# Patient Record
Sex: Female | Born: 1960 | Race: White | Hispanic: No | Smoking: Current every day smoker
Health system: Southern US, Community
[De-identification: ages and names within clinical notes are randomized; demographics above are authoritative.]

## PROBLEM LIST (undated history)

## (undated) DIAGNOSIS — F419 Anxiety disorder, unspecified: Secondary | ICD-10-CM

## (undated) DIAGNOSIS — M5090 Cervical disc disorder, unspecified, unspecified cervical region: Secondary | ICD-10-CM

## (undated) DIAGNOSIS — T7840XA Allergy, unspecified, initial encounter: Secondary | ICD-10-CM

## (undated) DIAGNOSIS — G5600 Carpal tunnel syndrome, unspecified upper limb: Secondary | ICD-10-CM

## (undated) DIAGNOSIS — E785 Hyperlipidemia, unspecified: Secondary | ICD-10-CM

## (undated) DIAGNOSIS — F3281 Premenstrual dysphoric disorder: Secondary | ICD-10-CM

## (undated) DIAGNOSIS — K219 Gastro-esophageal reflux disease without esophagitis: Secondary | ICD-10-CM

## (undated) DIAGNOSIS — I10 Essential (primary) hypertension: Secondary | ICD-10-CM

## (undated) HISTORY — DX: Essential (primary) hypertension: I10

## (undated) HISTORY — DX: Hyperlipidemia, unspecified: E78.5

## (undated) HISTORY — DX: Cervical disc disorder, unspecified, unspecified cervical region: M50.90

## (undated) HISTORY — PX: COLONOSCOPY: SHX174

## (undated) HISTORY — PX: PILONIDAL CYST EXCISION: SHX744

## (undated) HISTORY — DX: Allergy, unspecified, initial encounter: T78.40XA

## (undated) HISTORY — DX: Gastro-esophageal reflux disease without esophagitis: K21.9

## (undated) HISTORY — DX: Premenstrual dysphoric disorder: F32.81

## (undated) HISTORY — DX: Anxiety disorder, unspecified: F41.9

## (undated) HISTORY — PX: SPINE SURGERY: SHX786

## (undated) HISTORY — DX: Carpal tunnel syndrome, unspecified upper limb: G56.00

## (undated) HISTORY — PX: BREAST BIOPSY: SHX20

## (undated) HISTORY — PX: POLYPECTOMY: SHX149

---

## 1989-09-17 HISTORY — PX: CRYOABLATION: SHX1415

## 1998-07-27 ENCOUNTER — Inpatient Hospital Stay (HOSPITAL_COMMUNITY): Admission: AD | Admit: 1998-07-27 | Discharge: 1998-07-29 | Payer: Self-pay | Admitting: *Deleted

## 1998-08-23 ENCOUNTER — Other Ambulatory Visit: Admission: RE | Admit: 1998-08-23 | Discharge: 1998-08-23 | Payer: Self-pay | Admitting: *Deleted

## 1999-12-13 ENCOUNTER — Other Ambulatory Visit: Admission: RE | Admit: 1999-12-13 | Discharge: 1999-12-13 | Payer: Self-pay | Admitting: *Deleted

## 2000-12-18 ENCOUNTER — Other Ambulatory Visit: Admission: RE | Admit: 2000-12-18 | Discharge: 2000-12-18 | Payer: Self-pay | Admitting: *Deleted

## 2002-02-07 ENCOUNTER — Other Ambulatory Visit: Admission: RE | Admit: 2002-02-07 | Discharge: 2002-02-07 | Payer: Self-pay | Admitting: *Deleted

## 2003-03-15 ENCOUNTER — Other Ambulatory Visit: Admission: RE | Admit: 2003-03-15 | Discharge: 2003-03-15 | Payer: Self-pay | Admitting: Internal Medicine

## 2004-11-17 HISTORY — PX: OTHER SURGICAL HISTORY: SHX169

## 2005-01-06 ENCOUNTER — Ambulatory Visit: Payer: Self-pay | Admitting: Family Medicine

## 2005-04-03 ENCOUNTER — Ambulatory Visit: Payer: Self-pay | Admitting: Family Medicine

## 2005-05-07 ENCOUNTER — Ambulatory Visit: Payer: Self-pay | Admitting: Family Medicine

## 2005-05-28 ENCOUNTER — Ambulatory Visit: Payer: Self-pay | Admitting: Internal Medicine

## 2005-06-09 ENCOUNTER — Encounter (INDEPENDENT_AMBULATORY_CARE_PROVIDER_SITE_OTHER): Payer: Self-pay | Admitting: *Deleted

## 2005-06-09 ENCOUNTER — Ambulatory Visit: Payer: Self-pay | Admitting: Internal Medicine

## 2005-06-23 ENCOUNTER — Ambulatory Visit: Payer: Self-pay | Admitting: Family Medicine

## 2005-09-11 ENCOUNTER — Ambulatory Visit: Payer: Self-pay | Admitting: Family Medicine

## 2006-01-27 ENCOUNTER — Ambulatory Visit: Payer: Self-pay | Admitting: Family Medicine

## 2006-02-10 ENCOUNTER — Ambulatory Visit: Payer: Self-pay | Admitting: Family Medicine

## 2006-03-13 ENCOUNTER — Ambulatory Visit: Payer: Self-pay | Admitting: Family Medicine

## 2006-03-27 ENCOUNTER — Ambulatory Visit: Payer: Self-pay | Admitting: Family Medicine

## 2006-06-29 ENCOUNTER — Other Ambulatory Visit: Admission: RE | Admit: 2006-06-29 | Discharge: 2006-06-29 | Payer: Self-pay | Admitting: Family Medicine

## 2006-06-29 ENCOUNTER — Ambulatory Visit: Payer: Self-pay | Admitting: Family Medicine

## 2006-06-29 LAB — CONVERTED CEMR LAB: Pap Smear: NORMAL

## 2006-07-17 ENCOUNTER — Ambulatory Visit: Payer: Self-pay | Admitting: Internal Medicine

## 2006-07-17 ENCOUNTER — Encounter: Payer: Self-pay | Admitting: Internal Medicine

## 2006-07-17 ENCOUNTER — Inpatient Hospital Stay (HOSPITAL_COMMUNITY): Admission: EM | Admit: 2006-07-17 | Discharge: 2006-07-19 | Payer: Self-pay | Admitting: Emergency Medicine

## 2006-07-24 ENCOUNTER — Ambulatory Visit: Payer: Self-pay | Admitting: Family Medicine

## 2006-07-24 ENCOUNTER — Ambulatory Visit: Payer: Self-pay | Admitting: Oncology

## 2006-07-30 LAB — CBC WITH DIFFERENTIAL (CANCER CENTER ONLY)
BASO#: 0.1 10*3/uL (ref 0.0–0.2)
HCT: 39.4 % (ref 34.8–46.6)
HGB: 13.2 g/dL (ref 11.6–15.9)
LYMPH#: 2.3 10*3/uL (ref 0.9–3.3)
LYMPH%: 31.2 % (ref 14.0–48.0)
MCHC: 33.4 g/dL (ref 32.0–36.0)
MCV: 83 fL (ref 81–101)
MONO#: 0.4 10*3/uL (ref 0.1–0.9)
NEUT%: 59.8 % (ref 39.6–80.0)
WBC: 7.4 10*3/uL (ref 3.9–10.0)

## 2006-08-04 LAB — HYPERCOAGULABLE PANEL, COMPREHENSIVE
AntiThromb III Func: 90 % (ref 75–120)
Anticardiolipin IgA: <7 [APL'U] (ref <13)
Anticardiolipin IgG: <7 [GPL'U] (ref <11)
Anticardiolipin IgM: <7 [MPL'U] (ref <10)
Beta-2 Glyco I IgG: <4 U/mL (ref <20)
Beta-2-Glycoprotein I IgA: <4 U/mL (ref <10)
Beta-2-Glycoprotein I IgM: <4 U/mL (ref <10)
PTT Lupus Anticoagulant: 39.2 secs (ref 36.3–48.8)
Protein C, Total: 87 % (ref 63–153)

## 2006-08-04 LAB — COMPREHENSIVE METABOLIC PANEL
ALT: 13 U/L (ref 0–40)
BUN: 8 mg/dL (ref 6–23)
CO2: 24 mEq/L (ref 19–32)
Calcium: 9.2 mg/dL (ref 8.4–10.5)
Chloride: 106 mEq/L (ref 96–112)
Creatinine, Ser: 0.7 mg/dL (ref 0.40–1.20)
Glucose, Bld: 93 mg/dL (ref 70–99)
Total Bilirubin: 0.4 mg/dL (ref 0.3–1.2)

## 2006-08-04 LAB — LACTATE DEHYDROGENASE: LDH: 160 U/L (ref 94–250)

## 2006-08-31 ENCOUNTER — Ambulatory Visit: Payer: Self-pay | Admitting: Family Medicine

## 2006-09-07 ENCOUNTER — Encounter: Admission: RE | Admit: 2006-09-07 | Discharge: 2006-09-07 | Payer: Self-pay | Admitting: Oncology

## 2006-09-11 ENCOUNTER — Ambulatory Visit: Payer: Self-pay | Admitting: Oncology

## 2006-09-22 ENCOUNTER — Ambulatory Visit: Payer: Self-pay | Admitting: Family Medicine

## 2006-09-30 ENCOUNTER — Ambulatory Visit: Payer: Self-pay | Admitting: Family Medicine

## 2006-10-28 ENCOUNTER — Ambulatory Visit: Payer: Self-pay | Admitting: Oncology

## 2006-10-30 ENCOUNTER — Ambulatory Visit: Payer: Self-pay | Admitting: Family Medicine

## 2006-11-09 ENCOUNTER — Ambulatory Visit: Payer: Self-pay | Admitting: Family Medicine

## 2006-12-26 ENCOUNTER — Encounter: Admission: RE | Admit: 2006-12-26 | Discharge: 2006-12-26 | Payer: Self-pay | Admitting: Family Medicine

## 2006-12-29 ENCOUNTER — Ambulatory Visit: Payer: Self-pay | Admitting: Family Medicine

## 2007-01-27 ENCOUNTER — Ambulatory Visit: Payer: Self-pay | Admitting: Oncology

## 2007-03-15 ENCOUNTER — Telehealth (INDEPENDENT_AMBULATORY_CARE_PROVIDER_SITE_OTHER): Payer: Self-pay | Admitting: *Deleted

## 2007-03-22 ENCOUNTER — Ambulatory Visit: Payer: Self-pay | Admitting: Oncology

## 2007-03-23 LAB — BASIC METABOLIC PANEL
CO2: 21 mEq/L (ref 19–32)
Calcium: 8.7 mg/dL (ref 8.4–10.5)
Glucose, Bld: 121 mg/dL — ABNORMAL HIGH (ref 70–99)
Potassium: 4.2 mEq/L (ref 3.5–5.3)
Sodium: 139 mEq/L (ref 135–145)

## 2007-03-25 ENCOUNTER — Encounter: Admission: RE | Admit: 2007-03-25 | Discharge: 2007-03-25 | Payer: Self-pay | Admitting: Oncology

## 2007-04-08 LAB — CBC WITH DIFFERENTIAL (CANCER CENTER ONLY)
BASO#: 0.1 10*3/uL (ref 0.0–0.2)
EOS%: 3 % (ref 0.0–7.0)
HCT: 41.6 % (ref 34.8–46.6)
HGB: 14.1 g/dL (ref 11.6–15.9)
LYMPH%: 37.3 % (ref 14.0–48.0)
MCH: 28.7 pg (ref 26.0–34.0)
MCHC: 34 g/dL (ref 32.0–36.0)
MONO%: 5.7 % (ref 0.0–13.0)
NEUT%: 53.2 % (ref 39.6–80.0)
RDW: 12.5 % (ref 10.5–14.6)

## 2007-08-19 ENCOUNTER — Ambulatory Visit: Payer: Self-pay | Admitting: Family Medicine

## 2007-08-19 DIAGNOSIS — G5600 Carpal tunnel syndrome, unspecified upper limb: Secondary | ICD-10-CM | POA: Insufficient documentation

## 2007-08-19 DIAGNOSIS — K649 Unspecified hemorrhoids: Secondary | ICD-10-CM | POA: Insufficient documentation

## 2007-08-19 DIAGNOSIS — I10 Essential (primary) hypertension: Secondary | ICD-10-CM | POA: Insufficient documentation

## 2007-08-19 DIAGNOSIS — Z87410 Personal history of cervical dysplasia: Secondary | ICD-10-CM | POA: Insufficient documentation

## 2007-08-19 DIAGNOSIS — Z87898 Personal history of other specified conditions: Secondary | ICD-10-CM | POA: Insufficient documentation

## 2007-08-19 DIAGNOSIS — F17200 Nicotine dependence, unspecified, uncomplicated: Secondary | ICD-10-CM | POA: Insufficient documentation

## 2007-08-20 ENCOUNTER — Encounter: Admission: RE | Admit: 2007-08-20 | Discharge: 2007-08-20 | Payer: Self-pay | Admitting: Family Medicine

## 2007-08-24 ENCOUNTER — Encounter: Admission: RE | Admit: 2007-08-24 | Discharge: 2007-08-24 | Payer: Self-pay | Admitting: Family Medicine

## 2007-08-26 ENCOUNTER — Telehealth: Payer: Self-pay | Admitting: Family Medicine

## 2007-09-06 ENCOUNTER — Encounter: Payer: Self-pay | Admitting: Family Medicine

## 2007-10-06 ENCOUNTER — Encounter: Payer: Self-pay | Admitting: Family Medicine

## 2008-01-07 ENCOUNTER — Ambulatory Visit: Payer: Self-pay | Admitting: Family Medicine

## 2008-01-07 ENCOUNTER — Telehealth: Payer: Self-pay | Admitting: Family Medicine

## 2008-01-14 ENCOUNTER — Telehealth: Payer: Self-pay | Admitting: Family Medicine

## 2008-08-15 ENCOUNTER — Ambulatory Visit: Payer: Self-pay | Admitting: Family Medicine

## 2008-10-03 IMAGING — CT CT ABDOMEN W/ CM
3 of 5 series · 17 of 46 positions shown, 19 images · IV contrast (READICAT/WATER & [ID] OMNI 300)
Comparison: 09/07/06.

CLINICAL DATA: History of splenic infarcts upper abdominal pain.  
ABDOMEN CT WITH CONTRAST:
TECHNIQUE: Multidetector CT imaging of the abdomen was performed following the standard protocol during bolus administration of intravenous contrast.
Contrast:  cc Omnipaque 300

[Series 2: abdomen w/ · axial · 0.77mm/px · z∈[-279,-24]mm · 12 of 61 slices shown, 14 images]
[im 5/61  soft-tissue]
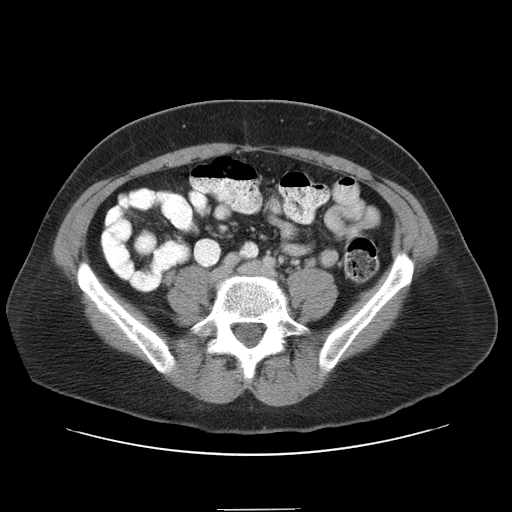
[im 5/61  bone]
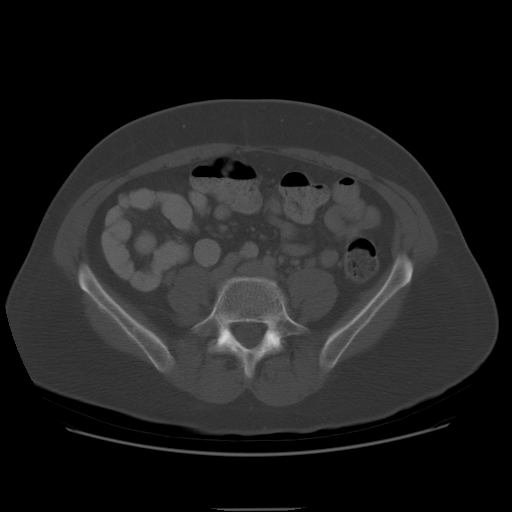
[im 10/61  soft-tissue]
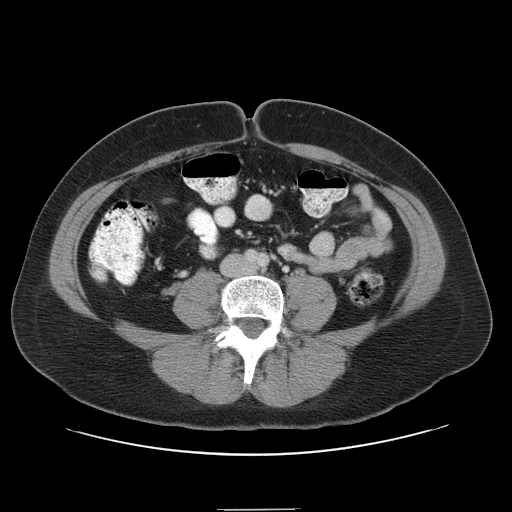
[im 14/61  soft-tissue]
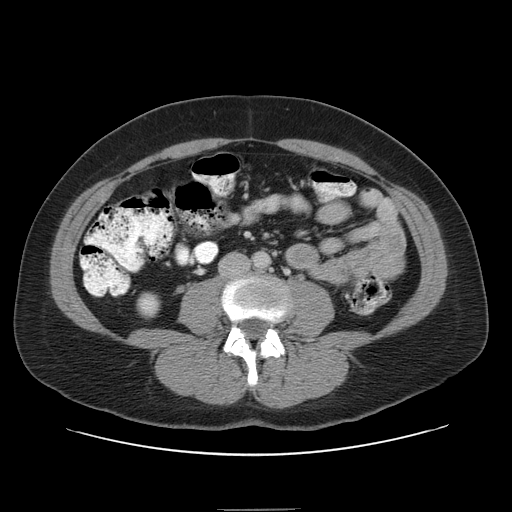
[im 19/61  soft-tissue]
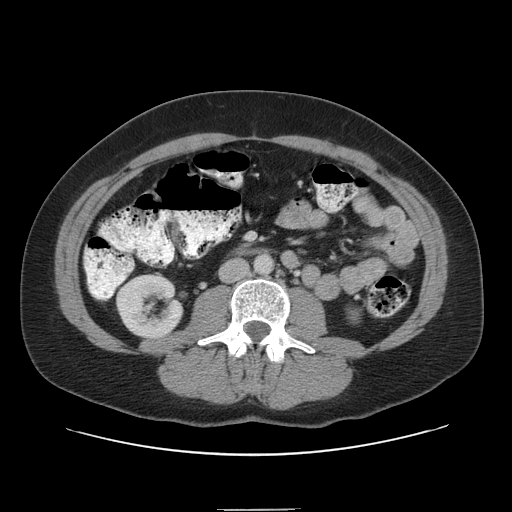
[im 24/61  soft-tissue]
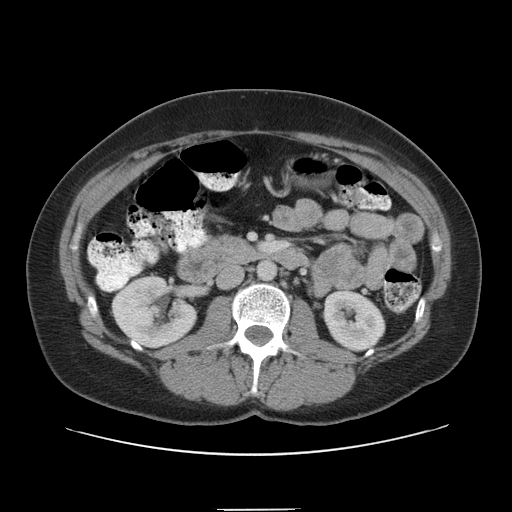
[im 28/61  soft-tissue]
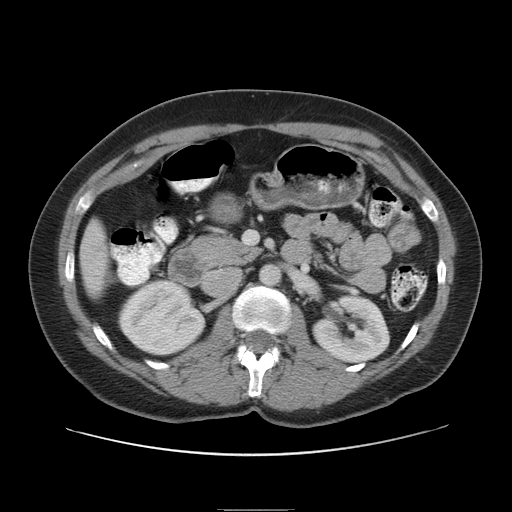
[im 33/61  soft-tissue]
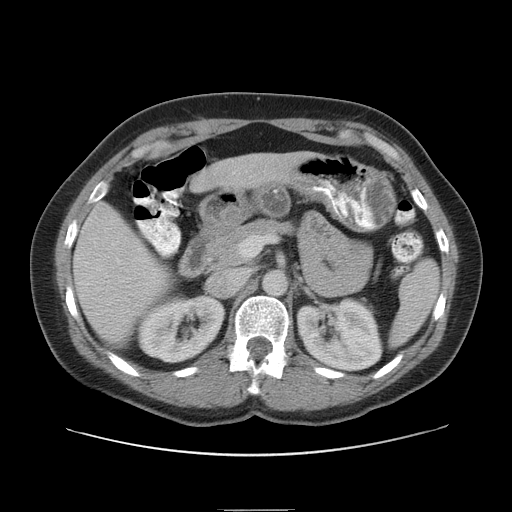
[im 37/61  soft-tissue]
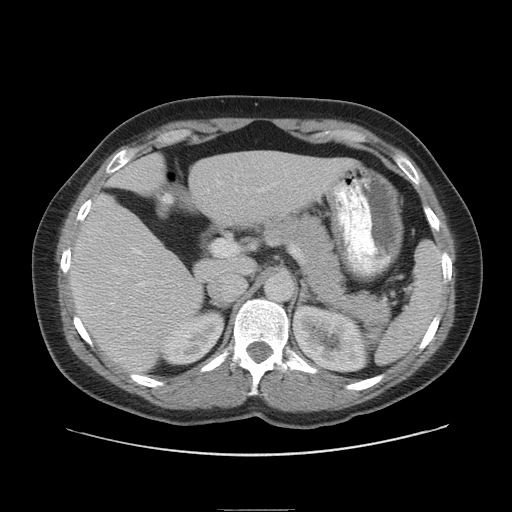
[im 42/61  soft-tissue]
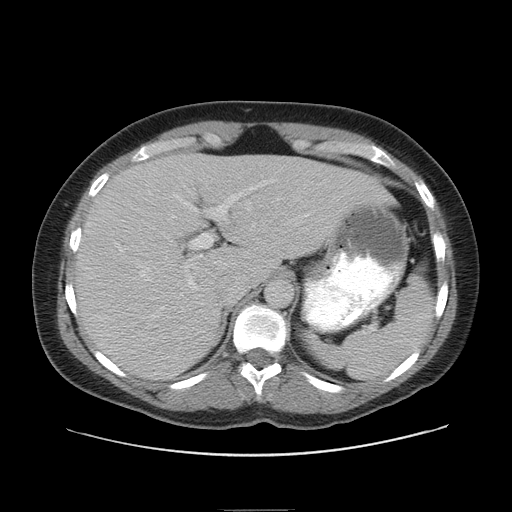
[im 42/61  bone]
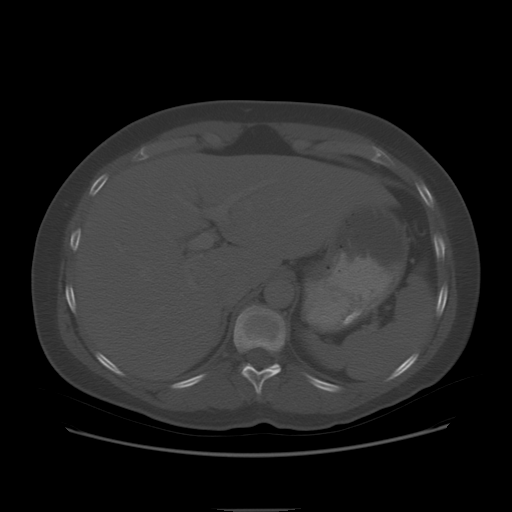
[im 47/61  soft-tissue]
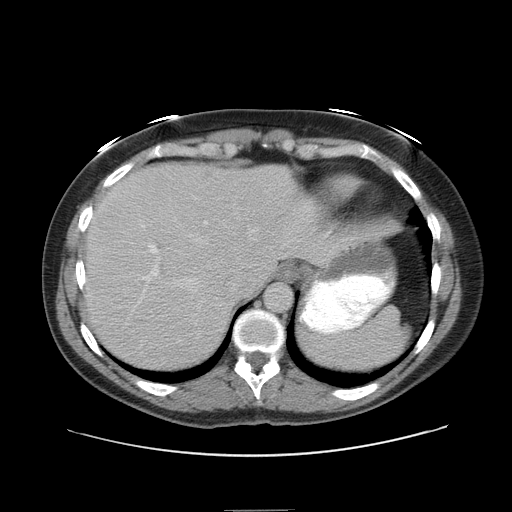
[im 51/61  soft-tissue]
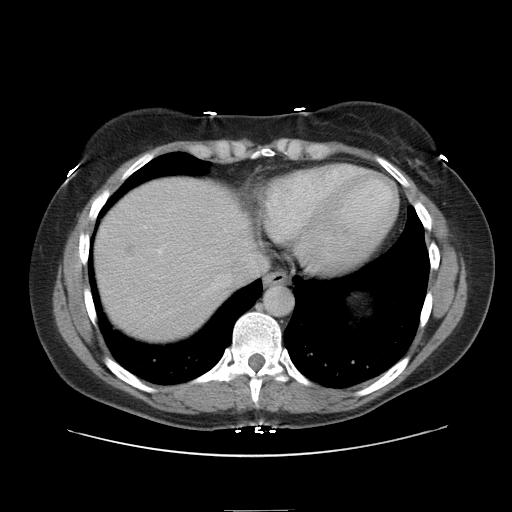
[im 56/61  soft-tissue]
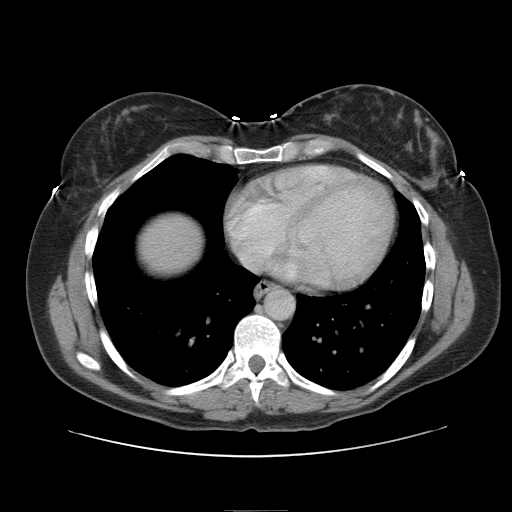

[Series 3: lung windows · axial · 0.77mm/px · z∈[-89,-59]mm · 2 of 24 slices shown]
[im 6/24  bone]
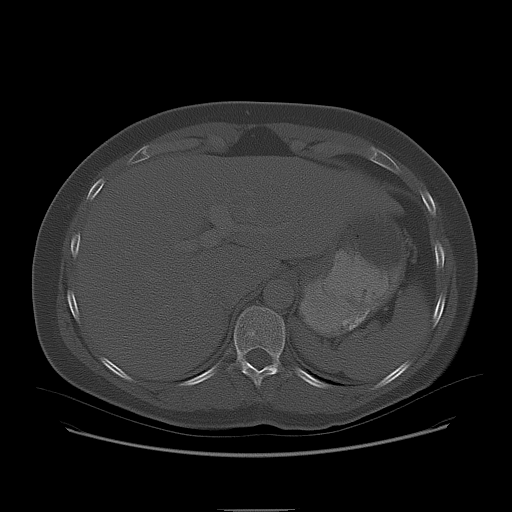
[im 12/24  bone]
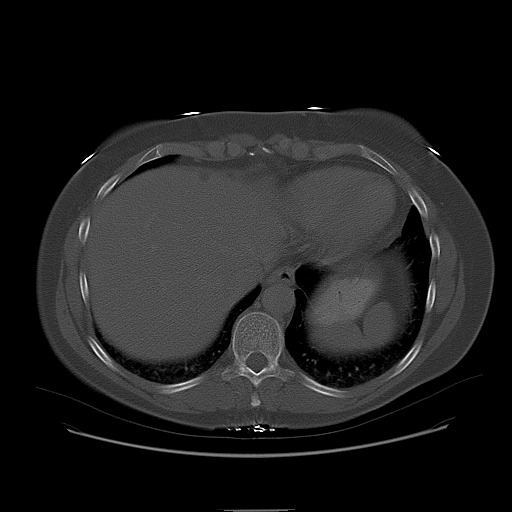

[Series 401: coronals · coronal · 0.77mm/px · 3 of 106 slices shown]
[im 36/106  soft-tissue]
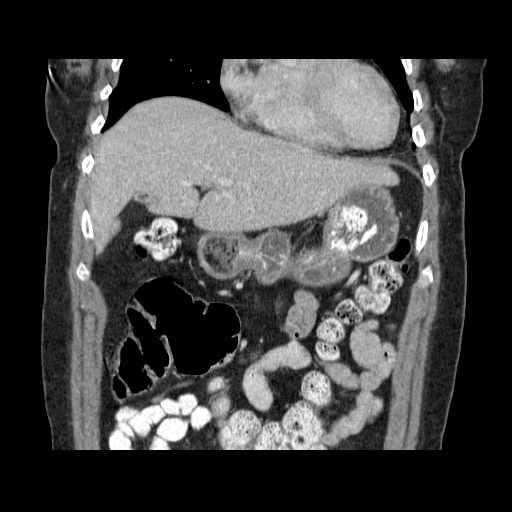
[im 47/106  soft-tissue]
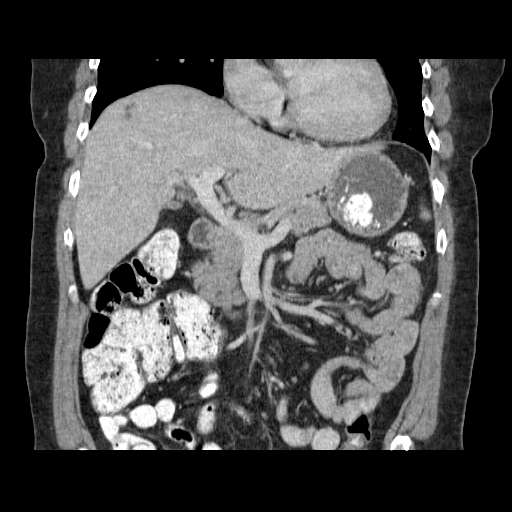
[im 59/106  soft-tissue]
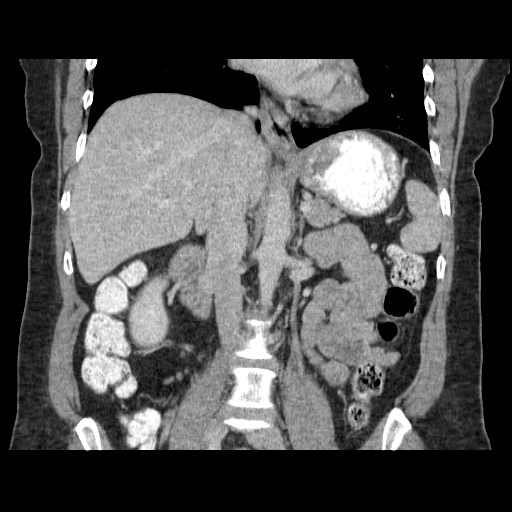

[17 of 46 positions shown; findings below may reference images not displayed]

FINDINGS: Again appreciated are findings consistent with infarction involving the posterior aspect of the spleen and also anterior tip of spleen.  There are findings compatible with subtle scarring of the spleen at that site.  No evidence of an acute infarction.  There are a few small liver cysts otherwise the liver appears unremarkable.  Pancreas, kidneys, and adrenal glands are unremarkable.  No mesenteric or retroperitoneal abnormality.  No free fluid.
IMPRESSION: Findings consistent with remote splenic infarction.  No acute abdominal abnormality.

## 2008-11-07 ENCOUNTER — Ambulatory Visit: Payer: Self-pay | Admitting: Family Medicine

## 2008-11-07 ENCOUNTER — Encounter: Payer: Self-pay | Admitting: Family Medicine

## 2008-11-13 ENCOUNTER — Telehealth: Payer: Self-pay | Admitting: Family Medicine

## 2008-11-16 ENCOUNTER — Encounter (INDEPENDENT_AMBULATORY_CARE_PROVIDER_SITE_OTHER): Payer: Self-pay | Admitting: *Deleted

## 2008-11-22 ENCOUNTER — Telehealth (INDEPENDENT_AMBULATORY_CARE_PROVIDER_SITE_OTHER): Payer: Self-pay | Admitting: Internal Medicine

## 2008-11-23 ENCOUNTER — Ambulatory Visit: Payer: Self-pay | Admitting: Family Medicine

## 2008-11-24 LAB — CONVERTED CEMR LAB
Basophils Absolute: 0.1 10*3/uL (ref 0.0–0.1)
Basophils Relative: 0.5 % (ref 0.0–3.0)
Hemoglobin: 14 g/dL (ref 12.0–15.0)
Lymphocytes Relative: 16.5 % (ref 12.0–46.0)
Monocytes Relative: 4.6 % (ref 3.0–12.0)
Neutro Abs: 7.7 10*3/uL (ref 1.4–7.7)
Neutrophils Relative %: 76 % (ref 43.0–77.0)
RBC: 4.53 M/uL (ref 3.87–5.11)

## 2008-11-29 ENCOUNTER — Ambulatory Visit: Payer: Self-pay | Admitting: Family Medicine

## 2008-11-29 ENCOUNTER — Encounter: Payer: Self-pay | Admitting: Family Medicine

## 2008-11-29 ENCOUNTER — Other Ambulatory Visit: Admission: RE | Admit: 2008-11-29 | Discharge: 2008-11-29 | Payer: Self-pay | Admitting: Family Medicine

## 2008-12-01 ENCOUNTER — Encounter (INDEPENDENT_AMBULATORY_CARE_PROVIDER_SITE_OTHER): Payer: Self-pay | Admitting: *Deleted

## 2008-12-04 ENCOUNTER — Encounter: Payer: Self-pay | Admitting: Family Medicine

## 2008-12-28 ENCOUNTER — Encounter: Payer: Self-pay | Admitting: Family Medicine

## 2009-03-05 ENCOUNTER — Ambulatory Visit: Payer: Self-pay | Admitting: Family Medicine

## 2009-03-06 LAB — CONVERTED CEMR LAB
ALT: 15 units/L (ref 0–35)
AST: 14 units/L (ref 0–37)
Alkaline Phosphatase: 79 units/L (ref 39–117)
BUN: 12 mg/dL (ref 6–23)
Basophils Relative: 0.1 % (ref 0.0–3.0)
Bilirubin, Direct: 0 mg/dL (ref 0.0–0.3)
Chloride: 108 meq/L (ref 96–112)
Cholesterol: 256 mg/dL — ABNORMAL HIGH (ref 0–200)
Creatinine, Ser: 0.6 mg/dL (ref 0.4–1.2)
Eosinophils Relative: 4.1 % (ref 0.0–5.0)
GFR calc non Af Amer: 113.26 mL/min (ref >60)
HDL: 33.4 mg/dL — ABNORMAL LOW (ref >39.00)
MCV: 88.9 fL (ref 78.0–100.0)
Monocytes Relative: 7.9 % (ref 3.0–12.0)
Neutrophils Relative %: 48.3 % (ref 43.0–77.0)
Platelets: 231 10*3/uL (ref 150.0–400.0)
Potassium: 4.5 meq/L (ref 3.5–5.1)
RBC: 4.89 M/uL (ref 3.87–5.11)
Total Bilirubin: 0.7 mg/dL (ref 0.3–1.2)
Total CHOL/HDL Ratio: 8
Total Protein: 6.8 g/dL (ref 6.0–8.3)
VLDL: 20.6 mg/dL (ref 0.0–40.0)
WBC: 5.7 10*3/uL (ref 4.5–10.5)

## 2009-03-19 ENCOUNTER — Telehealth (INDEPENDENT_AMBULATORY_CARE_PROVIDER_SITE_OTHER): Payer: Self-pay | Admitting: *Deleted

## 2009-05-10 ENCOUNTER — Ambulatory Visit: Payer: Self-pay | Admitting: Family Medicine

## 2009-05-10 DIAGNOSIS — E7800 Pure hypercholesterolemia, unspecified: Secondary | ICD-10-CM | POA: Insufficient documentation

## 2009-05-13 LAB — CONVERTED CEMR LAB
ALT: 13 units/L (ref 0–35)
AST: 14 units/L (ref 0–37)
Direct LDL: 185 mg/dL
HDL: 32.1 mg/dL — ABNORMAL LOW (ref >39.00)
Total CHOL/HDL Ratio: 7
VLDL: 25 mg/dL (ref 0.0–40.0)

## 2009-05-16 ENCOUNTER — Ambulatory Visit: Payer: Self-pay | Admitting: Family Medicine

## 2009-06-28 ENCOUNTER — Ambulatory Visit: Payer: Self-pay | Admitting: Family Medicine

## 2009-06-28 ENCOUNTER — Telehealth: Payer: Self-pay | Admitting: Family Medicine

## 2009-06-28 LAB — CONVERTED CEMR LAB
ALT: 15 units/L (ref 0–35)
AST: 13 units/L (ref 0–37)
HDL: 29.2 mg/dL — ABNORMAL LOW (ref >39.00)
Total CHOL/HDL Ratio: 6

## 2009-07-13 ENCOUNTER — Telehealth: Payer: Self-pay | Admitting: Family Medicine

## 2009-08-06 ENCOUNTER — Ambulatory Visit: Payer: Self-pay | Admitting: Family Medicine

## 2009-08-07 ENCOUNTER — Encounter: Payer: Self-pay | Admitting: Family Medicine

## 2009-08-08 ENCOUNTER — Telehealth: Payer: Self-pay | Admitting: Family Medicine

## 2009-08-10 ENCOUNTER — Telehealth: Payer: Self-pay | Admitting: Family Medicine

## 2009-09-14 ENCOUNTER — Ambulatory Visit: Payer: Self-pay | Admitting: Family Medicine

## 2009-09-14 DIAGNOSIS — M5020 Other cervical disc displacement, unspecified cervical region: Secondary | ICD-10-CM | POA: Insufficient documentation

## 2009-11-15 ENCOUNTER — Telehealth: Payer: Self-pay | Admitting: Family Medicine

## 2009-12-12 ENCOUNTER — Encounter: Admission: RE | Admit: 2009-12-12 | Discharge: 2009-12-12 | Payer: Self-pay | Admitting: Chiropractic Medicine

## 2009-12-21 ENCOUNTER — Encounter: Payer: Self-pay | Admitting: Family Medicine

## 2010-01-01 ENCOUNTER — Ambulatory Visit (HOSPITAL_COMMUNITY): Admission: RE | Admit: 2010-01-01 | Discharge: 2010-01-01 | Payer: Self-pay | Admitting: Neurosurgery

## 2010-01-15 HISTORY — PX: SPINE SURGERY: SHX786

## 2010-01-23 ENCOUNTER — Encounter: Payer: Self-pay | Admitting: Family Medicine

## 2010-02-15 ENCOUNTER — Encounter: Payer: Self-pay | Admitting: Family Medicine

## 2010-03-15 ENCOUNTER — Encounter: Payer: Self-pay | Admitting: Family Medicine

## 2010-03-25 ENCOUNTER — Encounter: Payer: Self-pay | Admitting: Family Medicine

## 2010-07-12 ENCOUNTER — Encounter: Payer: Self-pay | Admitting: Family Medicine

## 2010-10-31 ENCOUNTER — Ambulatory Visit: Payer: Self-pay | Admitting: Family Medicine

## 2010-12-10 ENCOUNTER — Telehealth (INDEPENDENT_AMBULATORY_CARE_PROVIDER_SITE_OTHER): Payer: Self-pay | Admitting: *Deleted

## 2010-12-11 ENCOUNTER — Other Ambulatory Visit: Payer: Self-pay | Admitting: Family Medicine

## 2010-12-11 ENCOUNTER — Ambulatory Visit
Admission: RE | Admit: 2010-12-11 | Discharge: 2010-12-11 | Payer: Self-pay | Source: Home / Self Care | Attending: Family Medicine | Admitting: Family Medicine

## 2010-12-11 LAB — TSH: TSH: 0.91 u[IU]/mL (ref 0.35–5.50)

## 2010-12-11 LAB — CBC WITH DIFFERENTIAL/PLATELET
Basophils Absolute: 0.1 10*3/uL (ref 0.0–0.1)
Eosinophils Absolute: 0.3 10*3/uL (ref 0.0–0.7)
Eosinophils Relative: 4.8 % (ref 0.0–5.0)
Lymphs Abs: 2.1 10*3/uL (ref 0.7–4.0)
MCHC: 35.1 g/dL (ref 30.0–36.0)
MCV: 92.4 fl (ref 78.0–100.0)
Monocytes Absolute: 0.4 10*3/uL (ref 0.1–1.0)
Neutrophils Relative %: 51.4 % (ref 43.0–77.0)
Platelets: 263 10*3/uL (ref 150.0–400.0)
RDW: 13.5 % (ref 11.5–14.6)
WBC: 5.9 10*3/uL (ref 4.5–10.5)

## 2010-12-11 LAB — LIPID PANEL
Cholesterol: 270 mg/dL — ABNORMAL HIGH (ref 0–200)
VLDL: 30.4 mg/dL (ref 0.0–40.0)

## 2010-12-11 LAB — BASIC METABOLIC PANEL
BUN: 14 mg/dL (ref 6–23)
Chloride: 106 mEq/L (ref 96–112)
Creatinine, Ser: 0.6 mg/dL (ref 0.4–1.2)
Glucose, Bld: 82 mg/dL (ref 70–99)

## 2010-12-11 LAB — HEPATIC FUNCTION PANEL
Bilirubin, Direct: 0.1 mg/dL (ref 0.0–0.3)
Total Bilirubin: 0.6 mg/dL (ref 0.3–1.2)

## 2010-12-11 LAB — LDL CHOLESTEROL, DIRECT: Direct LDL: 217.5 mg/dL

## 2010-12-16 ENCOUNTER — Other Ambulatory Visit (HOSPITAL_COMMUNITY)
Admission: RE | Admit: 2010-12-16 | Discharge: 2010-12-16 | Disposition: A | Discharge status: Home / Self Care | Payer: BC Managed Care – PPO | Source: Ambulatory Visit | Attending: Family Medicine | Admitting: Family Medicine

## 2010-12-16 ENCOUNTER — Other Ambulatory Visit: Payer: Self-pay | Admitting: Family Medicine

## 2010-12-16 ENCOUNTER — Ambulatory Visit
Admission: RE | Admit: 2010-12-16 | Discharge: 2010-12-16 | Payer: Self-pay | Source: Home / Self Care | Attending: Family Medicine | Admitting: Family Medicine

## 2010-12-16 DIAGNOSIS — Z124 Encounter for screening for malignant neoplasm of cervix: Secondary | ICD-10-CM | POA: Insufficient documentation

## 2010-12-16 DIAGNOSIS — Z1159 Encounter for screening for other viral diseases: Secondary | ICD-10-CM | POA: Insufficient documentation

## 2010-12-16 LAB — CYTOLOGY - PAP: Pap Smear: NORMAL

## 2010-12-16 LAB — HM PAP SMEAR

## 2010-12-17 ENCOUNTER — Telehealth: Payer: Self-pay | Admitting: Family Medicine

## 2010-12-17 NOTE — Letter (Signed)
Summary: Dr.James Hirsch,[facility]  Dr.James Hirsch,[facility]   Imported By: Beau Fanny 02/05/2010 16:10:49  _____________________________________________________________________  External Attachment:    Type:   Image     Comment:   External Document  Appended Document: Dr.James Hirsch,[facility]     Clinical Lists Changes  Observations: Added new observation of PAST MED HX: Hypertension smoker PMDD migraine  carpal tunnel strong family hx of breast cancer cervical disc dz (02/07/2010 17:42) Added new observation of PAST SURG HX: Pilonidal cysts x 2 (1980's) 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 etiology (06/2006)    Neg nerve work up for above (09/2006)    3/11 cervical spine surgery

## 2010-12-17 NOTE — Letter (Signed)
Summary: Results Follow up Letter  [place] at [facility]  [address] [place], [place] [address]   Phone: [phone]  Fax: [phone]    03/25/2010 MRN: [id number]      Carolyn Cordova [address] [place], [place]  [address]    Dear Ms. Borrero,  The following are the results of your recent test(s):  Test         Result    Pap Smear:        Normal _____  Not Normal _____ Comments: ______________________________________________________ Cholesterol: LDL(Bad cholesterol):         Your goal is less than:         HDL (Good cholesterol):       Your goal is more than: Comments:  ______________________________________________________ Mammogram:        Normal __x___  Not Normal _____ Comments:Repeat in 1 year  ___________________________________________________________________ Hemoccult:        Normal _____  Not normal _______ Comments:    _____________________________________________________________________ Other Tests:    We routinely do not discuss normal results over the telephone.  If you desire a copy of the results, or you have any questions about this information we can discuss them at your next office visit.   Sincerely,  Roxy Manns MD

## 2010-12-17 NOTE — Letter (Signed)
Summary: [facility]  [facility]   Imported By: Lanelle Bal 07/30/2010 13:08:56  _____________________________________________________________________  External Attachment:    Type:   Image     Comment:   External Document

## 2010-12-17 NOTE — Consult Note (Signed)
Summary: [facility]  [facility]   Imported By: Lanelle Bal 12/31/2009 11:05:46  _____________________________________________________________________  External Attachment:    Type:   Image     Comment:   External Document

## 2010-12-17 NOTE — Letter (Signed)
Summary: [facility]  [facility]   Imported By: Lanelle Bal 02/27/2010 12:51:09  _____________________________________________________________________  External Attachment:    Type:   Image     Comment:   External Document
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
## 2010-12-19 NOTE — Progress Notes (Signed)
----   Converted from flag ---- ---- 12/08/2010 10:03 AM, Colon Flattery Tower MD wrote: please check wellness/ lipid vor v70.0 and 272 and 401.1 thanks   ---- 12/06/2010 11:41 AM, Liane Comber CMA (AAMA) wrote: Lab orders please! Good Morning! This pt is scheduled for cpx labs Wed, which labs to draw and dx codes to use? Thanks Tasha ------------------------------

## 2010-12-24 ENCOUNTER — Encounter: Payer: Self-pay | Admitting: Family Medicine

## 2010-12-25 NOTE — Assessment & Plan Note (Signed)
Summary: CPX/CLE   Vital Signs:  Patient profile:   50 year old female Height:      68.5 inches Weight:      184.50 pounds BMI:     27.75 Temp:     98.6 degrees F oral Pulse rate:   80 / minute Pulse rhythm:   regular BP sitting:   130 / 84  (left arm) Cuff size:   large  Vitals Entered By: Lewanda Rife LPN (December 16, 2010 9:44 AM) CC: CPX wants pap and breast exam LMP 06/2010   History of Present Illness: here for health mt exam and to disc chronic health problems  has been doing fine overall  living day to day  kids are doing good and well in school   nothing new medically  does suffer from constipation -- is on diclofenac  has some orthotics - will be able to stop the nsaid son    wt  is up 2 lb  HTN in fair control 130/84 today   pap nl 1/10 hx of dysplasia in past  2 peroids in 2011 -- pretty much stopping  stays quite warm - tolerating it ok    mam 4/11 - nl  had a mammogram last year  self exam -- no lumps   colonosc 06- hyperpl polyp- rec f/u 2012  Td 04 did not get flu shot this year --will get one  has had pneumovax  chol way up with trig 152 and HDL 37 and LDL 217 went on zocor and had leg and arm pain  diet is really good - lots of salad and fruit and no fried items - is good    smoking status -- is not ready to quit yet  it has to become a priority for her - is not yet   stress with chronically ill husband     Allergies: 1)  ! * Chantix 2)  ! 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 Eyes:  Denies blurring and eye irritation. CV:  Denies chest pain or discomfort, palpitations, and shortness of breath with exertion. Resp:  Denies cough, shortness of breath, and wheezing. GI:  Denies abdominal pain, change in bowel habits, indigestion, and nausea. MS:  Complains of joint pain; denies muscle aches, cramps, and muscle weakness. Derm:  Denies itching, lesion(s), poor wound healing, and rash. Neuro:  Denies numbness and tingling. Psych:  Denies sense of great danger and suicidal thoughts/plans; very stressed in general but fairly used to it  this stress keeps her from taking good care of  herself. Endo:  Denies cold intolerance, excessive thirst, excessive urination, and heat intolerance. Heme:  Denies abnormal bruising and bleeding.  Physical Exam  General:  mildly overwt and well appearing  Head:  normocephalic, atraumatic, and no abnormalities observed.   Eyes:  vision grossly intact, pupils equal, pupils round, and pupils reactive to light.  no conjunctival pallor, injection or icterus  Ears:  R ear normal and L ear normal.   Nose:  no nasal discharge.   Mouth:  pharynx pink and moist.   Neck:  supple with full rom and no masses or thyromegally, no JVD or carotid bruit  Chest Wall:  No deformities, masses, or tenderness noted. Breasts:  No mass, nodules, thickening, tenderness, bulging, retraction, inflamation,  nipple discharge or skin changes noted.   Lungs:  diffusely distant bs with good air exch and no wheeze Heart:  Normal rate and regular rhythm. S1 and S2 normal without gallop, murmur, click, rub or other extra sounds. Abdomen:  Bowel sounds positive,abdomen soft and non-tender without masses, organomegaly or hernias noted. no renal bruits  Genitalia:  Normal introitus for age, no external lesions, no vaginal discharge, mucosa pink and moist, no vaginal or cervical lesions, no vaginal atrophy, no friaility or hemorrhage, normal uterus size and position, no adnexal masses or tenderness Msk:  No deformity or scoliosis noted of thoracic or lumbar spine.  no acute joint changes  Pulses:  R and L carotid,radial,femoral,dorsalis pedis and posterior tibial pulses are full and equal bilaterally Extremities:  No clubbing, cyanosis, edema, or deformity noted with normal full range of motion of all joints.   Neurologic:  sensation intact to light touch, gait normal, and DTRs symmetrical and normal.   Skin:  Intact without suspicious lesions or rashes Cervical Nodes:  No lymphadenopathy noted Axillary Nodes:  No palpable lymphadenopathy Inguinal Nodes:  No significant adenopathy Psych:  normal affect, talkative and pleasant    Impression & Recommendations:  Problem # 1:  HEALTH MAINTENANCE EXAM (ICD-V70.0) Assessment Comment Only reviewed health habits including diet, exercise and skin cancer prevention reviewed health maintenance list and family history  rev wellness lab in detail enc to continue exercise and quit smoking   Problem # 2:  ROUTINE GYNECOLOGICAL EXAMINATION (ICD-V72.31) Assessment: Comment Only annual exam remote hx of dysplasia  is menopausal  Problem # 3:  PURE HYPERCHOLESTEROLEMIA (ICD-272.0) Assessment: Deteriorated  needs to try another statin  will call ins about coverage and let me know  rev labs with pt  rev low sat fat diet with pt   Labs Reviewed: SGOT: 15  (12/11/2010)   SGPT: 20 (12/11/2010)   HDL:37.30 (12/11/2010), 29.20 (06/28/2009)  LDL:108 (06/28/2009)  Chol:270 (12/11/2010), 166 (06/28/2009)  Trig:152.0 (12/11/2010), 146.0 (06/28/2009)  Problem # 4:  Hx of TOBACCO ABUSE (ICD-305.1) Assessment: Unchanged discussed in detail risks of smoking, and possible outcomes including COPD, vascular dz, cancer and also respiratory infections/sinus problems   pt not ready to quit but voiced understanding  Problem # 5:  HYPERTENSION (ICD-401.9) Assessment: Unchanged  controlled with norvasc and exercise med refilled disc healthy diet (low simple sugar/ choose complex carbs/ low sat fat) diet and exercise in detail  Her updated medication list for this problem includes:    Norvasc 5 Mg Tabs (Amlodipine besylate) .Marland Kitchen... Take 1 tablet by mouth once a day  BP today: 130/84 Prior BP: 160/100 (10/31/2010)  Labs Reviewed: K+: 4.2 (12/11/2010) Creat: : 0.6 (12/11/2010)   Chol: 270 (12/11/2010)   HDL: 37.30 (12/11/2010)   LDL:  108 (06/28/2009)   TG: 152.0 (12/11/2010)  Complete Medication List: 1)  Norvasc 5 Mg Tabs (Amlodipine besylate) .... Take 1 tablet by mouth once a day 2)  Adult Aspirin Low Strength 81 Mg Tbdp (Aspirin) .... One by mouth once daily 3)  Flonase 50 Mcg/act Susp (Fluticasone propionate) .... Use as directed as needed 4)  Zyrtec Allergy 10 Mg Caps (Cetirizine hcl) .... Take 1 tablet by mouth once a day 5)  Diclofenac Sodium 75 Mg Tbec (Diclofenac sodium) .... Take 1 tablet by mouth once a day  Patient Instructions: 1)  your mammogram will be due in may  2)  you can call us to refer you  3)  you will be due colonoscopy in 8/12 -- you can also call us to schedule  4)  it is not too late to get a flu shot at a pharmacy -- you need one  5)  please call your insurance to see what statins are affordible for you -- my first choice is crestor -- then lipitor is my second choice  6)  please call me back and leave message  7)  you can  raise your HDL (good cholesterol) by increasing exercise and eating omega 3 fatty acid supplement like fish oil or flax seed oil over the counter 8)  you can lower LDL (bad cholesterol) by limiting saturated fats in diet like red meat, fried foods, egg yolks, fatty breakfast meats, high fat dairy products and shellfish  Prescriptions: NORVASC 5 MG TABS (AMLODIPINE BESYLATE) Take 1 tablet by mouth once a day  #90 x 3   Entered and Authorized by:   Judith Part MD   Signed by:   Judith Part MD on 12/16/2010   Method used:   Print then Give to Patient   RxID:   [id number]    Orders Added: 1)  Est. Patient 40-64 years [99396]    Current Allergies (reviewed today): ! * CHANTIX ! AUGMENTIN CODEINE Joyce Copa ZOLOFT  Preventive Care Screening  Mammogram:    Date:  03/15/2010    Results:  normal

## 2010-12-25 NOTE — Progress Notes (Signed)
Summary: lipitor / crestor  Phone Note Call from Patient Call back at Home Phone [phone]   Caller: Patient Call For: Carolyn Part MD Summary of Call: Patient says that she can get either crestor or lipitor with her insurance. She says that she can get a 90 day supply of lipitor for $10 which is cheaper than the crestor, but she says that she is willing to take which ever one you prefer. Uses [facility].  Initial call taken by: Melody Comas,  December 17, 2010 9:17 AM  Follow-up for Phone Call        I would prefer crestor if she can afford it  px written on EMR for call in - crestor 10  if any side eff please stop med and call  schedule fasting lab in 6 wk lipid/ast/alt 272 try to eat low sat fat diet  Follow-up by: Carolyn Part MD,  December 17, 2010 10:38 AM  Additional Follow-up for Phone Call Additional follow up Details #1::        Left message for patient to call back. Med sent electronically to [facility] pharmacy as instructed.Lewanda Rife LPN  December 18, 2010 9:25 AM   Advised pt, she will call back to schedule lab appt when she has her schedule. Additional Follow-up by: Lowella Petties CMA, AAMA,  December 18, 2010 9:34 AM    New/Updated Medications: CRESTOR 10 MG TABS (ROSUVASTATIN CALCIUM) 1 by mouth once daily Prescriptions: CRESTOR 10 MG TABS (ROSUVASTATIN CALCIUM) 1 by mouth once daily  #30 x 1   Entered by:   Lewanda Rife LPN   Authorized by:   Carolyn Part MD   Signed by:   Lewanda Rife LPN on 09/81/1914   Method used:   Electronically to        [facility]* (retail)       [address]       [place], [place]  [address]       Ph: [phone]       Fax: [phone]   RxID:   [id number]

## 2011-01-02 NOTE — Letter (Signed)
Summary: Results Follow up Letter  [place] [facility]  [address] [place], [place] [address]   Phone: [phone]  Fax: [phone]    12/24/2010 MRN: [id number]    Carolyn Cordova [address] [place], [place]  [address]    Dear Ms. Sadlon,  The following are the results of your recent test(s):  Test         Result    Pap Smear:        Normal _X____  Not Normal _____ Comments: ______________________________________________________ Cholesterol: LDL(Bad cholesterol):         Your goal is less than:         HDL (Good cholesterol):       Your goal is more than: Comments:  ______________________________________________________ Mammogram:        Normal _____  Not Normal _____ Comments:  ___________________________________________________________________ Hemoccult:        Normal _____  Not normal _______ Comments:    _____________________________________________________________________ Other Tests:    We routinely do not discuss normal results over the telephone.  If you desire a copy of the results, or you have any questions about this information we can discuss them at your next office visit.   Sincerely,    Idamae Schuller Meosha Castanon,MD  MT/ri

## 2011-01-23 ENCOUNTER — Telehealth (INDEPENDENT_AMBULATORY_CARE_PROVIDER_SITE_OTHER): Payer: Self-pay | Admitting: *Deleted

## 2011-01-28 ENCOUNTER — Encounter (INDEPENDENT_AMBULATORY_CARE_PROVIDER_SITE_OTHER): Payer: Self-pay | Admitting: *Deleted

## 2011-01-28 ENCOUNTER — Other Ambulatory Visit: Payer: Self-pay | Admitting: Family Medicine

## 2011-01-28 ENCOUNTER — Other Ambulatory Visit (INDEPENDENT_AMBULATORY_CARE_PROVIDER_SITE_OTHER): Payer: BC Managed Care – PPO

## 2011-01-28 DIAGNOSIS — Z0000 Encounter for general adult medical examination without abnormal findings: Secondary | ICD-10-CM

## 2011-01-28 DIAGNOSIS — E7800 Pure hypercholesterolemia, unspecified: Secondary | ICD-10-CM

## 2011-01-28 LAB — CBC WITH DIFFERENTIAL/PLATELET
Basophils Absolute: 0 10*3/uL (ref 0.0–0.1)
HCT: 44.9 % (ref 36.0–46.0)
Hemoglobin: 15.5 g/dL — ABNORMAL HIGH (ref 12.0–15.0)
Lymphs Abs: 1.6 10*3/uL (ref 0.7–4.0)
MCHC: 34.5 g/dL (ref 30.0–36.0)
MCV: 92.4 fl (ref 78.0–100.0)
Monocytes Absolute: 0.7 10*3/uL (ref 0.1–1.0)
Neutro Abs: 4.4 10*3/uL (ref 1.4–7.7)
Platelets: 201 10*3/uL (ref 150.0–400.0)
RDW: 13 % (ref 11.5–14.6)

## 2011-01-28 LAB — BASIC METABOLIC PANEL
Chloride: 103 mEq/L (ref 96–112)
GFR: 83.02 mL/min (ref >60.00)
Glucose, Bld: 90 mg/dL (ref 70–99)
Potassium: 3.6 mEq/L (ref 3.5–5.1)
Sodium: 141 mEq/L (ref 135–145)

## 2011-01-28 LAB — HEPATIC FUNCTION PANEL
ALT: 21 U/L (ref 0–35)
Albumin: 4.3 g/dL (ref 3.5–5.2)
Total Bilirubin: 0.6 mg/dL (ref 0.3–1.2)

## 2011-01-28 LAB — TSH: TSH: 0.78 u[IU]/mL (ref 0.35–5.50)

## 2011-01-28 LAB — LIPID PANEL: VLDL: 19.6 mg/dL (ref 0.0–40.0)

## 2011-01-28 NOTE — Progress Notes (Signed)
----   Converted from flag ---- ---- 01/23/2011 9:56 AM, Judith Part MD wrote: please check wellness and lipid v70.0 and 272   ---- 01/23/2011 8:13 AM, Liane Comber CMA (AAMA) wrote: Lab orders please! Good Morning! This pt is scheduled for cpx labs Tuesday, which labs to draw and dx codes to use? Thanks Tasha ------------------------------

## 2011-02-05 LAB — URINALYSIS, ROUTINE W REFLEX MICROSCOPIC
Bilirubin Urine: NEGATIVE
Glucose, UA: NEGATIVE mg/dL
Ketones, ur: NEGATIVE mg/dL
Protein, ur: NEGATIVE mg/dL
pH: 6.5 (ref 5.0–8.0)

## 2011-02-05 LAB — BASIC METABOLIC PANEL
BUN: 14 mg/dL (ref 6–23)
Chloride: 102 mEq/L (ref 96–112)
Creatinine, Ser: 0.66 mg/dL (ref 0.4–1.2)
GFR calc Af Amer: >60 mL/min (ref >60)
GFR calc non Af Amer: >60 mL/min (ref >60)
Potassium: 3.8 mEq/L (ref 3.5–5.1)

## 2011-02-05 LAB — CBC
MCV: 91.5 fL (ref 78.0–100.0)
Platelets: ADEQUATE 10*3/uL (ref 150–400)
RBC: 5.25 MIL/uL — ABNORMAL HIGH (ref 3.87–5.11)
WBC: 12.1 10*3/uL — ABNORMAL HIGH (ref 4.0–10.5)

## 2011-02-05 LAB — PROTIME-INR: Prothrombin Time: 12.6 seconds (ref 11.6–15.2)

## 2011-02-05 LAB — HCG, SERUM, QUALITATIVE: Preg, Serum: NEGATIVE

## 2011-02-24 ENCOUNTER — Other Ambulatory Visit: Payer: Self-pay | Admitting: *Deleted

## 2011-02-24 MED ORDER — ROSUVASTATIN CALCIUM 10 MG PO TABS: 10.0000 mg | ORAL_TABLET | Freq: Every day | ORAL | Status: DC

## 2011-04-03 ENCOUNTER — Other Ambulatory Visit: Payer: Self-pay | Admitting: *Deleted

## 2011-04-03 NOTE — Telephone Encounter (Signed)
Form from [facility] is on your shelf.  They are asking for a new script for crestor.

## 2011-04-04 MED ORDER — ROSUVASTATIN CALCIUM 10 MG PO TABS: 10.0000 mg | ORAL_TABLET | Freq: Every day | ORAL | Status: DC

## 2011-04-04 NOTE — Assessment & Plan Note (Signed)
[name]                                   ON-CALL NOTE   SUNDI, Carolyn Cordova                        MRN:          [id number]  DATE:07/17/2006                            DOB:          [birth year]    TIME OF CALL:  6:00 a.m.   Phone number is [phone].   PRIMARY CARE DOCTOR:  Dr. Milinda Antis.   SUBJECTIVE:  Carolyn Cordova is a 50 year old female who awoke at around 1:00  a.m. this morning with moderate to severe pain in her upper left side of her  abdomen.  She states that she is having some dry heaves but has not vomited.  She denies diarrhea, constipation, fever, chills, chest pain, shortness of  breath.  She did have a sick contact earlier in the week with her child who  had a stomach virus.  She has no past medical history for abdominal  pain.   ASSESSMENT:  Left upper quadrant abdominal  pain.   PLAN:  Given the fact that Ms. Callow's pain is fairly severe, she does not  think that she can tolerate it to see a physician in the next few hours.  Her symptoms may be secondary to abdominal cramping from viral  gastroenteritis versus gas and less likely obstruction.  She does state that  she is able to ambulate and move without an increase in the pain, suggesting  there is no rebound tenderness and most likely does not have an acute  abdomen.  I recommended for the patient to go to the emergency room, and if  she could not tolerate the pain, but if it does not continue to worsen, she  may call for an appointment within the next 2 hours at [facility] when it opens.                                   Kerby Nora, MD   AB/MedQ  DD:  07/17/2006  DT:  07/17/2006  Job #:  [id number]

## 2011-04-04 NOTE — Telephone Encounter (Signed)
Done, in IN box 

## 2011-04-04 NOTE — Discharge Summary (Signed)
Carolyn Cordova, RENSCH NO.:  [id number]   MEDICAL RECORD NO.:  [id number]          PATIENT TYPE:  INP   LOCATION:  4708                         FACILITY:  [facility]   PHYSICIAN:  Barbette Hair. Artist Pais, DO      DATE OF BIRTH:  1961-03-21   DATE OF ADMISSION:  07/17/2006  DATE OF DISCHARGE:  07/19/2006                                 DISCHARGE SUMMARY   DISCHARGE DIAGNOSES:  1. Left upper quadrant pain presumed secondary to splenic infarcts.  2. Splenic infarcts, unclear etiology.  3. History of anxiety.  4. Negative 2D echo for sign of subacute bacterial endocarditis or patent      foramen ovale.   DISCHARGE MEDICATIONS:  1. Zoloft 25 mg once daily.  2. Zyrtec 10 mg once daily.  3. Vicodin 5/500 1 q 4-6 hours as needed for pain.   HOSPITAL COURSE:  The patient is a 50 year old white female who is basically  healthy who presented with left upper quadrant pain, severe, followed by  nausea with vomiting.  The patient is unable to find a comfortable position  nd came to the ER for evaluation.  The patient has no history of a DVT or  blood clots.  No history of abdominal trauma.  The patient did note some  issues with fatigue and viral syndrome approximately one week ago.  The  patient has two daughters that have upper respiratory illnesses.   Patient underwent CAT scan of her abdomen and pelvis with contrast which  revealed splenic infarcts and also nonspecific benign appearing hepatic  cysts.  Due to the possibility of septic emboli, blood cultures were  obtained which were negative.  In addition, a 2D echocardiogram with  agitated saline was performed.  Although a tiny PFO could not be excluded,  there was no evidence of agitated saline in the left ventricle.  A hypercoag  workup was sent on admission and was pending at the time of this dictation.   Left upper quadrant pain:  The patient's symptoms improved during her  hospitalization.  The patient was told that she will  need a followup  regarding results of hypercoag workup.  In addition, a CAT scan can be  repeated in 3-6 months to follow progression of splenic infarcts.  If  patient has evidence that the entire spleen has auto infarcted, the patient  will likely need vaccines for capsulated organisms.   LABORATORY DATA:  CBC on September 1st showed WBC 9.4, H&H of 12.8 and 37.7,  platelet count of 281.  No abnormal cells noted on differential on August  31st.  Morphology of smear was unremarkable.  Patient had comprehensive  metabolic profile notable for mildly elevated glucose on August 31st at 7:26  a.m.  This may need to be repeated as an outpatient and hemoglobin A1c can  be considered.   She is to follow up with Dr. Milinda Antis, her primary care physician within one  week's time and return to the ER if left upper quadrant pain returns.      Barbette Hair. Artist Pais, DO  Electronically Signed  RDY/MEDQ  D:  07/19/2006  T:  07/19/2006  Job:  [id number]   cc:   Marne A. Milinda Antis, MD

## 2011-04-04 NOTE — Telephone Encounter (Signed)
Completed form faxed to [phone] as instructed.

## 2011-06-18 HISTORY — PX: OTHER SURGICAL HISTORY: SHX169

## 2011-08-04 ENCOUNTER — Encounter: Payer: Self-pay | Admitting: Family Medicine

## 2011-08-05 ENCOUNTER — Ambulatory Visit: Payer: BC Managed Care – PPO | Admitting: Family Medicine

## 2011-08-11 ENCOUNTER — Encounter: Payer: Self-pay | Admitting: Family Medicine

## 2011-08-12 ENCOUNTER — Encounter: Payer: Self-pay | Admitting: *Deleted

## 2011-08-26 ENCOUNTER — Encounter: Payer: Self-pay | Admitting: Family Medicine

## 2011-08-26 ENCOUNTER — Ambulatory Visit (INDEPENDENT_AMBULATORY_CARE_PROVIDER_SITE_OTHER): Payer: BC Managed Care – PPO | Admitting: Family Medicine

## 2011-08-26 DIAGNOSIS — Z23 Encounter for immunization: Secondary | ICD-10-CM

## 2011-08-26 DIAGNOSIS — I10 Essential (primary) hypertension: Secondary | ICD-10-CM

## 2011-08-26 DIAGNOSIS — F17200 Nicotine dependence, unspecified, uncomplicated: Secondary | ICD-10-CM

## 2011-08-26 DIAGNOSIS — B370 Candidal stomatitis: Secondary | ICD-10-CM | POA: Insufficient documentation

## 2011-08-26 DIAGNOSIS — B373 Candidiasis of vulva and vagina: Secondary | ICD-10-CM

## 2011-08-26 LAB — POCT WET PREP (WET MOUNT): KOH Wet Prep POC: POSITIVE

## 2011-08-26 MED ORDER — FLUCONAZOLE 150 MG PO TABS: ORAL_TABLET | ORAL | Status: DC

## 2011-08-26 MED ORDER — NYSTATIN 100000 UNIT/ML MT SUSP: 500000.0000 [IU] | Freq: Four times a day (QID) | OROMUCOSAL | Status: AC

## 2011-08-26 NOTE — Assessment & Plan Note (Signed)
Mild after steroids/abx and foot surgery Not resp entirely to magic mouthwash Will try nystatin swish and swallow for 10 d and update if not resolved

## 2011-08-26 NOTE — Assessment & Plan Note (Signed)
After course of steroids/ abx and foot surgery  Will try more aggressive approach of diflucan 150 mg - times 3 doses separated each by 3 days Update if not improved

## 2011-08-26 NOTE — Patient Instructions (Signed)
Take the nystatin mouthwash 1 teaspoon swish and swallow four times daily for 10 days  Take diflucan 1 pill today , and again in 3 days and then once more 3 days after that If symptoms do not resolve - please let me know  Flu shot today

## 2011-08-26 NOTE — Assessment & Plan Note (Signed)
Good bp control No change in med  Adv to quit smoking  F/u in winter to early spring

## 2011-08-26 NOTE — Assessment & Plan Note (Signed)
Disc in detail risks of smoking and possible outcomes including copd, vascular/ heart disease, cancer , respiratory and sinus infections  Pt voices understanding Pt not ready to quit yet 

## 2011-08-26 NOTE — Progress Notes (Signed)
Subjective:    Patient ID: Carolyn Cordova, female    DOB: 1960-12-31, 50 y.o.   MRN: [id number]  HPI Here for f/u of chronic health problems HTN and lipids and a yeast infection  Also to disc problems/ new meds Is doing well overall    Lipids on crestor - good in march   HTN in good control 122/80 No cp or palp or ha   Surgery on foot aug 10th - Dr Al Corpus  For plantar fasciitis- it really helped  Was on antibiotics for a while , and last steroid was before surgery - was like prednisone  Then got thrush and suspects vaginal yeast infection  Also still a smoker - wants to quit but does not think she can, no concrete plan  Given magic mouthwash for 20 days (tid)  (did not knock it out) Did swish and swallow  Then diflucan - has had 4 since august (spread out )-keeps coming and going  Some vaginal itching and some discharge   Patient Active Problem List  Diagnoses  . PURE HYPERCHOLESTEROLEMIA  . TOBACCO ABUSE  . CARPAL TUNNEL SYNDROME, BILATERAL  . HYPERTENSION  . HEMORRHOIDS  . DYSPLASIA, CERVIX NOS  . HERNIATED CERVICAL DISC  . MIGRAINES, HX OF  . Thrush  . Yeast vaginitis   Past Medical History  Diagnosis Date  . Hypertension   . PMDD (premenstrual dysphoric disorder)   . Migraine   . Carpal tunnel syndrome   . Cervical disc disease    Past Surgical History  Procedure Date  . Pilonidal cyst excision 1980s    x2  . Cryoablation 11/90    secondary to dysplasia  . Lipoma removal 2006  . Spine surgery 03/11    cervical spine surgery  . Plantar fasciitis 8/12    surgery with Dr Al Corpus   History  Substance Use Topics  . Smoking status: Current Everyday Smoker -- 1.0 packs/day    Types: Cigarettes  . Smokeless tobacco: Not on file  . Alcohol Use: No   Family History  Problem Relation Age of Onset  . Hyperlipidemia Mother   . Cancer Mother     breast CA  . Cancer Father     adrenal cancer  . Colon polyps Father   . Hyperlipidemia Father   .  Hyperlipidemia Brother   . Cancer Cousin     breast CA   Allergies  Allergen Reactions  . Codeine     REACTION: ? reaction  . Fexofenadine     REACTION: nausea  . WUJ:WJXBJYNWGNF+AOZHYQMVH+QIONGEXBMW Acid+Aspartame     REACTION: rash  . Sertraline Hcl     REACTION: sedation  . Varenicline Tartrate     REACTION: made her feel sick and did not work   Current Outpatient Prescriptions on File Prior to Visit  Medication Sig Dispense Refill  . amLODipine (NORVASC) 5 MG tablet Take 5 mg by mouth daily.        Marland Kitchen aspirin 81 MG tablet Take 81 mg by mouth daily.        . Cetirizine HCl (ZYRTEC ALLERGY) 10 MG CAPS Take 1 capsule by mouth daily.        . diclofenac (VOLTAREN) 75 MG EC tablet Take 75 mg by mouth daily.        . rosuvastatin (CRESTOR) 10 MG tablet Take 1 tablet (10 mg total) by mouth at bedtime.  90 tablet  3  . fluticasone (FLONASE) 50 MCG/ACT nasal spray Place into the nose as  directed.            Review of Systems Review of Systems  Constitutional: Negative for fever, appetite change, fatigue and unexpected weight change.  Eyes: Negative for pain and visual disturbance.  ENT pos for bad taste in mouth/ white coating and gravely throat feeling  Respiratory: Negative for cough and shortness of breath.   Cardiovascular: Negative for cp or palpitations    Gastrointestinal: Negative for nausea, diarrhea and constipation.  Genitourinary: Negative for urgency and frequency. pos for vaginal itch and white d/c Skin: Negative for pallor or rash   Neurological: Negative for weakness, light-headedness, numbness and headaches.  Hematological: Negative for adenopathy. Does not bruise/bleed easily.  Psychiatric/Behavioral: Negative for dysphoric mood. The patient is not nervous/anxious.          Objective:   Physical Exam  Constitutional: She appears well-developed and well-nourished. No distress.       overwt and well appearing   HENT:  Head: Normocephalic and atraumatic.    Right Ear: External ear normal.  Left Ear: External ear normal.  Nose: Nose normal.       Scant non scrapable white coating on tongue and a little on post palate No throat redness or swelling   Eyes: Conjunctivae and EOM are normal. Pupils are equal, round, and reactive to light. Right eye exhibits no discharge. Left eye exhibits no discharge. No scleral icterus.  Neck: Normal range of motion. Neck supple. No JVD present. Carotid bruit is not present. No thyromegaly present.  Cardiovascular: Normal rate, regular rhythm and normal heart sounds.   Pulmonary/Chest: Effort normal and breath sounds normal. No respiratory distress. She has no wheezes.       Diffusely distant bs   Abdominal: Soft. Bowel sounds are normal. There is no tenderness.       No suprapubic tenderness  Genitourinary: Vaginal discharge found.       Mild labial hyperemia and scant white d/c noted  Lymphadenopathy:    She has no cervical adenopathy.  Neurological: She is alert. She has normal reflexes.  Skin: Skin is warm and dry. No rash noted. No erythema. No pallor.  Psychiatric: She has a normal mood and affect.          Assessment & Plan:

## 2011-09-19 ENCOUNTER — Telehealth: Payer: Self-pay | Admitting: *Deleted

## 2011-09-19 DIAGNOSIS — B373 Candidiasis of vulva and vagina: Secondary | ICD-10-CM

## 2011-09-19 MED ORDER — NYSTATIN 100000 UNIT/ML MT SUSP: 500000.0000 [IU] | Freq: Three times a day (TID) | OROMUCOSAL | Status: AC

## 2011-09-19 NOTE — Telephone Encounter (Signed)
Message left notifying patient that you have suggested GYN referral. Advised to call back if agreeable. Also advised that Rx had been sent in for the thrush. Instructed to call back in 2 weeks with an update on that. Will await return call about referral.

## 2011-09-19 NOTE — Telephone Encounter (Signed)
Last time we treated her fairly aggressively for her yeast infection - and that sounds like a recurrent problem-- I would advise a gyn visit to see why this keeps coming back and make sure it can be eradicated -- let me know if agreeable For the thrush - let me know if it did get better totally and when it came back  Will do the nystatin mouthwash- this time for 2 weeks and update me -- Px written for call in

## 2011-09-19 NOTE — Telephone Encounter (Signed)
Pt says you recently treated her for thrush, and yeast infection, she says you told her to call if no imp and she states they have returned. Please advise

## 2011-09-22 NOTE — Telephone Encounter (Signed)
Pt called back, she is agreeable to gyn referral, prefers to go to [place].  She is asking that she be called back on her cell phone first- [phone].

## 2011-10-01 ENCOUNTER — Ambulatory Visit (INDEPENDENT_AMBULATORY_CARE_PROVIDER_SITE_OTHER): Payer: BC Managed Care – PPO | Admitting: Obstetrics & Gynecology

## 2011-10-01 ENCOUNTER — Encounter: Payer: Self-pay | Admitting: Obstetrics & Gynecology

## 2011-10-01 VITALS — BP 126/84 | HR 88 | Ht 69.0 in | Wt 186.0 lb

## 2011-10-01 DIAGNOSIS — N952 Postmenopausal atrophic vaginitis: Secondary | ICD-10-CM | POA: Insufficient documentation

## 2011-10-01 MED ORDER — ESTRADIOL 10 MCG VA TABS: ORAL_TABLET | VAGINAL | Status: DC

## 2011-10-01 NOTE — Progress Notes (Signed)
Addended by: Vinnie Langton C on: 10/01/2011 02:46 PM   Modules accepted: Orders

## 2011-10-01 NOTE — Progress Notes (Signed)
  Subjective:    Patient ID: Carolyn Cordova, female    DOB: September 19, 1961, 50 y.o.   MRN: [id number]  HPI  [name] has been referred here by Dr. Milinda Cordova for evalualtion of "itching and burning" of her vagina for several months.  [name] had foot surgery several months ago and had antibiotics then.  She attributes her vaginal symptoms to "yeast". She has been menopausal for about a year and says that her hot flashes are present but she is not interested in treatment now.  She and her husband (350 pounds) have not been sexually active for more than a year.  Review of Systems Pap/ mammogram normal in 2/12 She is interested in BRCA testing due to her Family history.    Objective:   Physical Exam  Moderate vaginal atrophy Normal discharge noted Normal bimanual exam- NSSA, NT, no adnexal masses      Assessment & Plan:  1. Atrophic vaginitis- samples of Vagifem and prescription sent. I did send a wet prep 2. Strong FH of CA- BRCA testing.

## 2011-10-02 LAB — WET PREP, GENITAL: Clue Cells Wet Prep HPF POC: NONE SEEN

## 2011-10-04 ENCOUNTER — Encounter: Payer: Self-pay | Admitting: Family Medicine

## 2011-10-04 ENCOUNTER — Ambulatory Visit (INDEPENDENT_AMBULATORY_CARE_PROVIDER_SITE_OTHER): Payer: BC Managed Care – PPO | Admitting: Family Medicine

## 2011-10-04 VITALS — BP 130/80 | HR 84 | Temp 98.7°F | Wt 188.0 lb

## 2011-10-04 DIAGNOSIS — S39012A Strain of muscle, fascia and tendon of lower back, initial encounter: Secondary | ICD-10-CM | POA: Insufficient documentation

## 2011-10-04 DIAGNOSIS — S335XXA Sprain of ligaments of lumbar spine, initial encounter: Secondary | ICD-10-CM

## 2011-10-04 MED ORDER — NAPROXEN 500 MG PO TABS: ORAL_TABLET | ORAL | Status: DC

## 2011-10-04 MED ORDER — CYCLOBENZAPRINE HCL 5 MG PO TABS: 5.0000 mg | ORAL_TABLET | Freq: Two times a day (BID) | ORAL | Status: DC | PRN

## 2011-10-04 NOTE — Patient Instructions (Addendum)
You have lumbar strain. Treat with anti inflammatories and flexeril for muscle spasm. Update [place] if not improving as expected or any worsening.

## 2011-10-04 NOTE — Assessment & Plan Note (Signed)
With lumbar spasm L>R. Treat conservatively with NSAIDs, ice/heat, muscle relaxant. Update [place] if not improving as expected.

## 2011-10-04 NOTE — Progress Notes (Signed)
  Subjective:    Patient ID: Carolyn Cordova, female    DOB: January 01, 1961, 50 y.o.   MRN: [id number]  HPI CC: lower back spasms  1d h/o lower back spasms described as sharp pains that start midline and go along band lower back, started after lifting heavy box, initially didn't bother but progressively worsening.    So far has tried ibuprofen, which didn't help.  No fevers/chills, numbness/weakness in legs, radiculopathy down legs, bowel/bladder accidents.  H/o cervical disc disease.  Also with recent left foot surgery for plantar fasciitis, so walks favoring that side.  Review of Systems Per HPI    Objective:   Physical Exam  Nursing note and vitals reviewed. Constitutional: She is oriented to person, place, and time. She appears well-developed and well-nourished. No distress.  HENT:  Head: Normocephalic and atraumatic.  Musculoskeletal:       Mild midline spine tenderness. + paraspinous tenderness and tightness L>R Neg SLR bilaterally. No pain with int/ext rotation at hips Some tender bilateral SIJ. No GTB pain or pain at sciatic notch.  Neurological: She is alert and oriented to person, place, and time. She has normal strength. No cranial nerve deficit or sensory deficit.  Reflex Scores:      Patellar reflexes are 2+ on the right side and 2+ on the left side.      Achilles reflexes are 1+ on the right side and 1+ on the left side.      Gait intact.  Skin: Skin is warm and dry. No rash noted.  Psychiatric: She has a normal mood and affect.      Assessment & Plan:

## 2011-10-29 ENCOUNTER — Telehealth: Payer: Self-pay | Admitting: Internal Medicine

## 2011-10-29 NOTE — Telephone Encounter (Signed)
Did the nystatin mouthwash work before - if so , can call that in- let me know

## 2011-10-29 NOTE — Telephone Encounter (Signed)
Patient notified as instructed by telephone. Pt said the Nystatin mouthwash did not clear up the thrush . Pt said it has never completely gone away. Pt understands it may be tomorrow when receive call back due to time of day and she said that was OK.

## 2011-10-29 NOTE — Telephone Encounter (Signed)
Patient was seen a month ago here for thrush and you referred her to [facility] for recurring yeast infection.  Now she is having thrush in her mouth and would like to know if she needs to come back and see you or refer her to someone else.  Please advise.

## 2011-10-30 MED ORDER — FLUCONAZOLE 100 MG PO TABS: ORAL_TABLET | ORAL | Status: DC

## 2011-10-30 NOTE — Telephone Encounter (Signed)
Lets try a course of oral fluconazole - and let me know if she is already on it  Call if this does not wipe it out

## 2011-10-30 NOTE — Telephone Encounter (Signed)
Lets try oral fluconazole  Px written for call in   Update if not improved

## 2011-10-30 NOTE — Telephone Encounter (Signed)
Addended by: Roxy Manns A on: 10/30/2011 08:14 AM   Modules accepted: Orders

## 2011-10-30 NOTE — Telephone Encounter (Signed)
Patient notified as instructed by telephone.Medication phoned to [facility] as instructed.  

## 2011-11-07 ENCOUNTER — Telehealth: Payer: Self-pay | Admitting: Family Medicine

## 2011-11-07 DIAGNOSIS — B370 Candidal stomatitis: Secondary | ICD-10-CM

## 2011-11-07 NOTE — Telephone Encounter (Signed)
I am not sure why this keeps coming back after treatment -- rev last labs and even sugar was good I would like to set her up with an ENT to take a look- let me know if agreeable

## 2011-11-07 NOTE — Telephone Encounter (Signed)
Pt states thrush is better but back of tongue is still white and "chunky stiff" is still coming off tongue. Pt finished Diflucan Wed.11/05/11. Pt can be reached at [phone] and uses [facility] if pharmacy needed.

## 2011-11-07 NOTE — Telephone Encounter (Signed)
Dr. Milinda Antis had started her on a medicine for thrush and said if not better to call back.  She said symptoms are not better.  Please call patient at [phone].

## 2011-11-07 NOTE — Telephone Encounter (Signed)
Ref done  

## 2011-11-07 NOTE — Telephone Encounter (Signed)
Patient notified as instructed by telephone. Pt is agreeable for ENT referral. Pt has seen Dr Jenne Pane at [facility] before and would like appt before end of year due to insurance deductible. Pt will wait to hear from pt care coordiniator.

## 2011-11-28 ENCOUNTER — Telehealth: Payer: Self-pay | Admitting: Family Medicine

## 2011-11-28 NOTE — Telephone Encounter (Signed)
Spoke to Dr Lazarus Salines today about pt  She is being treated for a stubborn fungal sinusitis Still bothered by dry mouth (and dry eyes)  sjorgen ab and sed rate nl but RF is slt elevated at 27 Doubt significant but wanted to let me know  Trying evozac for dryness and it helps  He will inst her to f/u with me for further eval and discussion I thanked him for her care

## 2012-02-11 ENCOUNTER — Other Ambulatory Visit: Payer: Self-pay

## 2012-02-11 MED ORDER — AMLODIPINE BESYLATE 5 MG PO TABS: 5.0000 mg | ORAL_TABLET | Freq: Every day | ORAL | Status: DC

## 2012-02-11 NOTE — Telephone Encounter (Signed)
[facility] faxed request amlodipine 5 mg #90 x 3. Pt last seen 10/04/11.

## 2012-02-26 ENCOUNTER — Encounter: Payer: Self-pay | Admitting: Family Medicine

## 2012-02-26 ENCOUNTER — Ambulatory Visit (INDEPENDENT_AMBULATORY_CARE_PROVIDER_SITE_OTHER): Payer: BC Managed Care – PPO | Admitting: Family Medicine

## 2012-02-26 VITALS — BP 120/80 | HR 94 | Temp 98.4°F | Ht 69.0 in | Wt 186.0 lb

## 2012-02-26 DIAGNOSIS — H6690 Otitis media, unspecified, unspecified ear: Secondary | ICD-10-CM

## 2012-02-26 DIAGNOSIS — H6692 Otitis media, unspecified, left ear: Secondary | ICD-10-CM

## 2012-02-26 MED ORDER — FLUCONAZOLE 150 MG PO TABS: ORAL_TABLET | ORAL | Status: DC

## 2012-02-26 MED ORDER — AZITHROMYCIN 250 MG PO TABS: ORAL_TABLET | ORAL | Status: AC

## 2012-02-26 NOTE — Progress Notes (Signed)
SUBJECTIVE: Carolyn Cordova is a 51 y.o. female brought by self with 7 day(s) history of pain and pulling at left ear, and congestion, sneezing and nasal blockage. Temperature not elevated at home.   OBJECTIVE: BP 120/80  Pulse 94  Temp(Src) 98.4 F (36.9 C) (Oral)  Ht 5\' 9"  (1.753 m)  Wt 186 lb (84.369 kg)  BMI 27.47 kg/m2  SpO2 98% General appearance: alert, well appearing, and in no distress.   Ears: right ear normal, left TM red, dull, bulging Nose: normal and patent, no erythema, discharge or polyps Oropharynx: mucous membranes moist, pharynx normal without lesions Neck: supple, no significant adenopathy Lungs: clear to auscultation, no wheezes, rales or rhonchi, symmetric air entry  ASSESSMENT: Otitis Media  PLAN: 1) See orders for this visit as documented in the electronic medical record. 2) Symptomatic therapy suggested: use acetaminophen prn.  3) Call or return to clinic prn if these symptoms worsen or fail to improve as anticipated. Zpak - if not bette, change abx next week

## 2012-03-02 ENCOUNTER — Encounter: Payer: Self-pay | Admitting: Internal Medicine

## 2012-03-03 ENCOUNTER — Telehealth: Payer: Self-pay | Admitting: Family Medicine

## 2012-03-03 NOTE — Telephone Encounter (Signed)
Caller: Carolyn Cordova/Patient; PCP: Roxy Manns A.; CB#: [phone]; Call regarding Ear Drng; [place] ear drng R ear this AM. Occasional sharp ear pain, decreased hearing. Appt sched for 03/04/12 @ 1000 with Dr. Patsy Lager. Ear: Sx Protocol.

## 2012-03-04 ENCOUNTER — Other Ambulatory Visit: Payer: Self-pay | Admitting: *Deleted

## 2012-03-04 ENCOUNTER — Ambulatory Visit (INDEPENDENT_AMBULATORY_CARE_PROVIDER_SITE_OTHER): Payer: BC Managed Care – PPO | Admitting: Family Medicine

## 2012-03-04 ENCOUNTER — Encounter: Payer: Self-pay | Admitting: Family Medicine

## 2012-03-04 VITALS — BP 130/80 | HR 98 | Temp 98.4°F | Ht 69.0 in | Wt 183.0 lb

## 2012-03-04 DIAGNOSIS — H6690 Otitis media, unspecified, unspecified ear: Secondary | ICD-10-CM

## 2012-03-04 DIAGNOSIS — J301 Allergic rhinitis due to pollen: Secondary | ICD-10-CM

## 2012-03-04 MED ORDER — FLUCONAZOLE 150 MG PO TABS: ORAL_TABLET | ORAL | Status: DC

## 2012-03-04 MED ORDER — CEFDINIR 300 MG PO CAPS: 600.0000 mg | ORAL_CAPSULE | Freq: Every day | ORAL | Status: AC

## 2012-03-04 MED ORDER — FLUTICASONE PROPIONATE 50 MCG/ACT NA SUSP: 2.0000 | Freq: Every day | NASAL | Status: AC

## 2012-03-04 MED ORDER — OFLOXACIN 0.3 % OT SOLN: OTIC | Status: AC

## 2012-03-04 NOTE — Progress Notes (Signed)
  Patient Name: Carolyn Cordova Date of Birth: 20-May-1961 Age: 51 y.o. Medical Record Number: [id number] Gender: female Date of Encounter: 03/04/2012  History of Present Illness:  Carolyn Cordova is a 51 y.o. very pleasant female patient who presents with the following:  Sharp pain, more on the R compared to the left. Feels -- drainage on R ear.  Decreased hearing Some pain, more on the R ear ? Possible fever over the weekend  Not better - prob worse, completed Zpak, PCN allergic   Past Medical History, Surgical History, Social History, Family History, Problem List, Medications, and Allergies have been reviewed and updated if relevant.  Review of Systems: ROS: GEN: Acute illness details above GI: Tolerating PO intake GU: maintaining adequate hydration and urination Pulm: No SOB Interactive and getting along well at home.  Otherwise, ROS is as per the HPI.   Physical Examination: Filed Vitals:   03/04/12 1000  BP: 130/80  Pulse: 98  Temp: 98.4 F (36.9 C)  TempSrc: Oral  Height: 5\' 9"  (1.753 m)  Weight: 183 lb (83.008 kg)  SpO2: 98%    Body mass index is 27.02 kg/(m^2).   GEN: WDWN, NAD, Non-toxic, Alert & Oriented x 3 HEENT: Atraumatic, Normocephalic. R and L TM bulging, red, landmarks poorly seen. R ear canal with wetness, no perforation seen Ears and Nose: No external deformity. EXTR: No clubbing/cyanosis/edema NEURO: Normal gait.  PSYCH: Normally interactive. Conversant. Not depressed or anxious appearing.  Calm demeanor.    Assessment and Plan:  1. Otitis media treated with antibiotics in the past 60 days   2. Allergic rhinitis due to pollen    OM, failure zpak, PCN allergy. ? Probable small R TM rupture  Place on floxin otic Omnicef - discussed potential allergy with crossover, but in this case, seems most reasonable to challenge and treat with cephalosporin for coverage given treatment failure and worsening. If rash, will have to change.  F/u ENT  if not improved Monday  Orders Today: No orders of the defined types were placed in this encounter.    Medications Today: Meds ordered this encounter  Medications  . cefdinir (OMNICEF) 300 MG capsule    Sig: Take 2 capsules (600 mg total) by mouth daily.    Dispense:  20 capsule    Refill:  0  . fluticasone (FLONASE) 50 MCG/ACT nasal spray    Sig: Place 2 sprays into the nose daily.    Dispense:  16 g    Refill:  12  . ofloxacin (FLOXIN) 0.3 % otic solution    Sig: 10 drops in each ear x 7 days    Dispense:  10 mL    Refill:  0

## 2012-03-08 ENCOUNTER — Telehealth: Payer: Self-pay

## 2012-03-08 DIAGNOSIS — H6690 Otitis media, unspecified, unspecified ear: Secondary | ICD-10-CM

## 2012-03-08 NOTE — Telephone Encounter (Signed)
Pt saw Dr Patsy Lager 03/04/12 with ear infection. Pt states no better today and request ENT referral Dr Patsy Lager discussed with pt on 03/04/12. Pt can be reached [phone].

## 2012-03-08 NOTE — Telephone Encounter (Signed)
done

## 2012-04-07 ENCOUNTER — Other Ambulatory Visit (HOSPITAL_COMMUNITY): Payer: Self-pay | Admitting: Rheumatology

## 2012-04-07 DIAGNOSIS — R609 Edema, unspecified: Secondary | ICD-10-CM

## 2012-04-07 DIAGNOSIS — R52 Pain, unspecified: Secondary | ICD-10-CM

## 2012-04-09 ENCOUNTER — Other Ambulatory Visit (HOSPITAL_COMMUNITY): Payer: BC Managed Care – PPO

## 2012-04-20 ENCOUNTER — Encounter: Payer: Self-pay | Admitting: Family Medicine

## 2012-04-20 ENCOUNTER — Ambulatory Visit (INDEPENDENT_AMBULATORY_CARE_PROVIDER_SITE_OTHER): Payer: BC Managed Care – PPO | Admitting: Family Medicine

## 2012-04-20 VITALS — BP 118/78 | HR 87 | Temp 98.1°F | Ht 69.0 in | Wt 185.0 lb

## 2012-04-20 DIAGNOSIS — R894 Abnormal immunological findings in specimens from other organs, systems and tissues: Secondary | ICD-10-CM

## 2012-04-20 DIAGNOSIS — R682 Dry mouth, unspecified: Secondary | ICD-10-CM | POA: Insufficient documentation

## 2012-04-20 DIAGNOSIS — E7800 Pure hypercholesterolemia, unspecified: Secondary | ICD-10-CM

## 2012-04-20 DIAGNOSIS — F17200 Nicotine dependence, unspecified, uncomplicated: Secondary | ICD-10-CM

## 2012-04-20 DIAGNOSIS — K117 Disturbances of salivary secretion: Secondary | ICD-10-CM

## 2012-04-20 DIAGNOSIS — I10 Essential (primary) hypertension: Secondary | ICD-10-CM

## 2012-04-20 DIAGNOSIS — R768 Other specified abnormal immunological findings in serum: Secondary | ICD-10-CM

## 2012-04-20 NOTE — Assessment & Plan Note (Signed)
Disc in detail risks of smoking and possible outcomes including copd, vascular/ heart disease, cancer , respiratory and sinus infections  Pt voices understanding  She is trying to cut back on her own but not ready to quit

## 2012-04-20 NOTE — Progress Notes (Signed)
Subjective:    Patient ID: Carolyn Cordova, female    DOB: 1961-05-27, 51 y.o.   MRN: [id number]  HPI Here for f/u of chronic conditions  Sees Dr Lazarus Salines- recent ear problems and infection  Still having sinus congestion  Finally got control with omnicef  Is trying to clear her own eustacian tubes   bp is  118/78   Today BP Readings from Last 3 Encounters:  04/20/12 118/78  03/04/12 130/80  02/26/12 120/80    No cp or palpitations or headaches or edema  No side effects to medicines  -on norvasc     Hyperlipidemia - crestor and diet  Is eating better - eating a lot of salads / better choices with fast food  Loves red meat but tries to limit it   ENT doctor did autoimmune w/u for dry mouth and eyes On evoxac    No fam hx of rheum dz Her RF slt elevated at 27 ESR and ANA and other tests neg incl sjorgens antibody  Fingers feel stiff No deformity  No swollen joints  No rash   Still smokes but is gradually cutting back and not ready to quit yet No cough or sob  Aware of the risks  Does want to quit eventually Smokes for stress  Patient Active Problem List  Diagnoses  . PURE HYPERCHOLESTEROLEMIA  . TOBACCO ABUSE  . CARPAL TUNNEL SYNDROME, BILATERAL  . HYPERTENSION  . HEMORRHOIDS  . DYSPLASIA, CERVIX NOS  . HERNIATED CERVICAL DISC  . MIGRAINES, HX OF  . Thrush  . Yeast vaginitis  . Atrophic vaginitis  . Lumbar strain  . Dry mouth  . Elevated rheumatoid factor   Past Medical History  Diagnosis Date  . Hypertension   . PMDD (premenstrual dysphoric disorder)   . Migraine   . Carpal tunnel syndrome   . Cervical disc disease    Past Surgical History  Procedure Date  . Pilonidal cyst excision 1980s    x2  . Cryoablation 11/90    secondary to dysplasia  . Lipoma removal 2006  . Spine surgery 03/11    cervical spine surgery  . Plantar fasciitis 8/12    surgery with Dr Al Corpus   History  Substance Use Topics  . Smoking status: Current Everyday Smoker  -- 1.0 packs/day    Types: Cigarettes  . Smokeless tobacco: Never Used  . Alcohol Use: No   Family History  Problem Relation Age of Onset  . Hyperlipidemia Mother   . Cancer Mother     breast CA  . Cancer Father     adrenal cancer  . Colon polyps Father   . Hyperlipidemia Father   . Hyperlipidemia Brother   . Cancer Cousin     breast CA   Allergies  Allergen Reactions  . Amoxicillin-Pot Clavulanate     REACTION: rash  . Codeine     REACTION: ? reaction  . Fexofenadine     REACTION: nausea  . Sertraline Hcl     REACTION: sedation  . Varenicline Tartrate     REACTION: made her feel sick and did not work   Current Outpatient Prescriptions on File Prior to Visit  Medication Sig Dispense Refill  . amLODipine (NORVASC) 5 MG tablet Take 1 tablet (5 mg total) by mouth daily.  90 tablet  3  . aspirin 81 MG tablet Take 81 mg by mouth daily.        . Estradiol (VAGIFEM) 10 MCG TABS Place 1 tablet per  vagina 2 times per week  30 tablet  12  . fluticasone (FLONASE) 50 MCG/ACT nasal spray Place 2 sprays into the nose daily.  16 g  12  . rosuvastatin (CRESTOR) 10 MG tablet Take 1 tablet (10 mg total) by mouth at bedtime.  90 tablet  3         Review of Systems Review of Systems  Constitutional: Negative for fever, appetite change, fatigue and unexpected weight change.  Eyes: Negative for pain and visual disturbance. ENt pos for chronic nasal congestion without purulent drainage or sinus pain/ or ear pain   Respiratory: Negative for cough and shortness of breath.   Cardiovascular: Negative for cp or palpitations    Gastrointestinal: Negative for nausea, diarrhea and constipation.  Genitourinary: Negative for urgency and frequency.  Skin: Negative for pallor or rash   MSK pos for finger stiffness without joint swelling or deformity Neurological: Negative for weakness, light-headedness, numbness and headaches.  Hematological: Negative for adenopathy. Does not bruise/bleed  easily.  Psychiatric/Behavioral: Negative for dysphoric mood. The patient is not nervous/anxious.          Objective:   Physical Exam  Constitutional: She appears well-developed and well-nourished. No distress.       overwt and well appearing   HENT:  Head: Normocephalic and atraumatic.  Right Ear: External ear normal.  Left Ear: External ear normal.  Mouth/Throat: Oropharynx is clear and moist. No oropharyngeal exudate.       Nares are boggy   Eyes: Conjunctivae and EOM are normal. Pupils are equal, round, and reactive to light. Right eye exhibits no discharge. Left eye exhibits no discharge.  Neck: Normal range of motion. Neck supple. No JVD present. Carotid bruit is not present. Erythema present. No thyromegaly present.  Cardiovascular: Normal rate, regular rhythm, normal heart sounds and intact distal pulses.  Exam reveals no gallop.   Pulmonary/Chest: Effort normal and breath sounds normal. No respiratory distress. She has no wheezes. She has no rales.       Diffusely distant bs   Abdominal: Soft. Bowel sounds are normal. She exhibits no distension, no abdominal bruit and no mass. There is no tenderness.  Musculoskeletal: She exhibits no edema and no tenderness.       No joint deformity or swelling or tenderness   Lymphadenopathy:    She has no cervical adenopathy.  Neurological: She is alert. She has normal reflexes. No cranial nerve deficit. She exhibits normal muscle tone. Coordination normal.  Skin: Skin is warm and dry. No rash noted. No erythema. No pallor.       telangiectasias on chest diffusely  Psychiatric: She has a normal mood and affect.          Assessment & Plan:

## 2012-04-20 NOTE — Assessment & Plan Note (Signed)
Had w/u for Sjogren syndrome at ENT Mild elevated RF No other symptoms besides dry mouth and eyes  Will re check RF in July (6 mo f/u) and continue to watch

## 2012-04-20 NOTE — Patient Instructions (Signed)
Schedule fasting lab in July  Avoid red meat/ fried foods/ egg yolks/ fatty breakfast meats/ butter, cheese and high fat dairy/ and shellfish   Work on quitting smoking  bp is ok  We will repeat rheumatoid factor If any joint pain or other new symptoms let me know

## 2012-04-20 NOTE — Assessment & Plan Note (Signed)
crestor and diet  Rev low sat fat diet  Lab planned for july

## 2012-04-20 NOTE — Assessment & Plan Note (Signed)
bp in fair control at this time  No changes needed  Disc lifstyle change with low sodium diet and exercise   Lab planned for july

## 2012-04-20 NOTE — Assessment & Plan Note (Addendum)
Mild at 27 - with dry eyes/ mouth tx by ENT and otherwise neg rheum w/u incl ANA/ ESR and Sjogren ab  Stiff fingers - no other symptoms  Will re check this in July (6 mo f/u) and continue to follow

## 2012-05-21 ENCOUNTER — Other Ambulatory Visit (INDEPENDENT_AMBULATORY_CARE_PROVIDER_SITE_OTHER): Payer: BC Managed Care – PPO

## 2012-05-21 DIAGNOSIS — E7800 Pure hypercholesterolemia, unspecified: Secondary | ICD-10-CM

## 2012-05-21 DIAGNOSIS — I10 Essential (primary) hypertension: Secondary | ICD-10-CM

## 2012-05-21 DIAGNOSIS — R768 Other specified abnormal immunological findings in serum: Secondary | ICD-10-CM

## 2012-05-21 LAB — CBC WITH DIFFERENTIAL/PLATELET
Basophils Absolute: 0.1 10*3/uL (ref 0.0–0.1)
Basophils Relative: 1 % (ref 0.0–3.0)
Hemoglobin: 15.1 g/dL — ABNORMAL HIGH (ref 12.0–15.0)
Lymphocytes Relative: 36.7 % (ref 12.0–46.0)
Monocytes Relative: 7.1 % (ref 3.0–12.0)
Neutro Abs: 3.2 10*3/uL (ref 1.4–7.7)
RBC: 4.99 Mil/uL (ref 3.87–5.11)

## 2012-05-21 LAB — COMPREHENSIVE METABOLIC PANEL
ALT: 22 U/L (ref 0–35)
Albumin: 4.2 g/dL (ref 3.5–5.2)
BUN: 13 mg/dL (ref 6–23)
CO2: 24 mEq/L (ref 19–32)
Calcium: 9.1 mg/dL (ref 8.4–10.5)
Chloride: 107 mEq/L (ref 96–112)
Creatinine, Ser: 0.6 mg/dL (ref 0.4–1.2)
GFR: 116.25 mL/min (ref >60.00)
Potassium: 3.9 mEq/L (ref 3.5–5.1)

## 2012-05-21 LAB — LIPID PANEL: HDL: 41.7 mg/dL (ref >39.00)

## 2012-05-21 LAB — TSH: TSH: 1.09 u[IU]/mL (ref 0.35–5.50)

## 2012-05-22 LAB — RHEUMATOID FACTOR: Rhuematoid fact SerPl-aCnc: <10 IU/mL (ref <=14)

## 2012-08-09 ENCOUNTER — Ambulatory Visit (INDEPENDENT_AMBULATORY_CARE_PROVIDER_SITE_OTHER): Payer: BC Managed Care – PPO | Admitting: Family Medicine

## 2012-08-09 ENCOUNTER — Encounter: Payer: Self-pay | Admitting: Family Medicine

## 2012-08-09 VITALS — BP 146/98 | HR 84 | Temp 98.2°F | Ht 69.0 in | Wt 182.0 lb

## 2012-08-09 DIAGNOSIS — Z8619 Personal history of other infectious and parasitic diseases: Secondary | ICD-10-CM | POA: Insufficient documentation

## 2012-08-09 DIAGNOSIS — B029 Zoster without complications: Secondary | ICD-10-CM

## 2012-08-09 MED ORDER — VALACYCLOVIR HCL 1 G PO TABS: 1000.0000 mg | ORAL_TABLET | Freq: Three times a day (TID) | ORAL | Status: DC

## 2012-08-09 MED ORDER — HYDROCODONE-ACETAMINOPHEN 5-500 MG PO TABS: 1.0000 | ORAL_TABLET | Freq: Four times a day (QID) | ORAL | Status: DC | PRN

## 2012-08-09 NOTE — Assessment & Plan Note (Signed)
Vesicular rash over L eye with some swelling and pain  Suspect shingles Handout given  Will start 7 d of valtrex today to help symptoms and also prevent post herpetic neuralgia  vicodin as needed for pain  Ref to opthy asap - since this is close to the eye - thankfully no vision or eye symptoms currently

## 2012-08-09 NOTE — Progress Notes (Signed)
Subjective:    Patient ID: Carolyn Cordova, female    DOB: 09/23/1961, 51 y.o.   MRN: [id number]  HPI Here with a rash over L eye  Started this weekend It hurts !-not itchy Around eye but no pain IN eye and no vision change Put ice on it  Looks like blisters   No rash anywhere else No new products or lotions/ makeup No trauma    Feels ok  Has been stressed  No fever  Patient Active Problem List  Diagnosis  . PURE HYPERCHOLESTEROLEMIA  . TOBACCO ABUSE  . CARPAL TUNNEL SYNDROME, BILATERAL  . HYPERTENSION  . HEMORRHOIDS  . DYSPLASIA, CERVIX NOS  . HERNIATED CERVICAL DISC  . MIGRAINES, HX OF  . Thrush  . Yeast vaginitis  . Atrophic vaginitis  . Lumbar strain  . Dry mouth  . Elevated rheumatoid factor   Past Medical History  Diagnosis Date  . Hypertension   . PMDD (premenstrual dysphoric disorder)   . Migraine   . Carpal tunnel syndrome   . Cervical disc disease    Past Surgical History  Procedure Date  . Pilonidal cyst excision 1980s    x2  . Cryoablation 11/90    secondary to dysplasia  . Lipoma removal 2006  . Spine surgery 03/11    cervical spine surgery  . Plantar fasciitis 8/12    surgery with Dr Al Corpus   History  Substance Use Topics  . Smoking status: Current Every Day Smoker -- 1.0 packs/day    Types: Cigarettes  . Smokeless tobacco: Never Used  . Alcohol Use: No   Family History  Problem Relation Age of Onset  . Hyperlipidemia Mother   . Cancer Mother     breast CA  . Cancer Father     adrenal cancer  . Colon polyps Father   . Hyperlipidemia Father   . Hyperlipidemia Brother   . Cancer Cousin     breast CA   Allergies  Allergen Reactions  . Amoxicillin-Pot Clavulanate     REACTION: rash  . Codeine     REACTION: ? reaction  . Fexofenadine     REACTION: nausea  . Sertraline Hcl     REACTION: sedation  . Varenicline Tartrate     REACTION: made her feel sick and did not work   Current Outpatient Prescriptions on File Prior  to Visit  Medication Sig Dispense Refill  . amLODipine (NORVASC) 5 MG tablet Take 1 tablet (5 mg total) by mouth daily.  90 tablet  3  . aspirin 81 MG tablet Take 81 mg by mouth daily.        . cevimeline (EVOXAC) 30 MG capsule Take 30 mg by mouth 3 (three) times daily.      . Estradiol (VAGIFEM) 10 MCG TABS Place 1 tablet per vagina 2 times per week  30 tablet  12  . rosuvastatin (CRESTOR) 10 MG tablet Take 1 tablet (10 mg total) by mouth at bedtime.  90 tablet  3  . fluticasone (FLONASE) 50 MCG/ACT nasal spray Place 2 sprays into the nose daily.  16 g  12         Review of Systems Review of Systems  Constitutional: Negative for fever, appetite change,  and unexpected weight change. pos for fatigue Eyes: Negative for pain and visual disturbance.  Respiratory: Negative for cough and shortness of breath.   Cardiovascular: Negative for cp or palpitations    Gastrointestinal: Negative for nausea, diarrhea and constipation.  Genitourinary:  Negative for urgency and frequency.  Skin: Negative for pallor and pos for rash with pain on face Neurological: Negative for weakness, light-headedness, numbness and headaches.  Hematological: Negative for adenopathy. Does not bruise/bleed easily.  Psychiatric/Behavioral: Negative for dysphoric mood. The patient is not nervous/anxious.  pos for increased stressors lately       Objective:   Physical Exam  Constitutional: She is oriented to person, place, and time. She appears well-developed and well-nourished. No distress.  HENT:  Head: Normocephalic and atraumatic.  Right Ear: External ear normal.  Left Ear: External ear normal.  Nose: Nose normal.  Mouth/Throat: Oropharynx is clear and moist. No oropharyngeal exudate.       Rash above L eye and on eyelid  Vesicular / red / slt swelling   Eyes: Conjunctivae normal and EOM are normal. Pupils are equal, round, and reactive to light. Right eye exhibits no discharge. Left eye exhibits no discharge.  No scleral icterus.       No conj redness or swelling No vision change  Neck: Normal range of motion. Neck supple. No thyromegaly present.       Few ant cervical LN noted on L  Cardiovascular: Normal rate and regular rhythm.   Pulmonary/Chest: Effort normal and breath sounds normal.       Diffusely distant bs   Lymphadenopathy:    She has cervical adenopathy.  Neurological: She is alert and oriented to person, place, and time. She has normal reflexes. No cranial nerve deficit.  Skin: Skin is warm and dry. Rash noted. There is erythema.       See HENT exam for rash   Psychiatric: She has a normal mood and affect.          Assessment & Plan:

## 2012-08-09 NOTE — Patient Instructions (Addendum)
I think you have shingles  Take the valtrex as directed- finish it all We will do a referral to eye doctor today If more pain or any vision change- let me know

## 2012-08-19 ENCOUNTER — Telehealth: Payer: Self-pay | Admitting: Family Medicine

## 2012-08-19 NOTE — Telephone Encounter (Signed)
Pain can occur after the rash- if this worsens or if rash returns please f/u

## 2012-08-19 NOTE — Telephone Encounter (Signed)
Caller: Lorie/Patient; Patient Name: Glenford Bayley; PCP: Roxy Manns [facility]); Best Callback Phone Number: [phone] Onset-08/09/12   Afebrile. Pt was dx with Shingles and reports that pain had stopped and now has returned in all the areas present before.  She rates pain at a 4.  She is finished with Valtrex. States she takes a 1/2 Vicodin pill at night. Pt states areas have healed, and only one place still  has slight redness. Emergent s/s of Skin Lesion protocol r/o. Pt wants to know if this pain can come back. Informed pt Shingles pain can liinger, message sent to provider.

## 2012-08-19 NOTE — Telephone Encounter (Signed)
Patient advised.

## 2012-11-29 ENCOUNTER — Encounter: Payer: Self-pay | Admitting: Family Medicine

## 2012-12-02 ENCOUNTER — Encounter: Payer: Self-pay | Admitting: Family Medicine

## 2012-12-06 ENCOUNTER — Other Ambulatory Visit: Payer: Self-pay | Admitting: Radiology

## 2012-12-06 ENCOUNTER — Encounter: Payer: Self-pay | Admitting: Family Medicine

## 2012-12-07 ENCOUNTER — Encounter: Payer: Self-pay | Admitting: Family Medicine

## 2012-12-21 ENCOUNTER — Ambulatory Visit (INDEPENDENT_AMBULATORY_CARE_PROVIDER_SITE_OTHER): Payer: BC Managed Care – PPO | Admitting: Family Medicine

## 2012-12-21 ENCOUNTER — Encounter: Payer: Self-pay | Admitting: Family Medicine

## 2012-12-21 ENCOUNTER — Telehealth: Payer: Self-pay

## 2012-12-21 VITALS — BP 134/84 | HR 80 | Temp 98.6°F | Resp 97 | Ht 69.0 in | Wt 180.2 lb

## 2012-12-21 DIAGNOSIS — B029 Zoster without complications: Secondary | ICD-10-CM

## 2012-12-21 MED ORDER — VALACYCLOVIR HCL 1 G PO TABS: 1000.0000 mg | ORAL_TABLET | Freq: Three times a day (TID) | ORAL | Status: DC

## 2012-12-21 NOTE — Telephone Encounter (Signed)
She should keep her appt please- I will see her then

## 2012-12-21 NOTE — Assessment & Plan Note (Signed)
Pt is having recurrent symptoms of this on L face around eye without vision change or rash  Will tx with valtrex 1 g tid  Adv to update asap if rash develops or if vision change Will also call if pain worsens  Will plan to do shingles vaccine in about 6 weeks if symptoms are resolved

## 2012-12-21 NOTE — Progress Notes (Signed)
Subjective:    Patient ID: Carolyn Cordova, female    DOB: 12/23/1960, 52 y.o.   MRN: [id number]  HPI Here with discomfort over her L eye  Last several days  Thinks this may be shingles again --this am is stabbing pain / she is worried about it   No rash at all yet   Vision is fine - just feels pressure sensation over her eye   No other explanation for the pain No injuries  No sinus problems   Stress level is moderate  Work is hard - coworker is a problem- she considers quitting   Patient Active Problem List  Diagnosis  . PURE HYPERCHOLESTEROLEMIA  . TOBACCO ABUSE  . CARPAL TUNNEL SYNDROME, BILATERAL  . HYPERTENSION  . HEMORRHOIDS  . DYSPLASIA, CERVIX NOS  . HERNIATED CERVICAL DISC  . MIGRAINES, HX OF  . Thrush  . Yeast vaginitis  . Atrophic vaginitis  . Lumbar strain  . Dry mouth  . Elevated rheumatoid factor  . Herpes zoster   Past Medical History  Diagnosis Date  . Hypertension   . PMDD (premenstrual dysphoric disorder)   . Migraine   . Carpal tunnel syndrome   . Cervical disc disease    Past Surgical History  Procedure Date  . Pilonidal cyst excision 1980s    x2  . Cryoablation 11/90    secondary to dysplasia  . Lipoma removal 2006  . Spine surgery 03/11    cervical spine surgery  . Plantar fasciitis 8/12    surgery with Dr Al Corpus   History  Substance Use Topics  . Smoking status: Current Every Day Smoker -- 1.0 packs/day    Types: Cigarettes  . Smokeless tobacco: Never Used  . Alcohol Use: No   Family History  Problem Relation Age of Onset  . Hyperlipidemia Mother   . Cancer Mother     breast CA  . Cancer Father     adrenal cancer  . Colon polyps Father   . Hyperlipidemia Father   . Hyperlipidemia Brother   . Cancer Cousin     breast CA   Allergies  Allergen Reactions  . Adhesive (Tape)   . Amoxicillin-Pot Clavulanate     REACTION: rash  . Codeine     REACTION: ? reaction  . Fexofenadine     REACTION: nausea  . Sertraline Hcl      REACTION: sedation  . Varenicline Tartrate     REACTION: made her feel sick and did not work   Current Outpatient Prescriptions on File Prior to Visit  Medication Sig Dispense Refill  . amLODipine (NORVASC) 5 MG tablet Take 1 tablet (5 mg total) by mouth daily.  90 tablet  3  . aspirin 81 MG tablet Take 81 mg by mouth daily.        . cevimeline (EVOXAC) 30 MG capsule Take 30 mg by mouth 3 (three) times daily.      . rosuvastatin (CRESTOR) 10 MG tablet Take 1 tablet (10 mg total) by mouth at bedtime.  90 tablet  3  . Estradiol (VAGIFEM) 10 MCG TABS Place 1 tablet per vagina 2 times per week  30 tablet  12  . fluticasone (FLONASE) 50 MCG/ACT nasal spray Place 2 sprays into the nose daily.  16 g  12      Review of Systems    Review of Systems  Constitutional: Negative for fever, appetite change, fatigue and unexpected weight change.  Eyes: Negative for discharge  and visual  disturbance.  Respiratory: Negative for cough and shortness of breath.   Cardiovascular: Negative for cp or palpitations    Gastrointestinal: Negative for nausea, diarrhea and constipation.  Genitourinary: Negative for urgency and frequency.  Skin: Negative for pallor or rash   Neurological: Negative for weakness, light-headedness, numbness and headaches. pos for burning pain of skin on L side of face  Hematological: Negative for adenopathy. Does not bruise/bleed easily.  Psychiatric/Behavioral: Negative for dysphoric mood. The patient is not nervous/anxious.      Objective:   Physical Exam  Constitutional: She appears well-developed and well-nourished. No distress.  HENT:  Head: Normocephalic and atraumatic.  Right Ear: External ear normal.  Left Ear: External ear normal.  Nose: Nose normal.  Mouth/Throat: Oropharynx is clear and moist.       No sinus or temporal tenderness  Eyes: Conjunctivae normal and EOM are normal. Pupils are equal, round, and reactive to light. Right eye exhibits no discharge. Left  eye exhibits no discharge. No scleral icterus.       No conj changes No vision changes   Neck: Normal range of motion. Neck supple.  Cardiovascular: Normal rate and regular rhythm.   Pulmonary/Chest: Effort normal and breath sounds normal. No respiratory distress.       Diffusely distant bs   Lymphadenopathy:    She has no cervical adenopathy.  Neurological: She is alert. She has normal reflexes. No cranial nerve deficit.  Skin: Skin is warm and dry. No rash noted. No erythema.       Tenderness of skin around L eye and L forehead   Psychiatric: She has a normal mood and affect.          Assessment & Plan:

## 2012-12-21 NOTE — Patient Instructions (Addendum)
Let me know if you do break out in a rash or if any vision disturbance Take valtrex as directed  Schedule nurse visit 6 weeks for zostavax (if your insurance covers it elsewhere or you want to go to pharmacy instead cancel appt and tell us to call it in )

## 2012-12-21 NOTE — Telephone Encounter (Signed)
Left voicemail letting pt know to keep appt today

## 2012-12-21 NOTE — Telephone Encounter (Signed)
Pt left v/m; pt had shingles 07/2012 at eye; today pt has tingling and same feeling when had shingles in Sept but no rash yet; where had shingles at eye in Sept appear scaly today. Pt has already scheduled appt today at 12:30 but pt wants to know if should be seen today or wait until rash appears(area is at the eye)Please advise. Pt request call back.

## 2013-01-04 ENCOUNTER — Other Ambulatory Visit: Payer: Self-pay | Admitting: Family Medicine

## 2013-01-04 MED ORDER — ROSUVASTATIN CALCIUM 10 MG PO TABS: 10.0000 mg | ORAL_TABLET | Freq: Every day | ORAL | Status: DC

## 2013-02-03 ENCOUNTER — Other Ambulatory Visit: Payer: Self-pay | Admitting: Family Medicine

## 2013-02-21 ENCOUNTER — Encounter: Payer: Self-pay | Admitting: Internal Medicine

## 2013-02-24 ENCOUNTER — Encounter: Payer: Self-pay | Admitting: Internal Medicine

## 2013-05-15 ENCOUNTER — Other Ambulatory Visit: Payer: Self-pay | Admitting: Family Medicine

## 2013-05-16 NOTE — Telephone Encounter (Signed)
Please schedule f/u in august and refill until then  

## 2013-05-16 NOTE — Telephone Encounter (Signed)
Electronic refill request, no recent f/u or CPE appt and no future appt, please advise

## 2013-05-17 NOTE — Telephone Encounter (Signed)
Left voicemail requesting pt to call our office 

## 2013-05-17 NOTE — Telephone Encounter (Signed)
Pt scheduled appt 06/17/13 at 8:45 am. Refill done and pt advised.

## 2013-06-07 ENCOUNTER — Encounter: Payer: Self-pay | Admitting: Family Medicine

## 2013-06-17 ENCOUNTER — Ambulatory Visit (INDEPENDENT_AMBULATORY_CARE_PROVIDER_SITE_OTHER): Payer: BC Managed Care – PPO | Admitting: Family Medicine

## 2013-06-17 ENCOUNTER — Encounter: Payer: Self-pay | Admitting: Family Medicine

## 2013-06-17 VITALS — BP 130/86 | HR 81 | Temp 98.4°F | Wt 177.5 lb

## 2013-06-17 DIAGNOSIS — F17200 Nicotine dependence, unspecified, uncomplicated: Secondary | ICD-10-CM

## 2013-06-17 DIAGNOSIS — R21 Rash and other nonspecific skin eruption: Secondary | ICD-10-CM | POA: Insufficient documentation

## 2013-06-17 DIAGNOSIS — I10 Essential (primary) hypertension: Secondary | ICD-10-CM

## 2013-06-17 DIAGNOSIS — Z23 Encounter for immunization: Secondary | ICD-10-CM

## 2013-06-17 DIAGNOSIS — K117 Disturbances of salivary secretion: Secondary | ICD-10-CM

## 2013-06-17 DIAGNOSIS — R682 Dry mouth, unspecified: Secondary | ICD-10-CM

## 2013-06-17 DIAGNOSIS — E7800 Pure hypercholesterolemia, unspecified: Secondary | ICD-10-CM

## 2013-06-17 LAB — LIPID PANEL
HDL: 36 mg/dL — ABNORMAL LOW (ref >39.00)
LDL Cholesterol: 66 mg/dL (ref 0–99)
Total CHOL/HDL Ratio: 3
Triglycerides: 71 mg/dL (ref 0.0–149.0)
VLDL: 14.2 mg/dL (ref 0.0–40.0)

## 2013-06-17 LAB — COMPREHENSIVE METABOLIC PANEL
ALT: 15 U/L (ref 0–35)
AST: 15 U/L (ref 0–37)
Alkaline Phosphatase: 90 U/L (ref 39–117)
CO2: 28 mEq/L (ref 19–32)
Creatinine, Ser: 0.7 mg/dL (ref 0.4–1.2)
Sodium: 141 mEq/L (ref 135–145)
Total Bilirubin: 0.6 mg/dL (ref 0.3–1.2)
Total Protein: 7 g/dL (ref 6.0–8.3)

## 2013-06-17 MED ORDER — ROSUVASTATIN CALCIUM 10 MG PO TABS: 10.0000 mg | ORAL_TABLET | Freq: Every day | ORAL | Status: DC

## 2013-06-17 MED ORDER — AMLODIPINE BESYLATE 5 MG PO TABS: 5.0000 mg | ORAL_TABLET | Freq: Every day | ORAL | Status: DC

## 2013-06-17 MED ORDER — CEVIMELINE HCL 30 MG PO CAPS: 30.0000 mg | ORAL_CAPSULE | Freq: Three times a day (TID) | ORAL | Status: DC

## 2013-06-17 NOTE — Progress Notes (Signed)
Subjective:    Patient ID: Carolyn Cordova, female    DOB: 06/07/1961, 52 y.o.   MRN: [id number]  HPI Here for f/u of chronic medical problems   Doing well overall  Busy summer   Got a bad sunburn from trip to the beach -now redness on her legs- and ankles  No insect bites    Wt is down 3 lb with bmi of 26---lost more after she gained to 189 Cut out soft drinks    bp is stable today  No cp or palpitations or headaches or edema  No side effects to medicines  BP Readings from Last 3 Encounters:  06/17/13 130/86  12/21/12 134/84  08/09/12 146/98     Hyperlipidemia On crestor Due for labs Diet- is better overall  Exercise-walks 5 plus miles per day at her job   Smoking -no change   Patient Active Problem List   Diagnosis Date Noted  . Herpes zoster 08/09/2012  . Dry mouth 04/20/2012  . Elevated rheumatoid factor 04/20/2012  . Lumbar strain 10/04/2011  . Atrophic vaginitis 10/01/2011  . Thrush 08/26/2011  . Yeast vaginitis 08/26/2011  . HERNIATED CERVICAL DISC 09/14/2009  . PURE HYPERCHOLESTEROLEMIA 05/10/2009  . TOBACCO ABUSE 08/19/2007  . CARPAL TUNNEL SYNDROME, BILATERAL 08/19/2007  . HYPERTENSION 08/19/2007  . HEMORRHOIDS 08/19/2007  . DYSPLASIA, CERVIX NOS 08/19/2007  . MIGRAINES, HX OF 08/19/2007   Past Medical History  Diagnosis Date  . Hypertension   . PMDD (premenstrual dysphoric disorder)   . Migraine   . Carpal tunnel syndrome   . Cervical disc disease    Past Surgical History  Procedure Laterality Date  . Pilonidal cyst excision  1980s    x2  . Cryoablation  11/90    secondary to dysplasia  . Lipoma removal  2006  . Spine surgery  03/11    cervical spine surgery  . Plantar fasciitis  8/12    surgery with Dr Al Corpus   History  Substance Use Topics  . Smoking status: Current Every Day Smoker -- 1.00 packs/day    Types: Cigarettes  . Smokeless tobacco: Never Used  . Alcohol Use: No   Family History  Problem Relation Age of Onset  .  Hyperlipidemia Mother   . Cancer Mother     breast CA  . Cancer Father     adrenal cancer  . Colon polyps Father   . Hyperlipidemia Father   . Hyperlipidemia Brother   . Cancer Cousin     breast CA   Allergies  Allergen Reactions  . Adhesive (Tape)   . Amoxicillin-Pot Clavulanate     REACTION: rash  . Codeine     REACTION: ? reaction  . Fexofenadine     REACTION: nausea  . Sertraline Hcl     REACTION: sedation  . Varenicline Tartrate     REACTION: made her feel sick and did not work   Current Outpatient Prescriptions on File Prior to Visit  Medication Sig Dispense Refill  . amLODipine (NORVASC) 5 MG tablet TAKE 1 TABLET DAILY  90 tablet  0  . aspirin 81 MG tablet Take 81 mg by mouth daily.        . cevimeline (EVOXAC) 30 MG capsule Take 30 mg by mouth 3 (three) times daily.      . Estradiol (VAGIFEM) 10 MCG TABS Place 1 tablet per vagina 2 times per week  30 tablet  12  . rosuvastatin (CRESTOR) 10 MG tablet Take 1 tablet (10 mg  total) by mouth at bedtime.  90 tablet  1  . valACYclovir (VALTREX) 1000 MG tablet Take 1 tablet (1,000 mg total) by mouth 3 (three) times daily.  21 tablet  0   No current facility-administered medications on file prior to visit.    Review of Systems Review of Systems  Constitutional: Negative for fever, appetite change, fatigue and unexpected weight change.  Eyes: Negative for pain and visual disturbance.  Respiratory: Negative for cough and shortness of breath.   Cardiovascular: Negative for cp or palpitations    Gastrointestinal: Negative for nausea, diarrhea and constipation.  Genitourinary: Negative for urgency and frequency.  Skin: Negative for pallor or rash   Neurological: Negative for weakness, light-headedness, numbness and headaches.  Hematological: Negative for adenopathy. Does not bruise/bleed easily.  Psychiatric/Behavioral: Negative for dysphoric mood. The patient is not nervous/anxious.         Objective:   Physical Exam   Constitutional: She appears well-developed and well-nourished. No distress.  obese and well appearing   HENT:  Head: Normocephalic and atraumatic.  Mouth/Throat: Oropharynx is clear and moist.  Eyes: Conjunctivae and EOM are normal. Pupils are equal, round, and reactive to light. No scleral icterus.  Neck: Normal range of motion. Neck supple. No JVD present. Carotid bruit is not present. No thyromegaly present.  Cardiovascular: Normal rate, regular rhythm, normal heart sounds and intact distal pulses.  Exam reveals no gallop.   Pulmonary/Chest: Effort normal and breath sounds normal. No respiratory distress. She has no wheezes. She exhibits no tenderness.  Abdominal: Soft. Bowel sounds are normal. She exhibits no distension, no abdominal bruit and no mass. There is no tenderness.  Musculoskeletal: She exhibits no edema and no tenderness.  Lymphadenopathy:    She has no cervical adenopathy.  Neurological: She is alert. She has normal reflexes. No cranial nerve deficit. She exhibits normal muscle tone. Coordination normal.  Skin: Skin is warm and dry. Rash noted. No erythema. No pallor.  Some patches of erythema on bilat lower legs-not raised/ no blanching or tenderness Recent sunburn noted  Psychiatric: She has a normal mood and affect.          Assessment & Plan:

## 2013-06-17 NOTE — Patient Instructions (Signed)
Schedule PE in the spring with labs prior please  Tetanus shot today  Think about quitting smoking  Lab today

## 2013-06-19 NOTE — Assessment & Plan Note (Signed)
On legs- appears to be dermatitis or poss a sun reaction- since it is improved-will obs Update if not starting to improve in a week or if worsening

## 2013-06-19 NOTE — Assessment & Plan Note (Signed)
Refilled med for this  S/p work up -? Etiology found Enc to quit smoking

## 2013-06-19 NOTE — Assessment & Plan Note (Signed)
bp in fair control at this time  No changes needed  Disc lifstyle change with low sodium diet and exercise  Lab today Enc to quit smoking

## 2013-06-19 NOTE — Assessment & Plan Note (Signed)
Disc in detail risks of smoking and possible outcomes including copd, vascular/ heart disease, cancer , respiratory and sinus infections  Pt voices understanding  Pt is not ready to quit  

## 2013-06-19 NOTE — Assessment & Plan Note (Signed)
Lab today crestor and diet Rev low sat fat diet Rev risks of high chol with smoking and urged to quit

## 2013-06-20 ENCOUNTER — Encounter: Payer: Self-pay | Admitting: *Deleted

## 2013-08-26 ENCOUNTER — Ambulatory Visit (INDEPENDENT_AMBULATORY_CARE_PROVIDER_SITE_OTHER): Payer: BC Managed Care – PPO

## 2013-08-26 DIAGNOSIS — Z23 Encounter for immunization: Secondary | ICD-10-CM

## 2014-02-22 ENCOUNTER — Other Ambulatory Visit: Payer: Self-pay

## 2014-02-22 ENCOUNTER — Telehealth: Payer: Self-pay | Admitting: Family Medicine

## 2014-02-22 MED ORDER — AMLODIPINE BESYLATE 5 MG PO TABS: 5.0000 mg | ORAL_TABLET | Freq: Every day | ORAL | Status: DC

## 2014-02-22 MED ORDER — ROSUVASTATIN CALCIUM 10 MG PO TABS: 10.0000 mg | ORAL_TABLET | Freq: Every day | ORAL | Status: DC

## 2014-02-22 MED ORDER — CEVIMELINE HCL 30 MG PO CAPS: 30.0000 mg | ORAL_CAPSULE | Freq: Three times a day (TID) | ORAL | Status: DC

## 2014-02-22 NOTE — Telephone Encounter (Signed)
Pt has changed insurance and request new rx sent to [facility] for amlodipine,Evoxac and Crestor. Pt seen 06/2013 and advised to schedule PE with labs prior in spring; Morey [facility] scheduling PE appt. Is OK to refill meds? Pt will be out of meds by end of week; Please advise.

## 2014-02-22 NOTE — Telephone Encounter (Signed)
That is fine- any 30 minute slot- tell her we can do her health mt and discuss chronic problems  Thanks

## 2014-02-22 NOTE — Telephone Encounter (Signed)
Patient called to schedule her physical. When she was told it would be September before she could be seen, she didn't want to wait that long.  She said she's had insurance problems and her work schedule and she waited to call until she knew she could come in.  Can patient be seen sooner than September for her physical?

## 2014-03-09 ENCOUNTER — Telehealth: Payer: Self-pay | Admitting: Family Medicine

## 2014-03-09 DIAGNOSIS — Z0000 Encounter for general adult medical examination without abnormal findings: Secondary | ICD-10-CM | POA: Insufficient documentation

## 2014-03-09 NOTE — Telephone Encounter (Signed)
Message copied by Abner Greenspan on Thu Mar 09, 2014  6:01 PM ------      Message from: Ellamae Sia      Created: Thu Mar 02, 2014 10:38 AM      Regarding: Lab orders for Friday, 4.24.15       Patient is scheduled for CPX labs, please order future labs, Thanks , Carolyn Cordova       ------

## 2014-03-10 ENCOUNTER — Other Ambulatory Visit (INDEPENDENT_AMBULATORY_CARE_PROVIDER_SITE_OTHER): Payer: BC Managed Care – PPO

## 2014-03-10 DIAGNOSIS — Z0000 Encounter for general adult medical examination without abnormal findings: Secondary | ICD-10-CM

## 2014-03-10 LAB — COMPREHENSIVE METABOLIC PANEL
ALK PHOS: 86 U/L (ref 39–117)
ALT: 20 U/L (ref 0–35)
AST: 17 U/L (ref 0–37)
Albumin: 4.6 g/dL (ref 3.5–5.2)
BILIRUBIN TOTAL: 0.6 mg/dL (ref 0.3–1.2)
BUN: 13 mg/dL (ref 6–23)
CO2: 26 meq/L (ref 19–32)
CREATININE: 0.7 mg/dL (ref 0.4–1.2)
Calcium: 9.8 mg/dL (ref 8.4–10.5)
Chloride: 106 mEq/L (ref 96–112)
GFR: 97.74 mL/min (ref >60.00)
GLUCOSE: 86 mg/dL (ref 70–99)
Potassium: 4.1 mEq/L (ref 3.5–5.1)
SODIUM: 140 meq/L (ref 135–145)
TOTAL PROTEIN: 7.3 g/dL (ref 6.0–8.3)

## 2014-03-10 LAB — CBC WITH DIFFERENTIAL/PLATELET
Basophils Absolute: 0.1 10*3/uL (ref 0.0–0.1)
Basophils Relative: 0.9 % (ref 0.0–3.0)
Eosinophils Absolute: 0.3 10*3/uL (ref 0.0–0.7)
Eosinophils Relative: 5.1 % — ABNORMAL HIGH (ref 0.0–5.0)
HEMATOCRIT: 46 % (ref 36.0–46.0)
HEMOGLOBIN: 15.7 g/dL — AB (ref 12.0–15.0)
LYMPHS ABS: 2.5 10*3/uL (ref 0.7–4.0)
Lymphocytes Relative: 39.8 % (ref 12.0–46.0)
MCHC: 34 g/dL (ref 30.0–36.0)
MCV: 91.7 fl (ref 78.0–100.0)
MONO ABS: 0.5 10*3/uL (ref 0.1–1.0)
MONOS PCT: 7.8 % (ref 3.0–12.0)
NEUTROS ABS: 3 10*3/uL (ref 1.4–7.7)
Neutrophils Relative %: 46.4 % (ref 43.0–77.0)
PLATELETS: 244 10*3/uL (ref 150.0–400.0)
RBC: 5.02 Mil/uL (ref 3.87–5.11)
RDW: 12.7 % (ref 11.5–14.6)
WBC: 6.4 10*3/uL (ref 4.5–10.5)

## 2014-03-10 LAB — LIPID PANEL
CHOLESTEROL: 131 mg/dL (ref 0–200)
HDL: 42.6 mg/dL (ref >39.00)
LDL Cholesterol: 73 mg/dL (ref 0–99)
Total CHOL/HDL Ratio: 3
Triglycerides: 77 mg/dL (ref 0.0–149.0)
VLDL: 15.4 mg/dL (ref 0.0–40.0)

## 2014-03-10 LAB — TSH: TSH: 0.79 u[IU]/mL (ref 0.35–5.50)

## 2014-03-17 ENCOUNTER — Encounter: Payer: Self-pay | Admitting: Family Medicine

## 2014-03-17 ENCOUNTER — Other Ambulatory Visit (HOSPITAL_COMMUNITY)
Admission: RE | Admit: 2014-03-17 | Discharge: 2014-03-17 | Disposition: A | Discharge status: Home / Self Care | Payer: BC Managed Care – PPO | Source: Ambulatory Visit | Attending: Family Medicine | Admitting: Family Medicine

## 2014-03-17 ENCOUNTER — Ambulatory Visit (INDEPENDENT_AMBULATORY_CARE_PROVIDER_SITE_OTHER): Payer: BC Managed Care – PPO | Admitting: Family Medicine

## 2014-03-17 VITALS — BP 130/80 | HR 82 | Temp 98.0°F | Ht 68.0 in | Wt 175.5 lb

## 2014-03-17 DIAGNOSIS — Z01419 Encounter for gynecological examination (general) (routine) without abnormal findings: Secondary | ICD-10-CM | POA: Insufficient documentation

## 2014-03-17 DIAGNOSIS — E7800 Pure hypercholesterolemia, unspecified: Secondary | ICD-10-CM

## 2014-03-17 DIAGNOSIS — Z0000 Encounter for general adult medical examination without abnormal findings: Secondary | ICD-10-CM

## 2014-03-17 DIAGNOSIS — F17200 Nicotine dependence, unspecified, uncomplicated: Secondary | ICD-10-CM

## 2014-03-17 DIAGNOSIS — I10 Essential (primary) hypertension: Secondary | ICD-10-CM

## 2014-03-17 DIAGNOSIS — Z1151 Encounter for screening for human papillomavirus (HPV): Secondary | ICD-10-CM | POA: Insufficient documentation

## 2014-03-17 MED ORDER — ROSUVASTATIN CALCIUM 10 MG PO TABS: 10.0000 mg | ORAL_TABLET | Freq: Every day | ORAL | Status: DC

## 2014-03-17 MED ORDER — ESTRADIOL 10 MCG VA TABS: ORAL_TABLET | VAGINAL | Status: DC

## 2014-03-17 MED ORDER — AMLODIPINE BESYLATE 5 MG PO TABS: 5.0000 mg | ORAL_TABLET | Freq: Every day | ORAL | Status: DC

## 2014-03-17 MED ORDER — CEVIMELINE HCL 30 MG PO CAPS: 30.0000 mg | ORAL_CAPSULE | Freq: Three times a day (TID) | ORAL | Status: DC

## 2014-03-17 NOTE — Progress Notes (Signed)
Subjective:    Patient ID: Carolyn Cordova, female    DOB: [birth year], 53 y.o.   MRN: [id number]  HPI Here for health maintenance exam and to review chronic medical problems    Lots of stressors lately  Got a promotion- but there will be more work  Her daughter is going to college  husb is trying to get disability for neurop from his cancer tx  Also hot flashes   Wt is down 2 lb with bmi of 26- she has cut out soft drinks   Pap 2/12 Wants to do that today    mammo 7/14 neg-had neg bx 1/14- is due in July - will make her own appt  Mother had breast ca Self exam no new lumps (she does have a marker in breast that she can feel)   Flu shot 10/14 Pneumovax 1/10 - will get that next year  Wants to get her zoster vaccine when it is covered -she will check with her ins  colonosc 7/06 -hyperplastic polyp   Td 8/14  bp is stable today  No cp or palpitations or headaches or edema  No side effects to medicines  BP Readings from Last 3 Encounters:  03/17/14 136/92  06/17/13 130/86  12/21/12 134/84     Hyperlipidemia  Lab Results  Component Value Date   CHOL 131 03/10/2014   CHOL 116 06/17/2013   CHOL 179 05/21/2012   Lab Results  Component Value Date   HDL 42.60 03/10/2014   HDL 36.00* 06/17/2013   HDL 41.70 05/21/2012   Lab Results  Component Value Date   LDLCALC 73 03/10/2014   LDLCALC 66 06/17/2013   LDLCALC 109* 05/21/2012   Lab Results  Component Value Date   TRIG 77.0 03/10/2014   TRIG 71.0 06/17/2013   TRIG 141.0 05/21/2012   Lab Results  Component Value Date   CHOLHDL 3 03/10/2014   CHOLHDL 3 06/17/2013   CHOLHDL 4 05/21/2012   Lab Results  Component Value Date   LDLDIRECT 217.5 12/11/2010   LDLDIRECT 185.0 05/10/2009   LDLDIRECT 188.3 03/05/2009   looking good with her crestor   Diet is better - she has changed to salads at work for lunch  Making an effort     Chemistry      Component Value Date/Time   NA 140 03/10/2014 0800   K 4.1 03/10/2014 0800   CL 106  03/10/2014 0800   CO2 26 03/10/2014 0800   BUN 13 03/10/2014 0800   CREATININE 0.7 03/10/2014 0800      Component Value Date/Time   CALCIUM 9.8 03/10/2014 0800   ALKPHOS 86 03/10/2014 0800   AST 17 03/10/2014 0800   ALT 20 03/10/2014 0800   BILITOT 0.6 03/10/2014 0800      She had neg RF in the past but still aches all over at times  There was a ? Re: poss autoimmune dz in the past   Patient Active Problem List   Diagnosis Date Noted  . Encounter for routine gynecological examination 03/17/2014  . Routine general medical examination at a health care facility 03/09/2014  . Rash and nonspecific skin eruption 06/17/2013  . Herpes zoster 08/09/2012  . Dry mouth 04/20/2012  . Elevated rheumatoid factor 04/20/2012  . Lumbar strain 10/04/2011  . Atrophic vaginitis 10/01/2011  . Thrush 08/26/2011  . Yeast vaginitis 08/26/2011  . HERNIATED CERVICAL DISC 09/14/2009  . PURE HYPERCHOLESTEROLEMIA 05/10/2009  . TOBACCO ABUSE 08/19/2007  . CARPAL TUNNEL SYNDROME, BILATERAL  08/19/2007  . HYPERTENSION 08/19/2007  . HEMORRHOIDS 08/19/2007  . DYSPLASIA, CERVIX NOS 08/19/2007  . MIGRAINES, HX OF 08/19/2007   Past Medical History  Diagnosis Date  . Hypertension   . PMDD (premenstrual dysphoric disorder)   . Migraine   . Carpal tunnel syndrome   . Cervical disc disease    Past Surgical History  Procedure Laterality Date  . Pilonidal cyst excision  1980s    x2  . Cryoablation  11/90    secondary to dysplasia  . Lipoma removal  2006  . Spine surgery  03/11    cervical spine surgery  . Plantar fasciitis  8/12    surgery with Dr Milinda Pointer   History  Substance Use Topics  . Smoking status: Current Every Day Smoker -- 1.00 packs/day    Types: Cigarettes  . Smokeless tobacco: Never Used  . Alcohol Use: No   Family History  Problem Relation Age of Onset  . Hyperlipidemia Mother   . Cancer Mother     breast CA  . Cancer Father     adrenal cancer  . Colon polyps Father   . Hyperlipidemia  Father   . Hyperlipidemia Brother   . Cancer Cousin     breast CA   Allergies  Allergen Reactions  . Adhesive [Tape]   . Amoxicillin-Pot Clavulanate     REACTION: rash  . Codeine     REACTION: ? reaction  . Fexofenadine     REACTION: nausea  . Sertraline Hcl     REACTION: sedation  . Varenicline Tartrate     REACTION: made her feel sick and did not work   Current Outpatient Prescriptions on File Prior to Visit  Medication Sig Dispense Refill  . aspirin 81 MG tablet Take 81 mg by mouth daily.         No current facility-administered medications on file prior to visit.    Review of Systems Review of Systems  Constitutional: Negative for fever, appetite change,  and unexpected weight change.  Eyes: Negative for pain and visual disturbance.  Respiratory: Negative for cough and shortness of breath.   Cardiovascular: Negative for cp or palpitations    Gastrointestinal: Negative for nausea, diarrhea and constipation.  Genitourinary: Negative for urgency and frequency.  Skin: Negative for pallor or rash   Neurological: Negative for weakness, light-headedness, numbness and headaches.  Hematological: Negative for adenopathy. Does not bruise/bleed easily.  Psychiatric/Behavioral: Negative for dysphoric mood. The patient is not nervous/anxious.         Objective:   Physical Exam  Constitutional: She appears well-developed and well-nourished. No distress.  overwt and well app  HENT:  Head: Normocephalic and atraumatic.  Right Ear: External ear normal.  Left Ear: External ear normal.  Nose: Nose normal.  Mouth/Throat: Oropharynx is clear and moist.  Boggy nares   Eyes: Conjunctivae and EOM are normal. Pupils are equal, round, and reactive to light. Right eye exhibits no discharge. Left eye exhibits no discharge. No scleral icterus.  Neck: Normal range of motion. Neck supple. No JVD present. No thyromegaly present.  Cardiovascular: Normal rate, regular rhythm, normal heart  sounds and intact distal pulses.  Exam reveals no gallop.   Pulmonary/Chest: Effort normal and breath sounds normal. No respiratory distress. She has no wheezes. She has no rales.  Abdominal: Soft. Bowel sounds are normal. She exhibits no distension and no mass. There is no tenderness.  Genitourinary: Vagina normal and uterus normal. No breast swelling, tenderness, discharge or bleeding. There  is no rash, tenderness or lesion on the right labia. There is no rash, tenderness or lesion on the left labia. Uterus is not enlarged and not tender. Cervix exhibits no motion tenderness, no discharge and no friability. Right adnexum displays no mass, no tenderness and no fullness. Left adnexum displays no mass, no tenderness and no fullness. No bleeding around the vagina. No vaginal discharge found.  Breast exam: No mass, nodules, thickening, tenderness, bulging, retraction, inflamation, nipple discharge or skin changes noted.  No axillary or clavicular LA.      Musculoskeletal: She exhibits no edema and no tenderness.  No acute joint changes   Lymphadenopathy:    She has no cervical adenopathy.  Neurological: She is alert. She has normal reflexes. No cranial nerve deficit. She exhibits normal muscle tone. Coordination normal.  Skin: Skin is warm and dry. No rash noted. No erythema. No pallor.  Psychiatric: She has a normal mood and affect.          Assessment & Plan:

## 2014-03-17 NOTE — Patient Instructions (Addendum)
Pap today  Keep working on healthy diet and exercise  Keep thinking about quitting smoking  Don't forget to schedule your mammogram in July

## 2014-03-17 NOTE — Progress Notes (Signed)
Pre visit review using our clinic review tool, if applicable. No additional management support is needed unless otherwise documented below in the visit note. 

## 2014-03-19 NOTE — Assessment & Plan Note (Signed)
Reviewed health habits including diet and exercise and skin cancer prevention Reviewed appropriate screening tests for age  Also reviewed health mt list, fam hx and immunization status , as well as social and family history   Labs rev Counseled on smoking cessation

## 2014-03-19 NOTE — Assessment & Plan Note (Signed)
Disc in detail risks of smoking and possible outcomes including copd, vascular/ heart disease, cancer , respiratory and sinus infections  Pt voices understanding  She is not yet ready to quit  

## 2014-03-19 NOTE — Assessment & Plan Note (Signed)
Controlled with crestor and diet Disc goals for lipids and reasons to control them Rev labs with pt Rev low sat fat diet in detail

## 2014-03-19 NOTE — Assessment & Plan Note (Signed)
Exam done No gyn problems  Pap sent

## 2014-03-19 NOTE — Assessment & Plan Note (Signed)
bp in fair control at this time  BP Readings from Last 1 Encounters:  03/17/14 130/80   No changes needed Disc lifstyle change with low sodium diet and exercise  Labs reviewed

## 2014-03-20 ENCOUNTER — Telehealth: Payer: Self-pay | Admitting: Family Medicine

## 2014-03-20 NOTE — Telephone Encounter (Signed)
Relevant patient education assigned to patient using Emmi. ° °

## 2014-03-21 LAB — HM PAP SMEAR: HM Pap smear: NORMAL

## 2014-03-23 ENCOUNTER — Encounter: Payer: Self-pay | Admitting: *Deleted

## 2014-03-23 ENCOUNTER — Encounter: Payer: Self-pay | Admitting: Family Medicine

## 2015-04-05 ENCOUNTER — Other Ambulatory Visit: Payer: Self-pay | Admitting: Family Medicine

## 2015-04-05 NOTE — Telephone Encounter (Signed)
Electronic refill request, no future appt scheduled and last appt was a CPE on 03/17/14, please advise

## 2015-04-05 NOTE — Telephone Encounter (Signed)
appt scheduled and med refilled 

## 2015-04-05 NOTE — Telephone Encounter (Signed)
Please schedule f/u this summer and refill until then  

## 2015-04-10 ENCOUNTER — Encounter: Payer: Self-pay | Admitting: Family Medicine

## 2015-04-10 ENCOUNTER — Ambulatory Visit (INDEPENDENT_AMBULATORY_CARE_PROVIDER_SITE_OTHER): Payer: BLUE CROSS/BLUE SHIELD | Admitting: Family Medicine

## 2015-04-10 VITALS — BP 130/80 | HR 89 | Temp 98.1°F | Ht 68.0 in | Wt 179.0 lb

## 2015-04-10 DIAGNOSIS — I10 Essential (primary) hypertension: Secondary | ICD-10-CM | POA: Diagnosis not present

## 2015-04-10 DIAGNOSIS — Z720 Tobacco use: Secondary | ICD-10-CM

## 2015-04-10 DIAGNOSIS — Z1211 Encounter for screening for malignant neoplasm of colon: Secondary | ICD-10-CM | POA: Insufficient documentation

## 2015-04-10 DIAGNOSIS — E7800 Pure hypercholesterolemia, unspecified: Secondary | ICD-10-CM

## 2015-04-10 DIAGNOSIS — K648 Other hemorrhoids: Secondary | ICD-10-CM

## 2015-04-10 DIAGNOSIS — F17200 Nicotine dependence, unspecified, uncomplicated: Secondary | ICD-10-CM

## 2015-04-10 LAB — LIPID PANEL
Cholesterol: 175 mg/dL (ref 0–200)
HDL: 41.7 mg/dL
LDL Cholesterol: 113 mg/dL — ABNORMAL HIGH (ref 0–99)
NonHDL: 133.3
Total CHOL/HDL Ratio: 4
Triglycerides: 101 mg/dL (ref 0.0–149.0)
VLDL: 20.2 mg/dL (ref 0.0–40.0)

## 2015-04-10 LAB — CBC WITH DIFFERENTIAL/PLATELET
BASOS ABS: 0 10*3/uL (ref 0.0–0.1)
Basophils Relative: 0.7 % (ref 0.0–3.0)
EOS ABS: 0.2 10*3/uL (ref 0.0–0.7)
Eosinophils Relative: 3.5 % (ref 0.0–5.0)
HEMATOCRIT: 45.7 % (ref 36.0–46.0)
Hemoglobin: 16 g/dL — ABNORMAL HIGH (ref 12.0–15.0)
LYMPHS PCT: 40.4 % (ref 12.0–46.0)
Lymphs Abs: 2.5 10*3/uL (ref 0.7–4.0)
MCHC: 34.9 g/dL (ref 30.0–36.0)
MCV: 88.8 fl (ref 78.0–100.0)
Monocytes Absolute: 0.5 10*3/uL (ref 0.1–1.0)
Monocytes Relative: 7.4 % (ref 3.0–12.0)
NEUTROS ABS: 2.9 10*3/uL (ref 1.4–7.7)
Neutrophils Relative %: 48 % (ref 43.0–77.0)
Platelets: 245 10*3/uL (ref 150.0–400.0)
RBC: 5.15 Mil/uL — ABNORMAL HIGH (ref 3.87–5.11)
RDW: 12.9 % (ref 11.5–15.5)
WBC: 6.1 10*3/uL (ref 4.0–10.5)

## 2015-04-10 LAB — COMPREHENSIVE METABOLIC PANEL
ALT: 13 U/L (ref 0–35)
AST: 12 U/L (ref 0–37)
Albumin: 4.6 g/dL (ref 3.5–5.2)
Alkaline Phosphatase: 91 U/L (ref 39–117)
BUN: 12 mg/dL (ref 6–23)
CHLORIDE: 105 meq/L (ref 96–112)
CO2: 29 mEq/L (ref 19–32)
Calcium: 9.4 mg/dL (ref 8.4–10.5)
Creatinine, Ser: 0.6 mg/dL (ref 0.40–1.20)
GFR: 110.56 mL/min (ref >60.00)
GLUCOSE: 100 mg/dL — AB (ref 70–99)
POTASSIUM: 4.3 meq/L (ref 3.5–5.1)
Sodium: 139 mEq/L (ref 135–145)
TOTAL PROTEIN: 6.8 g/dL (ref 6.0–8.3)
Total Bilirubin: 0.5 mg/dL (ref 0.2–1.2)

## 2015-04-10 LAB — TSH: TSH: 0.82 u[IU]/mL (ref 0.35–4.50)

## 2015-04-10 MED ORDER — HYDROCORTISONE ACETATE 25 MG RE SUPP: 25.0000 mg | Freq: Every day | RECTAL | Status: DC

## 2015-04-10 NOTE — Patient Instructions (Signed)
Labs today  Think about quitting smoking when you are ready  Stop at check out for referral for colonoscopy Don't forget to schedule your mammogram

## 2015-04-10 NOTE — Progress Notes (Signed)
Pre visit review using our clinic review tool, if applicable. No additional management support is needed unless otherwise documented below in the visit note. 

## 2015-04-10 NOTE — Progress Notes (Signed)
Subjective:    Patient ID: Carolyn Cordova, female    DOB: [birth year], 54 y.o.   MRN: [id number]  HPI Here for f/u of chronic health problems   Doing well overall  Has had some issues with hemorrhoids in the past several weeks  Of note- colonoscopy is overdue -had a polyp (high deductable now)    Working a lot since husband is retired/on disability   Wt is up 4 lb with bmi of 27  bp is stable today  No cp or palpitations or headaches or edema  No side effects to medicines  BP Readings from Last 3 Encounters:  04/10/15 136/84  03/17/14 130/80  06/17/13 130/86      Smoking status - is the same / no plans to quit right now  Stress -work and teen daughter   Cholesterol  On crestor and diet  Lab Results  Component Value Date   CHOL 131 03/10/2014   HDL 42.60 03/10/2014   LDLCALC 73 03/10/2014   LDLDIRECT 217.5 12/11/2010   TRIG 77.0 03/10/2014   CHOLHDL 3 03/10/2014   eating very healthy- salads and chicken  Doing better overall  Watches calories also   Walks 2-10 mi per day at work   Will schedule own mammogram- due for that  No lumps on self exam  Patient Active Problem List   Diagnosis Date Noted  . Colon cancer screening 04/10/2015  . Internal hemorrhoid 04/10/2015  . Encounter for routine gynecological examination 03/17/2014  . Routine general medical examination at a health care facility 03/09/2014  . Herpes zoster 08/09/2012  . Dry mouth 04/20/2012  . Elevated rheumatoid factor 04/20/2012  . Lumbar strain 10/04/2011  . Atrophic vaginitis 10/01/2011  . HERNIATED CERVICAL DISC 09/14/2009  . PURE HYPERCHOLESTEROLEMIA 05/10/2009  . TOBACCO ABUSE 08/19/2007  . CARPAL TUNNEL SYNDROME, BILATERAL 08/19/2007  . Essential hypertension 08/19/2007  . HEMORRHOIDS 08/19/2007  . History of cervical dysplasia 08/19/2007  . MIGRAINES, HX OF 08/19/2007   Past Medical History  Diagnosis Date  . Hypertension   . PMDD (premenstrual dysphoric disorder)   .  Migraine   . Carpal tunnel syndrome   . Cervical disc disease    Past Surgical History  Procedure Laterality Date  . Pilonidal cyst excision  1980s    x2  . Cryoablation  11/90    secondary to dysplasia  . Lipoma removal  2006  . Spine surgery  03/11    cervical spine surgery  . Plantar fasciitis  8/12    surgery with Dr Milinda Pointer   History  Substance Use Topics  . Smoking status: Current Every Day Smoker -- 1.00 packs/day    Types: Cigarettes  . Smokeless tobacco: Never Used  . Alcohol Use: No   Family History  Problem Relation Age of Onset  . Hyperlipidemia Mother   . Cancer Mother     breast CA  . Cancer Father     adrenal cancer  . Colon polyps Father   . Hyperlipidemia Father   . Hyperlipidemia Brother   . Cancer Cousin     breast CA   Allergies  Allergen Reactions  . Adhesive [Tape]   . Amoxicillin-Pot Clavulanate     REACTION: rash  . Codeine     REACTION: ? reaction  . Fexofenadine     REACTION: nausea  . Sertraline Hcl     REACTION: sedation  . Varenicline Tartrate     REACTION: made her feel sick and did not work  Current Outpatient Prescriptions on File Prior to Visit  Medication Sig Dispense Refill  . amLODipine (NORVASC) 5 MG tablet TAKE 1 TABLET BY MOUTH DAILY 30 tablet 0  . aspirin 81 MG tablet Take 81 mg by mouth daily.      . cevimeline (EVOXAC) 30 MG capsule TAKE 1 CAPSULE BY MOUTH THREE TIMES A DAY 90 capsule 0  . CRESTOR 10 MG tablet TAKE ONE TABLET BY MOUTH EVERY NIGHT AT BEDTIME 30 tablet 0  . Estradiol (VAGIFEM) 10 MCG TABS vaginal tablet Place 1 tablet per vagina 2 times per week 8 tablet 11   No current facility-administered medications on file prior to visit.    Review of Systems    Review of Systems  Constitutional: Negative for fever, appetite change, fatigue and unexpected weight change.  Eyes: Negative for pain and visual disturbance.  ENT pos for dry mouth (baseline) Respiratory: Negative for cough and shortness of breath.    Cardiovascular: Negative for cp or palpitations    Gastrointestinal: Negative for nausea, diarrhea and constipation.  Genitourinary: Negative for urgency and frequency.  Skin: Negative for pallor or rash   Neurological: Negative for weakness, light-headedness, numbness and headaches.  Hematological: Negative for adenopathy. Does not bruise/bleed easily.  Psychiatric/Behavioral: Negative for dysphoric mood. The patient is not nervous/anxious.  pos for stressors     Objective:   Physical Exam  Constitutional: She appears well-developed and well-nourished. No distress.  obese and well appearing   HENT:  Head: Normocephalic and atraumatic.  Mouth/Throat: Oropharynx is clear and moist.  Eyes: Conjunctivae and EOM are normal. Pupils are equal, round, and reactive to light.  Neck: Normal range of motion. Neck supple. No JVD present. Carotid bruit is not present. No thyromegaly present.  Cardiovascular: Normal rate, regular rhythm, normal heart sounds and intact distal pulses.  Exam reveals no gallop.   Pulmonary/Chest: Effort normal and breath sounds normal. No respiratory distress. She has no wheezes. She has no rales.  No crackles  No wheeze/good air exch  Abdominal: Soft. Bowel sounds are normal. She exhibits no distension, no abdominal bruit and no mass. There is no tenderness.  Musculoskeletal: She exhibits no edema.  Lymphadenopathy:    She has no cervical adenopathy.  Neurological: She is alert. She has normal reflexes.  Skin: Skin is warm and dry. No rash noted.  Psychiatric: She has a normal mood and affect.          Assessment & Plan:   Problem List Items Addressed This Visit    Colon cancer screening    Due for screening colonoscopy  referrted to GI      Relevant Orders   Ambulatory referral to Gastroenterology   Essential hypertension - Primary    bp in fair control at this time  BP Readings from Last 1 Encounters:  04/10/15 130/80   No changes needed Disc  lifstyle change with low sodium diet and exercise  Enc her to keep up the exercise Disc sodium avoidance/DASH diet and enc to quit smoking       Relevant Orders   CBC with Differential/Platelet (Completed)   Comprehensive metabolic panel (Completed)   TSH (Completed)   Lipid panel (Completed)   Internal hemorrhoid    occ brief brb in stool  Also due for a colonoscopy  Px anusol hc suppositories to use nightly for 10 days Counseled on avoidance of straining and keeping stools soft       PURE HYPERCHOLESTEROLEMIA    Due for lipid  panel today  Disc goals for lipids and reasons to control them Rev labs with pt (from last draw) Rev low sat fat diet in detail  Taking crestor        Relevant Orders   Lipid panel (Completed)   TOBACCO ABUSE    Disc in detail risks of smoking and possible outcomes including copd, vascular/ heart disease, cancer , respiratory and sinus infections  Pt voices understanding  She is not ready to quit now but would like to in the future Offered help when ready

## 2015-04-11 ENCOUNTER — Ambulatory Visit (AMBULATORY_SURGERY_CENTER): Payer: Self-pay

## 2015-04-11 ENCOUNTER — Encounter: Payer: Self-pay | Admitting: *Deleted

## 2015-04-11 VITALS — Ht 69.0 in | Wt 178.8 lb

## 2015-04-11 DIAGNOSIS — Z860100 Personal history of colon polyps, unspecified: Secondary | ICD-10-CM

## 2015-04-11 MED ORDER — MOVIPREP 100 G PO SOLR: 1.0000 | Freq: Once | ORAL | Status: DC

## 2015-04-11 NOTE — Assessment & Plan Note (Signed)
Due for lipid panel today  Disc goals for lipids and reasons to control them Rev labs with pt (from last draw) Rev low sat fat diet in detail  Taking crestor

## 2015-04-11 NOTE — Assessment & Plan Note (Signed)
Disc in detail risks of smoking and possible outcomes including copd, vascular/ heart disease, cancer , respiratory and sinus infections  Pt voices understanding  She is not ready to quit now but would like to in the future Offered help when ready

## 2015-04-11 NOTE — Progress Notes (Signed)
No allergies to eggs or soy No diet/weight loss meds No home oxygen No past problems with anesthesia  Has email  Emmi instructions given for colonoscopy 

## 2015-04-11 NOTE — Assessment & Plan Note (Signed)
bp in fair control at this time  BP Readings from Last 1 Encounters:  04/10/15 130/80   No changes needed Disc lifstyle change with low sodium diet and exercise  Enc her to keep up the exercise Disc sodium avoidance/DASH diet and enc to quit smoking

## 2015-04-11 NOTE — Assessment & Plan Note (Signed)
occ brief brb in stool  Also due for a colonoscopy  Px anusol hc suppositories to use nightly for 10 days Counseled on avoidance of straining and keeping stools soft

## 2015-04-11 NOTE — Assessment & Plan Note (Signed)
Due for screening colonoscopy  referrted to GI

## 2015-04-19 ENCOUNTER — Ambulatory Visit (AMBULATORY_SURGERY_CENTER): Payer: BLUE CROSS/BLUE SHIELD | Admitting: Internal Medicine

## 2015-04-19 ENCOUNTER — Encounter: Payer: Self-pay | Admitting: Internal Medicine

## 2015-04-19 VITALS — BP 141/76 | HR 68 | Temp 97.9°F | Resp 12 | Ht 69.0 in | Wt 178.0 lb

## 2015-04-19 DIAGNOSIS — D122 Benign neoplasm of ascending colon: Secondary | ICD-10-CM | POA: Diagnosis not present

## 2015-04-19 DIAGNOSIS — K621 Rectal polyp: Secondary | ICD-10-CM

## 2015-04-19 DIAGNOSIS — D128 Benign neoplasm of rectum: Secondary | ICD-10-CM

## 2015-04-19 DIAGNOSIS — D125 Benign neoplasm of sigmoid colon: Secondary | ICD-10-CM

## 2015-04-19 DIAGNOSIS — K635 Polyp of colon: Secondary | ICD-10-CM

## 2015-04-19 DIAGNOSIS — Z86010 Personal history of colonic polyps: Secondary | ICD-10-CM | POA: Diagnosis present

## 2015-04-19 DIAGNOSIS — D124 Benign neoplasm of descending colon: Secondary | ICD-10-CM

## 2015-04-19 DIAGNOSIS — D129 Benign neoplasm of anus and anal canal: Secondary | ICD-10-CM

## 2015-04-19 DIAGNOSIS — D120 Benign neoplasm of cecum: Secondary | ICD-10-CM

## 2015-04-19 MED ORDER — SODIUM CHLORIDE 0.9 % IV SOLN: 500.0000 mL | INTRAVENOUS | Status: DC

## 2015-04-19 NOTE — Progress Notes (Signed)
Called to room to assist during endoscopic procedure.  Patient ID and intended procedure confirmed with present staff. Received instructions for my participation in the procedure from the performing physician.  

## 2015-04-19 NOTE — Op Note (Signed)
[place]  [address] [place], [id number]   COLONOSCOPY PROCEDURE REPORT  PATIENT: Carolyn Cordova, Carolyn Cordova  MR#: [id number] BIRTHDATE: 07-Nov-1961 , 89  yrs. old GENDER: female ENDOSCOPIST: Lafayette Dragon, MD REFERRED [id number] Vernell Morgans, M.D. PROCEDURE DATE:  04/19/2015 PROCEDURE:   Colonoscopy, screening, Colonoscopy with cold biopsy polypectomy, and Colonoscopy with snare polypectomy First Screening Colonoscopy - Avg.  risk and is 50 yrs.  old or older - No.  Prior Negative Screening - Now for repeat screening. N/A  History of Adenoma - Now for follow-up colonoscopy & has been > or = to 3 yrs.  N/A  Polyps removed today? Yes ASA CLASS:   Class I INDICATIONS:Screening for colonic neoplasia, Colorectal Neoplasm Risk Assessment for this procedure is average risk, and prior colonoscopy July 2006 hyperplastic polyp removed. MEDICATIONS: Monitored anesthesia care and Propofol 350 mg IV  DESCRIPTION OF PROCEDURE:   After the risks benefits and alternatives of the procedure were thoroughly explained, informed consent was obtained.  The digital rectal exam revealed no abnormalities of the rectum.   The LB [id number]  endoscope was introduced through the anus and advanced to the cecum, which was identified by both the appendix and ileocecal valve. No adverse events experienced.   The quality of the prep was good.  (MoviPrep was used)  The instrument was then slowly withdrawn as the colon was fully examined. Estimated blood loss is zero unless otherwise noted in this procedure report.      COLON FINDINGS: Ten sessile polyps measuring 3-5 mm x5 5-9 mm x 3, 10-13 mm x2 mm in size were found throughout the entire examined colon.  A polypectomy was performed with cold forceps x2,.  The resection was complete, the polyp tissue was completely retrieved and sent to histology.  A polypectomy was performed with a cold snare x8 .  The resection was complete, the polyp tissue  was completely retrieved and sent to histology.   There was mild diverticulosis noted in the sigmoid colon.  Retroflexed views revealed no abnormalities. The time to cecum = 7.11 Withdrawal time = 18.02   The scope was withdrawn and the procedure completed. COMPLICATIONS: There were no immediate complications.  ENDOSCOPIC IMPRESSION: 1.   Ten sessile polyps measuring 3-5 mm x5,  5-9 mm x 3,  10-13 mm x2 mm in size were found throughout the entire examined colon , polypd were removed with cold forecepts and with cold snare  x8 2.   Mild diverticulosis was noted in the sigmoid colon  RECOMMENDATIONS: 1.  Await pathology results 2.  No aspirin or inflammatory agents for 2 weeks High fiber diet Recall colonoscopy in 5 years depending on path report  eSigned:  Lafayette Dragon, MD 04/19/2015 10:58 AM   cc:   PATIENT NAME:  Carolyn, Cordova MR#: [id number]

## 2015-04-19 NOTE — Patient Instructions (Addendum)
YOU HAD AN ENDOSCOPIC PROCEDURE TODAY AT [facility]:   Refer to the procedure report that was given to you for any specific questions about what was found during the examination.  If the procedure report does not answer your questions, please call your gastroenterologist to clarify.  If you requested that your care partner not be given the details of your procedure findings, then the procedure report has been included in a sealed envelope for you to review at your convenience later.  YOU SHOULD EXPECT: Some feelings of bloating in the abdomen. Passage of more gas than usual.  Walking can help get rid of the air that was put into your GI tract during the procedure and reduce the bloating. If you had a lower endoscopy (such as a colonoscopy or flexible sigmoidoscopy) you may notice spotting of blood in your stool or on the toilet paper. If you underwent a bowel prep for your procedure, you may not have a normal bowel movement for a few days.  Please Note:  You might notice some irritation and congestion in your nose or some drainage.  This is from the oxygen used during your procedure.  There is no need for concern and it should clear up in a day or so.  SYMPTOMS TO REPORT IMMEDIATELY:   Following lower endoscopy (colonoscopy or flexible sigmoidoscopy):  Excessive amounts of blood in the stool  Significant tenderness or worsening of abdominal pains  Swelling of the abdomen that is new, acute  Fever of 100F or higher   For urgent or emergent issues, a gastroenterologist can be reached at any hour by calling [phone].   DIET: Your first meal following the procedure should be a small meal and then it is ok to progress to your normal diet. Heavy or fried foods are harder to digest and may make you feel nauseous or bloated.  Likewise, meals heavy in dairy and vegetables can increase bloating.  Drink plenty of fluids but you should avoid alcoholic beverages for 24 hours. Be sure  to eat a high fiber to help to prevent Diverticulitis.  You have Diverticulosis.  ACTIVITY:  You should plan to take it easy for the rest of today and you should NOT DRIVE or use heavy machinery until tomorrow (because of the sedation medicines used during the test).    FOLLOW UP: Our staff will call the number listed on your records the next business day following your procedure to check on you and address any questions or concerns that you may have regarding the information given to you following your procedure. If we do not reach you, we will leave a message.  However, if you are feeling well and you are not experiencing any problems, there is no need to return our call.  We will assume that you have returned to your regular daily activities without incident.  Be sure to read all of the handouts given to you by your recovery room nurse.  If any biopsies were taken you will be contacted by phone or by letter within the next 1-3 weeks.  Please call us at [phone] if you have not heard about the biopsies in 3 weeks. You had 10 polyps.    SIGNATURES/CONFIDENTIALITY: You and/or your care partner have signed paperwork which will be entered into your electronic medical record.  These signatures attest to the fact that that the information above on your After Visit Summary has been reviewed and is understood.  Full responsibility of  the confidentiality of this discharge information lies with you and/or your care-partner.  Do not use aspirin nor NSAIDS for two weeks after your procedure.  Since you had multiple polyps, Dr. Olevia Perches does not want the areas to bleed.

## 2015-04-19 NOTE — Progress Notes (Signed)
A/ox3 pleased with MAC, report to Suzanne RN 

## 2015-04-20 ENCOUNTER — Telehealth: Payer: Self-pay | Admitting: *Deleted

## 2015-04-20 NOTE — Telephone Encounter (Signed)
  Follow up Call-  Call back number 04/19/2015  Post procedure Call Back phone  # [phone]  Permission to leave phone message Yes     Patient questions:  Do you have a fever, pain , or abdominal swelling? No. Pain Score  0 *  Have you tolerated food without any problems? Yes.    Have you been able to return to your normal activities? Yes.    Do you have any questions about your discharge instructions: Diet   No. Medications  No. Follow up visit  No.  Do you have questions or concerns about your Care? Yes.   Pt. Just wondered how long until she received results of pathology.  Advised she should be receiving a letter within a couple of weeks.  To call Dr. Nichola Sizer office if she has not received report in 3 weeks. 'Actions: * If pain score is 4 or above: No action needed, pain <4.

## 2015-04-24 ENCOUNTER — Encounter: Payer: Self-pay | Admitting: Internal Medicine

## 2015-05-07 ENCOUNTER — Other Ambulatory Visit: Payer: Self-pay | Admitting: Family Medicine

## 2015-05-11 LAB — HM MAMMOGRAPHY: HM MAMMO: NORMAL

## 2015-05-14 ENCOUNTER — Encounter: Payer: Self-pay | Admitting: Family Medicine

## 2015-05-15 ENCOUNTER — Encounter: Payer: Self-pay | Admitting: Family Medicine

## 2015-05-15 ENCOUNTER — Encounter: Payer: Self-pay | Admitting: *Deleted

## 2015-10-29 ENCOUNTER — Telehealth: Payer: Self-pay

## 2015-10-29 DIAGNOSIS — E7800 Pure hypercholesterolemia, unspecified: Secondary | ICD-10-CM

## 2015-10-29 DIAGNOSIS — R768 Other specified abnormal immunological findings in serum: Secondary | ICD-10-CM

## 2015-10-29 DIAGNOSIS — R682 Dry mouth, unspecified: Secondary | ICD-10-CM

## 2015-10-29 DIAGNOSIS — R7689 Other specified abnormal immunological findings in serum: Secondary | ICD-10-CM

## 2015-10-29 MED ORDER — ATORVASTATIN CALCIUM 20 MG PO TABS: 20.0000 mg | ORAL_TABLET | Freq: Every day | ORAL | Status: DC

## 2015-10-29 NOTE — Telephone Encounter (Signed)
I sent it  Update if problems  Schedule fasting lab in 6 weeks for lipid/ast/alt  Thanks

## 2015-10-29 NOTE — Telephone Encounter (Signed)
I did future order for labs -done

## 2015-10-29 NOTE — Telephone Encounter (Signed)
Amy with [facility] left v/m; pt has not taken Crestor since June or July; Name brand Crestor was too expensive and generic Crestor pt could not tolerate for couple of reasons; atorvastatin is affordable and pt is willing to try atorvastatin. Amy request atorvastatin rx. Amy request cb.

## 2015-10-29 NOTE — Telephone Encounter (Signed)
Carolyn Cordova with [facility] notified Rx sent, pt also notified Rx sent and to update [place] if any problems Lab appt scheduled  Pt also wanted me to ask Dr. Glori Bickers a question. She hasn't been checked for any rheumatoid labs since 2013 and her ENT at the time told her to keep an eye on those labs because of her dry eye/dry mouth sxs. Pt would like to get these labs done when she comes in Feb for her lipid profile if Dr. Glori Bickers is okay with putting in the order

## 2015-10-30 ENCOUNTER — Other Ambulatory Visit: Payer: Self-pay | Admitting: Family Medicine

## 2015-10-30 NOTE — Telephone Encounter (Signed)
Pt notified lab orders done.

## 2015-12-19 ENCOUNTER — Other Ambulatory Visit (INDEPENDENT_AMBULATORY_CARE_PROVIDER_SITE_OTHER): Payer: BLUE CROSS/BLUE SHIELD

## 2015-12-19 DIAGNOSIS — E7800 Pure hypercholesterolemia, unspecified: Secondary | ICD-10-CM

## 2015-12-19 DIAGNOSIS — R682 Dry mouth, unspecified: Secondary | ICD-10-CM

## 2015-12-19 DIAGNOSIS — R768 Other specified abnormal immunological findings in serum: Secondary | ICD-10-CM

## 2015-12-19 LAB — LIPID PANEL
CHOL/HDL RATIO: 4
Cholesterol: 148 mg/dL (ref 0–200)
HDL: 36.2 mg/dL — ABNORMAL LOW (ref >39.00)
LDL Cholesterol: 92 mg/dL (ref 0–99)
NONHDL: 111.34
TRIGLYCERIDES: 95 mg/dL (ref 0.0–149.0)
VLDL: 19 mg/dL (ref 0.0–40.0)

## 2015-12-19 LAB — COMPREHENSIVE METABOLIC PANEL
ALT: 14 U/L (ref 0–35)
AST: 12 U/L (ref 0–37)
Albumin: 4.4 g/dL (ref 3.5–5.2)
Alkaline Phosphatase: 108 U/L (ref 39–117)
BILIRUBIN TOTAL: 0.5 mg/dL (ref 0.2–1.2)
BUN: 11 mg/dL (ref 6–23)
CALCIUM: 9.5 mg/dL (ref 8.4–10.5)
CO2: 27 meq/L (ref 19–32)
CREATININE: 0.7 mg/dL (ref 0.40–1.20)
Chloride: 106 mEq/L (ref 96–112)
GFR: 92.31 mL/min (ref >60.00)
GLUCOSE: 95 mg/dL (ref 70–99)
Potassium: 4.1 mEq/L (ref 3.5–5.1)
Sodium: 142 mEq/L (ref 135–145)
Total Protein: 6.9 g/dL (ref 6.0–8.3)

## 2015-12-19 LAB — RHEUMATOID FACTOR

## 2015-12-20 ENCOUNTER — Encounter: Payer: Self-pay | Admitting: *Deleted

## 2015-12-21 LAB — ANA: Anti Nuclear Antibody(ANA): NEGATIVE

## 2016-06-06 ENCOUNTER — Other Ambulatory Visit: Payer: Self-pay | Admitting: Family Medicine

## 2016-06-06 NOTE — Telephone Encounter (Signed)
Please schedule 30 min office visit late summer/early fall  Refill until then

## 2016-06-06 NOTE — Telephone Encounter (Signed)
Electronic refill request, no recent or future appts, please advise

## 2016-06-06 NOTE — Telephone Encounter (Signed)
appt scheduled and med refilled 

## 2016-07-30 ENCOUNTER — Encounter: Payer: Self-pay | Admitting: Family Medicine

## 2016-07-30 ENCOUNTER — Ambulatory Visit (INDEPENDENT_AMBULATORY_CARE_PROVIDER_SITE_OTHER): Payer: BLUE CROSS/BLUE SHIELD | Admitting: Family Medicine

## 2016-07-30 VITALS — BP 132/82 | HR 77 | Temp 98.3°F | Ht 69.0 in | Wt 171.0 lb

## 2016-07-30 DIAGNOSIS — I10 Essential (primary) hypertension: Secondary | ICD-10-CM | POA: Diagnosis not present

## 2016-07-30 DIAGNOSIS — F430 Acute stress reaction: Secondary | ICD-10-CM | POA: Insufficient documentation

## 2016-07-30 DIAGNOSIS — Z23 Encounter for immunization: Secondary | ICD-10-CM

## 2016-07-30 DIAGNOSIS — E7800 Pure hypercholesterolemia, unspecified: Secondary | ICD-10-CM | POA: Diagnosis not present

## 2016-07-30 DIAGNOSIS — L821 Other seborrheic keratosis: Secondary | ICD-10-CM | POA: Insufficient documentation

## 2016-07-30 DIAGNOSIS — F17200 Nicotine dependence, unspecified, uncomplicated: Secondary | ICD-10-CM

## 2016-07-30 MED ORDER — ATORVASTATIN CALCIUM 20 MG PO TABS: 20.0000 mg | ORAL_TABLET | Freq: Every day | ORAL | 3 refills | Status: DC

## 2016-07-30 MED ORDER — AMLODIPINE BESYLATE 5 MG PO TABS: 5.0000 mg | ORAL_TABLET | Freq: Every day | ORAL | 3 refills | Status: DC

## 2016-07-30 MED ORDER — CEVIMELINE HCL 30 MG PO CAPS: ORAL_CAPSULE | ORAL | 11 refills | Status: DC

## 2016-07-30 NOTE — Patient Instructions (Addendum)
Find out if your insurance will pay for shingles vaccine now or after age 56  Flu shot today  Exercise and fish or fish oil help bring up your good cholesterol (HDL)  Blood pressure is improved today  Take care of yourself  Think about writing in a journal daily  If you want to see a counselor please le [place] know  You are due for labs in February  Don't forget to schedule your annual mammogram since you had to cancel it  Keep thinking about quitting smoking

## 2016-07-30 NOTE — Progress Notes (Signed)
Pre visit review using our clinic review tool, if applicable. No additional management support is needed unless otherwise documented below in the visit note. 

## 2016-07-30 NOTE — Progress Notes (Signed)
Subjective:    Patient ID: Carolyn Cordova, female    DOB: 02-09-1961, 55 y.o.   MRN: [id number]  HPI  Here for f/u of chronic medical problems   Wt Readings from Last 3 Encounters:  07/30/16 171 lb (77.6 kg)  04/19/15 178 lb (80.7 kg)  04/11/15 178 lb 12.8 oz (81.1 kg)  has quit soft drinks - has helped her weight  bmi is 25.25  Lab on 12/19/2015  Component Date Value Ref Range Status  . Sodium 12/19/2015 142  135 - 145 mEq/L Final  . Potassium 12/19/2015 4.1  3.5 - 5.1 mEq/L Final  . Chloride 12/19/2015 106  96 - 112 mEq/L Final  . CO2 12/19/2015 27  19 - 32 mEq/L Final  . Glucose, Bld 12/19/2015 95  70 - 99 mg/dL Final  . BUN 12/19/2015 11  6 - 23 mg/dL Final  . Creatinine, Ser 12/19/2015 0.70  0.40 - 1.20 mg/dL Final  . Total Bilirubin 12/19/2015 0.5  0.2 - 1.2 mg/dL Final  . Alkaline Phosphatase 12/19/2015 108  39 - 117 U/L Final  . AST 12/19/2015 12  0 - 37 U/L Final  . ALT 12/19/2015 14  0 - 35 U/L Final  . Total Protein 12/19/2015 6.9  6.0 - 8.3 g/dL Final  . Albumin 12/19/2015 4.4  3.5 - 5.2 g/dL Final  . Calcium 12/19/2015 9.5  8.4 - 10.5 mg/dL Final  . GFR 12/19/2015 92.31  >60.00 mL/min Final  . Cholesterol 12/19/2015 148  0 - 200 mg/dL Final  . Triglycerides 12/19/2015 95.0  0.0 - 149.0 mg/dL Final  . HDL 12/19/2015 36.20* >39.00 mg/dL Final  . VLDL 12/19/2015 19.0  0.0 - 40.0 mg/dL Final  . LDL Cholesterol 12/19/2015 92  0 - 99 mg/dL Final  . Total CHOL/HDL Ratio 12/19/2015 4   Final  . NonHDL 12/19/2015 111.34   Final  . Anit Nuclear Antibody(ANA) 12/21/2015 NEG  NEGATIVE Final  . Rhuematoid fact SerPl-aCnc 12/19/2015 <10  <=14 IU/mL Final   Comment:                            Interpretive Table                     Low Positive: 15 - 41 IU/mL                     High Positive:  >= 42 IU/mL    In addition to the RF result, and clinical symptoms including joint  involvement, the 2010 ACR Classification Criteria for  scoring/diagnosing Rheumatoid  Arthritis include the results of the  following tests:  CRP (14970), ESR (15010), and CCP (APCA) (26378).  www.rheumatology.org/practice/clinical/classification/ra/ra_2010.asp      Flu vaccine - will get today   bp is stable today (improved from last time)  No cp or palpitations or headaches or edema  No side effects to medicines  BP Readings from Last 3 Encounters:  07/30/16 132/82  04/19/15 (!) 141/76  04/10/15 130/80   thinks she is  - handling stress better - still tired and not sleeping well  Some empty next syndrome  Takes amlodipine    Takes atorvastatin for cholesterol 20 mg  HDL is low at 36 She walks at least 2 miles per day/ very active job  Does not take fish oil Eats fish occ   Takes fair care of herself    Smoking status =  no change/ not ready to quit yet  Is thinking about it  Taking one step at a time   Does not tend to talk to anyone when she is stressed out  Declines counseling  Does not vent often-has a few friends  Wrote in a journal in the past   Has a spot on her leg - raised and brown  Not bothersome   Patient Active Problem List   Diagnosis Date Noted  . Stress reaction 07/30/2016  . Seborrheic keratosis 07/30/2016  . Colon cancer screening 04/10/2015  . Internal hemorrhoid 04/10/2015  . Encounter for routine gynecological examination 03/17/2014  . Routine general medical examination at a health care facility 03/09/2014  . Herpes zoster 08/09/2012  . Dry mouth 04/20/2012  . Elevated rheumatoid factor 04/20/2012  . Lumbar strain 10/04/2011  . Atrophic vaginitis 10/01/2011  . HERNIATED CERVICAL DISC 09/14/2009  . PURE HYPERCHOLESTEROLEMIA 05/10/2009  . TOBACCO ABUSE 08/19/2007  . CARPAL TUNNEL SYNDROME, BILATERAL 08/19/2007  . Essential hypertension 08/19/2007  . HEMORRHOIDS 08/19/2007  . History of cervical dysplasia 08/19/2007  . MIGRAINES, HX OF 08/19/2007   Past Medical History:  Diagnosis Date  . Allergy    seasonal  .  Carpal tunnel syndrome   . Cervical disc disease   . GERD (gastroesophageal reflux disease)   . Hypertension   . Migraine   . PMDD (premenstrual dysphoric disorder)    Past Surgical History:  Procedure Laterality Date  . CESAREAN SECTION    . CRYOABLATION  11/90   secondary to dysplasia  . lipoma removal  2006  . PILONIDAL CYST EXCISION  1980s   x2  . plantar fasciitis  8/12   surgery with Dr Milinda Pointer  . SPINE SURGERY  03/11   cervical spine surgery   Social History  Substance Use Topics  . Smoking status: Current Every Day Smoker    Packs/day: 1.00    Types: Cigarettes  . Smokeless tobacco: Never Used  . Alcohol use No   Family History  Problem Relation Age of Onset  . Hyperlipidemia Mother   . Cancer Mother     breast CA  . Cancer Father     adrenal cancer  . Colon polyps Father   . Hyperlipidemia Father   . Hyperlipidemia Brother   . Cancer Cousin     breast CA  . Colon cancer Neg Hx    Allergies  Allergen Reactions  . Adhesive [Tape]   . Amoxicillin-Pot Clavulanate     REACTION: rash  . Codeine     REACTION: ? reaction  . Fexofenadine     REACTION: nausea  . Sertraline Hcl     REACTION: sedation  . Varenicline Tartrate     REACTION: made her feel sick and did not work  . Wellbutrin [Bupropion] Other (See Comments)   Current Outpatient Prescriptions on File Prior to Visit  Medication Sig Dispense Refill  . aspirin 81 MG tablet Take 81 mg by mouth daily.       No current facility-administered medications on file prior to visit.     Review of Systems    Review of Systems  Constitutional: Negative for fever, appetite change, fatigue and unexpected weight change.  Eyes: Negative for pain and visual disturbance.  Respiratory: Negative for cough and shortness of breath.   Cardiovascular: Negative for cp or palpitations    Gastrointestinal: Negative for nausea, diarrhea and constipation.  Genitourinary: Negative for urgency and frequency.  Skin:  Negative for pallor or rash  Neurological: Negative for weakness, light-headedness, numbness and headaches.  Hematological: Negative for adenopathy. Does not bruise/bleed easily.  Psychiatric/Behavioral: pos for stressors and anxious mood at times/ neg for SI     Objective:   Physical Exam  Constitutional: She appears well-developed and well-nourished. No distress.  overwt and well appearing   HENT:  Head: Normocephalic and atraumatic.  Mouth/Throat: Oropharynx is clear and moist.  Eyes: Conjunctivae and EOM are normal. Pupils are equal, round, and reactive to light.  Neck: Normal range of motion. Neck supple. No JVD present. Carotid bruit is not present. No thyromegaly present.  Cardiovascular: Normal rate, regular rhythm, normal heart sounds and intact distal pulses.  Exam reveals no gallop.   Pulmonary/Chest: Effort normal and breath sounds normal. No respiratory distress. She has no wheezes. She has no rales.  No crackles  bs are slightly distant  Abdominal: Soft. Bowel sounds are normal. She exhibits no distension, no abdominal bruit and no mass. There is no tenderness.  Musculoskeletal: She exhibits no edema or tenderness.  Lymphadenopathy:    She has no cervical adenopathy.  Neurological: She is alert. She has normal reflexes.  Skin: Skin is warm and dry. No rash noted.  3-4 mm SK on L leg-brown in color with regular shape  Psychiatric: She has a normal mood and affect. Her speech is normal and behavior is normal. Thought content normal. Her mood appears not anxious. Her affect is not blunt, not labile and not inappropriate.  Pleasant and talkative Not tearful          Assessment & Plan:   Problem List Items Addressed This Visit      Cardiovascular and Mediastinum   Essential hypertension - Primary    bp in fair control at this time  BP Readings from Last 1 Encounters:  07/30/16 132/82   No changes needed Disc lifstyle change with low sodium diet and exercise    Labs reviewed from 2/17  Improved  Commended wt loss  Enc smoking cessation       Relevant Medications   amLODipine (NORVASC) 5 MG tablet   atorvastatin (LIPITOR) 20 MG tablet     Musculoskeletal and Integument   Seborrheic keratosis    Small SK on L leg- benign appearing Reassured  Will watch for change        Other   TOBACCO ABUSE    Disc in detail risks of smoking and possible outcomes including copd, vascular/ heart disease, cancer , respiratory and sinus infections  Pt voices understanding She is not ready to quit yet       Stress reaction    Reviewed stressors/ coping techniques/symptoms/ support sources/ tx options and side effects in detail today Pt is exp empty next syndrome and worries about her daughters - normal  She declines counseling- disc plan to start writing in a journal  Also exercise program plan  Update if symptoms worsen or do not improve       PURE HYPERCHOLESTEROLEMIA    Disc goals for lipids and reasons to control them Rev labs with pt-from 2/17 HDL is low  Disc exercise and fish/fish oil consumption to help raise this  Rev low sat fat diet in detail  Continue atorvastatin to keep LDL to goal      Relevant Medications   amLODipine (NORVASC) 5 MG tablet   atorvastatin (LIPITOR) 20 MG tablet    Other Visit Diagnoses    Need for influenza vaccination       Relevant Orders  Flu Vaccine QUAD 36+ mos IM (Completed)

## 2016-07-31 NOTE — Assessment & Plan Note (Signed)
Small SK on L leg- benign appearing Reassured  Will watch for change

## 2016-07-31 NOTE — Assessment & Plan Note (Signed)
bp in fair control at this time  BP Readings from Last 1 Encounters:  07/30/16 132/82   No changes needed Disc lifstyle change with low sodium diet and exercise  Labs reviewed from 2/17  Improved  Commended wt loss  Enc smoking cessation

## 2016-07-31 NOTE — Assessment & Plan Note (Signed)
Reviewed stressors/ coping techniques/symptoms/ support sources/ tx options and side effects in detail today Pt is exp empty next syndrome and worries about her daughters - normal  She declines counseling- disc plan to start writing in a journal  Also exercise program plan  Update if symptoms worsen or do not improve

## 2016-07-31 NOTE — Assessment & Plan Note (Signed)
Disc in detail risks of smoking and possible outcomes including copd, vascular/ heart disease, cancer , respiratory and sinus infections  Pt voices understanding She is not ready to quit yet  

## 2016-07-31 NOTE — Assessment & Plan Note (Signed)
Disc goals for lipids and reasons to control them Rev labs with pt-from 2/17 HDL is low  Disc exercise and fish/fish oil consumption to help raise this  Rev low sat fat diet in detail  Continue atorvastatin to keep LDL to goal

## 2017-06-13 ENCOUNTER — Other Ambulatory Visit: Payer: Self-pay | Admitting: Family Medicine

## 2017-06-16 NOTE — Telephone Encounter (Signed)
Last OV was 07/30/16, and no future appts., please advise

## 2017-06-16 NOTE — Telephone Encounter (Signed)
Please schedule a sept f/u and refill until then

## 2017-06-16 NOTE — Telephone Encounter (Signed)
Mr Rieger [facility] signed) left v/m requesting cb about amlodipine refill.

## 2017-06-16 NOTE — Telephone Encounter (Signed)
appt scheduled and med refill 

## 2017-08-05 ENCOUNTER — Ambulatory Visit (INDEPENDENT_AMBULATORY_CARE_PROVIDER_SITE_OTHER): Payer: BLUE CROSS/BLUE SHIELD | Admitting: Family Medicine

## 2017-08-05 ENCOUNTER — Encounter: Payer: Self-pay | Admitting: Family Medicine

## 2017-08-05 VITALS — BP 130/74 | HR 81 | Temp 98.0°F | Ht 68.0 in | Wt 165.5 lb

## 2017-08-05 DIAGNOSIS — I10 Essential (primary) hypertension: Secondary | ICD-10-CM

## 2017-08-05 DIAGNOSIS — E7800 Pure hypercholesterolemia, unspecified: Secondary | ICD-10-CM

## 2017-08-05 DIAGNOSIS — Z23 Encounter for immunization: Secondary | ICD-10-CM

## 2017-08-05 DIAGNOSIS — R682 Dry mouth, unspecified: Secondary | ICD-10-CM

## 2017-08-05 DIAGNOSIS — F17200 Nicotine dependence, unspecified, uncomplicated: Secondary | ICD-10-CM

## 2017-08-05 MED ORDER — ATORVASTATIN CALCIUM 20 MG PO TABS: 20.0000 mg | ORAL_TABLET | Freq: Every day | ORAL | 3 refills | Status: DC

## 2017-08-05 MED ORDER — AMLODIPINE BESYLATE 5 MG PO TABS: 5.0000 mg | ORAL_TABLET | Freq: Every day | ORAL | 3 refills | Status: DC

## 2017-08-05 MED ORDER — CEVIMELINE HCL 30 MG PO CAPS: ORAL_CAPSULE | ORAL | 11 refills | Status: DC

## 2017-08-05 NOTE — Patient Instructions (Addendum)
Find out from your insurance if generic crestor is covered    If not - get co enzyme Q-10 over the counter and take it as directed  Let me know if that does not help the leg pain   Keep thinking about quitting smoking   Keep up the good work with healthy diet and exercise   Flu shot today

## 2017-08-05 NOTE — Progress Notes (Signed)
Subjective:    Patient ID: Carolyn Cordova, female    DOB: 1961-04-29, 56 y.o.   MRN: [id number]  HPI Here for f/u of chronic health problems  Feeling good and doing well overall   Wt Readings from Last 3 Encounters:  08/05/17 165 lb 8 oz (75.1 kg)  07/30/16 171 lb (77.6 kg)  04/19/15 178 lb (80.7 kg)  works in a warehouse - walks a lot (most was 9 miles in a day) - good for her  Eating healthy - more chicken and lean meat and salads  25.16 kg/m  Smoking status - same/ about 1 ppd  She tried wellbutrin -and it caused depressive symptoms  Cigarettes are her stress coping mechanism     Flu vaccine -will get today   bp is stable today  No cp or palpitations or headaches or edema  No side effects to medicines  BP Readings from Last 3 Encounters:  08/05/17 130/74  07/30/16 132/82  04/19/15 (!) 141/76    Takes amlodipine     Hyperlipidemia Lab Results  Component Value Date   CHOL 148 12/19/2015   HDL 36.20 (L) 12/19/2015   LDLCALC 92 12/19/2015   LDLDIRECT 217.5 12/11/2010   TRIG 95.0 12/19/2015   CHOLHDL 4 12/19/2015   Takes atorvastatin 20 - makes her legs hurt  Worse with walking and exercise    takex evoxac for chronic dry mouth  Needs refill   Patient Active Problem List   Diagnosis Date Noted  . Stress reaction 07/30/2016  . Seborrheic keratosis 07/30/2016  . Colon cancer screening 04/10/2015  . Internal hemorrhoid 04/10/2015  . Encounter for routine gynecological examination 03/17/2014  . Routine general medical examination at a health care facility 03/09/2014  . Herpes zoster 08/09/2012  . Dry mouth 04/20/2012  . Elevated rheumatoid factor 04/20/2012  . Lumbar strain 10/04/2011  . Atrophic vaginitis 10/01/2011  . HERNIATED CERVICAL DISC 09/14/2009  . PURE HYPERCHOLESTEROLEMIA 05/10/2009  . TOBACCO ABUSE 08/19/2007  . CARPAL TUNNEL SYNDROME, BILATERAL 08/19/2007  . Essential hypertension 08/19/2007  . HEMORRHOIDS 08/19/2007  . History of  cervical dysplasia 08/19/2007  . MIGRAINES, HX OF 08/19/2007   Past Medical History:  Diagnosis Date  . Allergy    seasonal  . Carpal tunnel syndrome   . Cervical disc disease   . GERD (gastroesophageal reflux disease)   . Hypertension   . Migraine   . PMDD (premenstrual dysphoric disorder)    Past Surgical History:  Procedure Laterality Date  . CESAREAN SECTION    . CRYOABLATION  11/90   secondary to dysplasia  . lipoma removal  2006  . PILONIDAL CYST EXCISION  1980s   x2  . plantar fasciitis  8/12   surgery with Dr Milinda Pointer  . SPINE SURGERY  03/11   cervical spine surgery   Social History  Substance Use Topics  . Smoking status: Current Every Day Smoker    Packs/day: 1.00    Types: Cigarettes  . Smokeless tobacco: Never Used  . Alcohol use No   Family History  Problem Relation Age of Onset  . Hyperlipidemia Mother   . Cancer Mother        breast CA  . Cancer Father        adrenal cancer  . Colon polyps Father   . Hyperlipidemia Father   . Hyperlipidemia Brother   . Cancer Cousin        breast CA  . Colon cancer Neg Hx    Allergies  Allergen Reactions  . Adhesive [Tape]   . Amoxicillin-Pot Clavulanate     REACTION: rash  . Codeine     REACTION: ? reaction  . Fexofenadine     REACTION: nausea  . Sertraline Hcl     REACTION: sedation  . Varenicline Tartrate     REACTION: made her feel sick and did not work  . Wellbutrin [Bupropion] Other (See Comments)   Current Outpatient Prescriptions on File Prior to Visit  Medication Sig Dispense Refill  . aspirin 81 MG tablet Take 81 mg by mouth daily.      . cetirizine (ZYRTEC) 10 MG tablet Take 10 mg by mouth daily.     No current facility-administered medications on file prior to visit.     Review of Systems  Constitutional: Negative for activity change, appetite change, fatigue, fever and unexpected weight change.  HENT: Negative for congestion, ear pain, rhinorrhea, sinus pressure and sore throat.     Eyes: Negative for pain, redness and visual disturbance.  Respiratory: Negative for cough, shortness of breath and wheezing.   Cardiovascular: Negative for chest pain and palpitations.  Gastrointestinal: Negative for abdominal pain, blood in stool, constipation and diarrhea.  Endocrine: Negative for polydipsia and polyuria.  Genitourinary: Negative for dysuria, frequency and urgency.  Musculoskeletal: Positive for myalgias. Negative for arthralgias and back pain.  Skin: Negative for pallor and rash.  Allergic/Immunologic: Negative for environmental allergies.  Neurological: Negative for dizziness, syncope and headaches.  Hematological: Negative for adenopathy. Does not bruise/bleed easily.  Psychiatric/Behavioral: Negative for decreased concentration and dysphoric mood. The patient is nervous/anxious.        Pos for stressors        Objective:   Physical Exam  Constitutional: She appears well-developed and well-nourished. No distress.  Well appearing  Wt loss noted   HENT:  Head: Normocephalic and atraumatic.  Mouth/Throat: Oropharynx is clear and moist.  Eyes: Pupils are equal, round, and reactive to light. Conjunctivae and EOM are normal.  Neck: Normal range of motion. Neck supple. No JVD present. Carotid bruit is not present. No thyromegaly present.  Cardiovascular: Normal rate, regular rhythm, normal heart sounds and intact distal pulses.  Exam reveals no gallop.   Pulmonary/Chest: Effort normal and breath sounds normal. No respiratory distress. She has no wheezes. She has no rales.  No crackles  bs are mildly distant  Abdominal: Soft. Bowel sounds are normal. She exhibits no distension, no abdominal bruit and no mass. There is no tenderness.  Musculoskeletal: She exhibits no edema.  Lymphadenopathy:    She has no cervical adenopathy.  Neurological: She is alert. She has normal reflexes.  Skin: Skin is warm and dry. No rash noted. No pallor.  Psychiatric: She has a normal  mood and affect.          Assessment & Plan:   Problem List Items Addressed This Visit      Cardiovascular and Mediastinum   Essential hypertension - Primary    bp in fair control at this time  BP Readings from Last 1 Encounters:  08/05/17 130/74   No changes needed Disc lifstyle change with low sodium diet and exercise  Labs ordered Continue amlodipine  Enc to quit smoking       Relevant Medications   atorvastatin (LIPITOR) 20 MG tablet   amLODipine (NORVASC) 5 MG tablet   Other Relevant Orders   CBC with Differential/Platelet   Comprehensive metabolic panel   Lipid panel   TSH  Digestive   Dry mouth    Refilled Evoxac which helps         Other   PURE HYPERCHOLESTEROLEMIA    Disc goals for lipids and reasons to control them Rev labs with pt 9last lab)  Rev low sat fat diet in detail Lab today Having leg pain from atorvastatin  Disc opt of change to crestor or addn of co enz Q 10  She will consider this       Relevant Medications   atorvastatin (LIPITOR) 20 MG tablet   amLODipine (NORVASC) 5 MG tablet   Other Relevant Orders   Lipid panel   TOBACCO ABUSE    Disc in detail risks of smoking and possible outcomes including copd, vascular/ heart disease, cancer , respiratory and sinus infections  Pt voices understanding  Not ready to quit due to stressors unfortunately In the past welbutrin makde her feel depressed       Other Visit Diagnoses    Need for influenza vaccination       Relevant Orders   Flu Vaccine QUAD 36+ mos IM (Completed)

## 2017-08-06 LAB — LIPID PANEL
CHOL/HDL RATIO: 5
Cholesterol: 240 mg/dL — ABNORMAL HIGH (ref 0–200)
HDL: 44.2 mg/dL (ref >39.00)
NONHDL: 195.36
Triglycerides: 317 mg/dL — ABNORMAL HIGH (ref 0.0–149.0)
VLDL: 63.4 mg/dL — AB (ref 0.0–40.0)

## 2017-08-06 LAB — CBC WITH DIFFERENTIAL/PLATELET
BASOS ABS: 0.1 10*3/uL (ref 0.0–0.1)
Basophils Relative: 1.9 % (ref 0.0–3.0)
EOS PCT: 5.1 % — AB (ref 0.0–5.0)
Eosinophils Absolute: 0.4 10*3/uL (ref 0.0–0.7)
HCT: 44.2 % (ref 36.0–46.0)
Hemoglobin: 15 g/dL (ref 12.0–15.0)
LYMPHS ABS: 2.9 10*3/uL (ref 0.7–4.0)
Lymphocytes Relative: 40.1 % (ref 12.0–46.0)
MCHC: 33.9 g/dL (ref 30.0–36.0)
MCV: 92.9 fl (ref 78.0–100.0)
MONO ABS: 0.5 10*3/uL (ref 0.1–1.0)
Monocytes Relative: 7.1 % (ref 3.0–12.0)
NEUTROS ABS: 3.3 10*3/uL (ref 1.4–7.7)
NEUTROS PCT: 45.8 % (ref 43.0–77.0)
PLATELETS: 242 10*3/uL (ref 150.0–400.0)
RBC: 4.76 Mil/uL (ref 3.87–5.11)
RDW: 13 % (ref 11.5–15.5)
WBC: 7.2 10*3/uL (ref 4.0–10.5)

## 2017-08-06 LAB — TSH: TSH: 1.14 u[IU]/mL (ref 0.35–4.50)

## 2017-08-06 LAB — COMPREHENSIVE METABOLIC PANEL
ALT: 10 U/L (ref 0–35)
AST: 11 U/L (ref 0–37)
Albumin: 4.4 g/dL (ref 3.5–5.2)
Alkaline Phosphatase: 76 U/L (ref 39–117)
BUN: 15 mg/dL (ref 6–23)
CHLORIDE: 105 meq/L (ref 96–112)
CO2: 27 meq/L (ref 19–32)
Calcium: 9.7 mg/dL (ref 8.4–10.5)
Creatinine, Ser: 0.74 mg/dL (ref 0.40–1.20)
GFR: 86.06 mL/min (ref >60.00)
GLUCOSE: 81 mg/dL (ref 70–99)
POTASSIUM: 3.9 meq/L (ref 3.5–5.1)
SODIUM: 139 meq/L (ref 135–145)
Total Bilirubin: 0.3 mg/dL (ref 0.2–1.2)
Total Protein: 6.6 g/dL (ref 6.0–8.3)

## 2017-08-06 LAB — LDL CHOLESTEROL, DIRECT: LDL DIRECT: 150 mg/dL

## 2017-08-06 NOTE — Assessment & Plan Note (Signed)
Disc in detail risks of smoking and possible outcomes including copd, vascular/ heart disease, cancer , respiratory and sinus infections  Pt voices understanding  Not ready to quit due to stressors unfortunately In the past welbutrin makde her feel depressed

## 2017-08-06 NOTE — Assessment & Plan Note (Signed)
Disc goals for lipids and reasons to control them Rev labs with pt 9last lab)  Rev low sat fat diet in detail Lab today Having leg pain from atorvastatin  Disc opt of change to crestor or addn of co enz Q 10  She will consider this

## 2017-08-06 NOTE — Assessment & Plan Note (Signed)
bp in fair control at this time  BP Readings from Last 1 Encounters:  08/05/17 130/74   No changes needed Disc lifstyle change with low sodium diet and exercise  Labs ordered Continue amlodipine  Enc to quit smoking

## 2017-08-06 NOTE — Assessment & Plan Note (Signed)
Refilled Evoxac which helps

## 2017-09-02 ENCOUNTER — Other Ambulatory Visit (HOSPITAL_COMMUNITY)
Admission: RE | Admit: 2017-09-02 | Discharge: 2017-09-02 | Disposition: A | Discharge status: Home / Self Care | Payer: BLUE CROSS/BLUE SHIELD | Source: Ambulatory Visit | Attending: Family Medicine | Admitting: Family Medicine

## 2017-09-02 ENCOUNTER — Encounter: Payer: Self-pay | Admitting: Family Medicine

## 2017-09-02 ENCOUNTER — Ambulatory Visit (INDEPENDENT_AMBULATORY_CARE_PROVIDER_SITE_OTHER): Payer: BLUE CROSS/BLUE SHIELD | Admitting: Family Medicine

## 2017-09-02 VITALS — BP 112/70 | HR 82 | Temp 98.0°F | Ht 68.5 in | Wt 166.0 lb

## 2017-09-02 DIAGNOSIS — F17200 Nicotine dependence, unspecified, uncomplicated: Secondary | ICD-10-CM | POA: Diagnosis not present

## 2017-09-02 DIAGNOSIS — Z1159 Encounter for screening for other viral diseases: Secondary | ICD-10-CM | POA: Diagnosis not present

## 2017-09-02 DIAGNOSIS — Z1211 Encounter for screening for malignant neoplasm of colon: Secondary | ICD-10-CM | POA: Diagnosis not present

## 2017-09-02 DIAGNOSIS — Z1231 Encounter for screening mammogram for malignant neoplasm of breast: Secondary | ICD-10-CM | POA: Diagnosis not present

## 2017-09-02 DIAGNOSIS — Z0000 Encounter for general adult medical examination without abnormal findings: Secondary | ICD-10-CM

## 2017-09-02 DIAGNOSIS — Z114 Encounter for screening for human immunodeficiency virus [HIV]: Secondary | ICD-10-CM | POA: Diagnosis not present

## 2017-09-02 DIAGNOSIS — Z01419 Encounter for gynecological examination (general) (routine) without abnormal findings: Secondary | ICD-10-CM

## 2017-09-02 DIAGNOSIS — I10 Essential (primary) hypertension: Secondary | ICD-10-CM | POA: Diagnosis not present

## 2017-09-02 DIAGNOSIS — E7800 Pure hypercholesterolemia, unspecified: Secondary | ICD-10-CM

## 2017-09-02 MED ORDER — ROSUVASTATIN CALCIUM 10 MG PO TABS: 10.0000 mg | ORAL_TABLET | Freq: Every day | ORAL | 11 refills | Status: DC

## 2017-09-02 NOTE — Patient Instructions (Addendum)
We will refer for mammogram  You are due for a colonoscopy 6/19   Labs for Hep C and HIV screening today   Pap and gyn exam today   Keep thinking about quitting smoking   Eat a healthy balanced diet and exercise regularly

## 2017-09-02 NOTE — Progress Notes (Signed)
Subjective:    Patient ID: Carolyn Cordova, female    DOB: Dec 11, 1960, 56 y.o.   MRN: [id number]  HPI Here for health maintenance exam and to review chronic medical problems    Feeling ok overall   Wt Readings from Last 3 Encounters:  09/02/17 166 lb (75.3 kg)  08/05/17 165 lb 8 oz (75.1 kg)  07/30/16 171 lb (77.6 kg)  keeping weight off  More exercise- lots of walking - at work and out of work  Measured 9 miles of walking at work one day! 24.87 kg/m  Hep C/ HIV screening -will do today    Mammogram 6/16 nl - goes to [facility] / needs ref  Self breast exam (no lumps)  Mother and cousin with breast cancer   Pap 5/15 neg with neg hpv Due for 3 year pap   Colonoscopy 6/16 adenoma  3 year recall   Flu shot 9/18  Tdap 8/14  Smoking status -1 ppd  Not ready to quit yet - it is difficult psychologically   bp is stable today  No cp or palpitations or headaches or edema  No side effects to medicines  BP Readings from Last 3 Encounters:  09/02/17 112/70  08/05/17 130/74  07/30/16 132/82      Hyperlipidemia  Lab Results  Component Value Date   CHOL 240 (H) 08/05/2017   CHOL 148 12/19/2015   CHOL 175 04/10/2015   Lab Results  Component Value Date   HDL 44.20 08/05/2017   HDL 36.20 (L) 12/19/2015   HDL 41.70 04/10/2015   Lab Results  Component Value Date   LDLCALC 92 12/19/2015   LDLCALC 113 (H) 04/10/2015   LDLCALC 73 03/10/2014   Lab Results  Component Value Date   TRIG 317.0 (H) 08/05/2017   TRIG 95.0 12/19/2015   TRIG 101.0 04/10/2015   Lab Results  Component Value Date   CHOLHDL 5 08/05/2017   CHOLHDL 4 12/19/2015   CHOLHDL 4 04/10/2015   Lab Results  Component Value Date   LDLDIRECT 150.0 08/05/2017   LDLDIRECT 217.5 12/11/2010   LDLDIRECT 185.0 05/10/2009  had discussed change to crestor  Would like to take a px for them to check cost  lipitor hurts legs  Both parents have high cholesterol  Diet is fairly good - lot of low fat  protein/chicken and salad   Labs: Results for orders placed or performed in visit on 08/05/17  CBC with Differential/Platelet  Result Value Ref Range   WBC 7.2 4.0 - 10.5 K/uL   RBC 4.76 3.87 - 5.11 Mil/uL   Hemoglobin 15.0 12.0 - 15.0 g/dL   HCT 44.2 36.0 - 46.0 %   MCV 92.9 78.0 - 100.0 fl   MCHC 33.9 30.0 - 36.0 g/dL   RDW 13.0 11.5 - 15.5 %   Platelets 242.0 150.0 - 400.0 K/uL   Neutrophils Relative % 45.8 43.0 - 77.0 %   Lymphocytes Relative 40.1 12.0 - 46.0 %   Monocytes Relative 7.1 3.0 - 12.0 %   Eosinophils Relative 5.1 (H) 0.0 - 5.0 %   Basophils Relative 1.9 0.0 - 3.0 %   Neutro Abs 3.3 1.4 - 7.7 K/uL   Lymphs Abs 2.9 0.7 - 4.0 K/uL   Monocytes Absolute 0.5 0.1 - 1.0 K/uL   Eosinophils Absolute 0.4 0.0 - 0.7 K/uL   Basophils Absolute 0.1 0.0 - 0.1 K/uL  Comprehensive metabolic panel  Result Value Ref Range   Sodium 139 135 - 145 mEq/L  Potassium 3.9 3.5 - 5.1 mEq/L   Chloride 105 96 - 112 mEq/L   CO2 27 19 - 32 mEq/L   Glucose, Bld 81 70 - 99 mg/dL   BUN 15 6 - 23 mg/dL   Creatinine, Ser 0.74 0.40 - 1.20 mg/dL   Total Bilirubin 0.3 0.2 - 1.2 mg/dL   Alkaline Phosphatase 76 39 - 117 U/L   AST 11 0 - 37 U/L   ALT 10 0 - 35 U/L   Total Protein 6.6 6.0 - 8.3 g/dL   Albumin 4.4 3.5 - 5.2 g/dL   Calcium 9.7 8.4 - 10.5 mg/dL   GFR 86.06 >60.00 mL/min  Lipid panel  Result Value Ref Range   Cholesterol 240 (H) 0 - 200 mg/dL   Triglycerides 317.0 (H) 0.0 - 149.0 mg/dL   HDL 44.20 >39.00 mg/dL   VLDL 63.4 (H) 0.0 - 40.0 mg/dL   Total CHOL/HDL Ratio 5    NonHDL 195.36   TSH  Result Value Ref Range   TSH 1.14 0.35 - 4.50 uIU/mL  LDL cholesterol, direct  Result Value Ref Range   Direct LDL 150.0 mg/dL     Patient Active Problem List   Diagnosis Date Noted  . Encounter for hepatitis C screening test for low risk patient 09/02/2017  . Encounter for screening for HIV 09/02/2017  . Screening mammogram, encounter for 09/02/2017  . Stress reaction 07/30/2016  .  Colon cancer screening 04/10/2015  . Internal hemorrhoid 04/10/2015  . Encounter for routine gynecological examination 03/17/2014  . Routine general medical examination at a health care facility 03/09/2014  . H/O herpes zoster 08/09/2012  . Dry mouth 04/20/2012  . Elevated rheumatoid factor 04/20/2012  . Atrophic vaginitis 10/01/2011  . HERNIATED CERVICAL DISC 09/14/2009  . PURE HYPERCHOLESTEROLEMIA 05/10/2009  . TOBACCO ABUSE 08/19/2007  . CARPAL TUNNEL SYNDROME, BILATERAL 08/19/2007  . Essential hypertension 08/19/2007  . HEMORRHOIDS 08/19/2007  . History of cervical dysplasia 08/19/2007  . MIGRAINES, HX OF 08/19/2007   Past Medical History:  Diagnosis Date  . Allergy    seasonal  . Carpal tunnel syndrome   . Cervical disc disease   . GERD (gastroesophageal reflux disease)   . Hypertension   . Migraine   . PMDD (premenstrual dysphoric disorder)    Past Surgical History:  Procedure Laterality Date  . CESAREAN SECTION    . CRYOABLATION  11/90   secondary to dysplasia  . lipoma removal  2006  . PILONIDAL CYST EXCISION  1980s   x2  . plantar fasciitis  8/12   surgery with Dr Milinda Pointer  . SPINE SURGERY  03/11   cervical spine surgery   Social History  Substance Use Topics  . Smoking status: Current Every Day Smoker    Packs/day: 1.00    Types: Cigarettes  . Smokeless tobacco: Never Used  . Alcohol use No   Family History  Problem Relation Age of Onset  . Hyperlipidemia Mother   . Cancer Mother        breast CA  . Cancer Father        adrenal cancer  . Colon polyps Father   . Hyperlipidemia Father   . Hyperlipidemia Brother   . Cancer Cousin        breast CA  . Colon cancer Neg Hx    Allergies  Allergen Reactions  . Adhesive [Tape]   . Amoxicillin-Pot Clavulanate     REACTION: rash  . Codeine     REACTION: ? reaction  .  Fexofenadine     REACTION: nausea  . Sertraline Hcl     REACTION: sedation  . Varenicline Tartrate     REACTION: made her feel sick  and did not work  . Wellbutrin [Bupropion] Other (See Comments)   Current Outpatient Prescriptions on File Prior to Visit  Medication Sig Dispense Refill  . amLODipine (NORVASC) 5 MG tablet Take 1 tablet (5 mg total) by mouth daily. 90 tablet 3  . aspirin 81 MG tablet Take 81 mg by mouth daily.      Marland Kitchen atorvastatin (LIPITOR) 20 MG tablet Take 1 tablet (20 mg total) by mouth daily. (Patient taking differently: Take 20 mg by mouth daily. Per patient takes 1 to 2 times per week) 90 tablet 3  . cetirizine (ZYRTEC) 10 MG tablet Take 10 mg by mouth daily.    . cevimeline (EVOXAC) 30 MG capsule TAKE 1 CAPSULE BY MOUTH THREE TIMES A DAY 90 capsule 11   No current facility-administered medications on file prior to visit.     Review of Systems  Constitutional: Negative for activity change, appetite change, fatigue, fever and unexpected weight change.  HENT: Negative for congestion, ear pain, rhinorrhea, sinus pressure and sore throat.   Eyes: Negative for pain, redness and visual disturbance.  Respiratory: Negative for cough, shortness of breath and wheezing.   Cardiovascular: Negative for chest pain and palpitations.  Gastrointestinal: Negative for abdominal pain, blood in stool, constipation and diarrhea.  Endocrine: Negative for polydipsia and polyuria.  Genitourinary: Negative for dysuria, frequency and urgency.  Musculoskeletal: Negative for arthralgias, back pain and myalgias.  Skin: Negative for pallor and rash.  Allergic/Immunologic: Negative for environmental allergies.  Neurological: Negative for dizziness, syncope and headaches.  Hematological: Negative for adenopathy. Does not bruise/bleed easily.  Psychiatric/Behavioral: Negative for decreased concentration and dysphoric mood. The patient is not nervous/anxious.        Pos for stressors        Objective:   Physical Exam  Constitutional: She appears well-developed and well-nourished. No distress.  HENT:  Head: Normocephalic and  atraumatic.  Right Ear: External ear normal.  Left Ear: External ear normal.  Mouth/Throat: Oropharynx is clear and moist.  Eyes: Pupils are equal, round, and reactive to light. Conjunctivae and EOM are normal. No scleral icterus.  Neck: Normal range of motion. Neck supple. No JVD present. Carotid bruit is not present. No thyromegaly present.  Cardiovascular: Normal rate, regular rhythm, normal heart sounds and intact distal pulses.  Exam reveals no gallop.   Pulmonary/Chest: Effort normal and breath sounds normal. No respiratory distress. She has no wheezes. She exhibits no tenderness.  Good air exch  No wheeze   Abdominal: Soft. Bowel sounds are normal. She exhibits no distension, no abdominal bruit and no mass. There is no tenderness.  Genitourinary: No breast swelling, tenderness, discharge or bleeding.  Genitourinary Comments: Breast exam: No mass, nodules, thickening, tenderness, bulging, retraction, inflamation, nipple discharge or skin changes noted.  No axillary or clavicular LA.             Anus appears normal w/o hemorrhoids or masses     External genitalia : nl appearance and hair distribution/no lesions     Urethral meatus : nl size, no lesions or prolapse     Urethra: no masses, tenderness or scarring    Bladder : no masses or tenderness     Vagina: nl general appearance, no discharge or  Lesions, no significant cystocele  or rectocele  Cervix: no lesions/ discharge or friability    Uterus: nl size, contour, position, and mobility (not fixed) , non tender    Adnexa : no masses, tenderness, enlargement or nodularity        Musculoskeletal: Normal range of motion. She exhibits no edema or tenderness.  Lymphadenopathy:    She has no cervical adenopathy.  Neurological: She is alert. She has normal reflexes. No cranial nerve deficit. She exhibits normal muscle tone. Coordination normal.  Skin: Skin is warm and dry. No rash noted. No erythema. No  pallor.  Solar lentigines diffusely Some SKs  Psychiatric: She has a normal mood and affect.          Assessment & Plan:   Problem List Items Addressed This Visit      Cardiovascular and Mediastinum   Essential hypertension - Primary    bp in fair control at this time  BP Readings from Last 1 Encounters:  09/02/17 112/70   No changes needed Disc lifstyle change with low sodium diet and exercise  Labs reviewed       Relevant Medications   rosuvastatin (CRESTOR) 10 MG tablet     Other   Colon cancer screening    Due for colonoscopy in 6/19 Pt aware  Hx of adenomatous polyps       Encounter for hepatitis C screening test for low risk patient    Screening today      Relevant Orders   Hepatitis C antibody (Completed)   Encounter for routine gynecological examination    Routine exam with pap  No c/o  Post menopausal      Relevant Orders   Cytology - PAP   Encounter for screening for HIV    Screening today      Relevant Orders   HIV antibody (Completed)   PURE HYPERCHOLESTEROLEMIA    Disc goals for lipids and reasons to control them Rev labs with pt Rev low sat fat diet in detail Experiencing muscular side eff from lipitor  Would like to try crestor if affordable Px printed so she can check price  Will call and let us know what she ends up doing      Relevant Medications   rosuvastatin (CRESTOR) 10 MG tablet   Routine general medical examination at a health care facility    Reviewed health habits including diet and exercise and skin cancer prevention Reviewed appropriate screening tests for age  Also reviewed health mt list, fam hx and immunization status , as well as social and family history   See HPI Labs rev Mammogram ref done  Hep C and HIV screening  Smoking cessation enc  Pap and gyn exam today  Due for screen colonoscopy in June 2019       Screening mammogram, encounter for    Scheduled annual screening mammogram Nl breast exam today   Encouraged monthly self exams        Relevant Orders   MM DIGITAL SCREENING BILATERAL   TOBACCO ABUSE    Disc in detail risks of smoking and possible outcomes including copd, vascular/ heart disease, cancer , respiratory and sinus infections  Pt voices understanding

## 2017-09-03 LAB — HIV ANTIBODY (ROUTINE TESTING W REFLEX): HIV 1&2 Ab, 4th Generation: NONREACTIVE

## 2017-09-03 LAB — HEPATITIS C ANTIBODY
Hepatitis C Ab: NONREACTIVE
SIGNAL TO CUT-OFF: 0.01 (ref <1.00)

## 2017-09-03 NOTE — Assessment & Plan Note (Signed)
Routine exam with pap  No c/o  Post menopausal

## 2017-09-03 NOTE — Assessment & Plan Note (Signed)
Reviewed health habits including diet and exercise and skin cancer prevention Reviewed appropriate screening tests for age  Also reviewed health mt list, fam hx and immunization status , as well as social and family history   See HPI Labs rev Mammogram ref done  Hep C and HIV screening  Smoking cessation enc  Pap and gyn exam today  Due for screen colonoscopy in June 2019

## 2017-09-03 NOTE — Assessment & Plan Note (Signed)
Disc in detail risks of smoking and possible outcomes including copd, vascular/ heart disease, cancer , respiratory and sinus infections  Pt voices understanding  

## 2017-09-03 NOTE — Assessment & Plan Note (Signed)
Screening today 

## 2017-09-03 NOTE — Assessment & Plan Note (Signed)
Disc goals for lipids and reasons to control them Rev labs with pt Rev low sat fat diet in detail Experiencing muscular side eff from lipitor  Would like to try crestor if affordable Px printed so she can check price  Will call and let us know what she ends up doing

## 2017-09-03 NOTE — Assessment & Plan Note (Signed)
Due for colonoscopy in 6/19 Pt aware  Hx of adenomatous polyps

## 2017-09-03 NOTE — Assessment & Plan Note (Signed)
bp in fair control at this time  BP Readings from Last 1 Encounters:  09/02/17 112/70   No changes needed Disc lifstyle change with low sodium diet and exercise  Labs reviewed

## 2017-09-03 NOTE — Assessment & Plan Note (Signed)
Scheduled annual screening mammogram Nl breast exam today  Encouraged monthly self exams   

## 2017-09-08 LAB — CYTOLOGY - PAP
Diagnosis: NEGATIVE
HPV (WINDOPATH): NOT DETECTED

## 2017-11-25 ENCOUNTER — Ambulatory Visit (INDEPENDENT_AMBULATORY_CARE_PROVIDER_SITE_OTHER): Payer: BLUE CROSS/BLUE SHIELD | Admitting: Family Medicine

## 2017-11-25 ENCOUNTER — Ambulatory Visit (INDEPENDENT_AMBULATORY_CARE_PROVIDER_SITE_OTHER)
Admission: RE | Admit: 2017-11-25 | Discharge: 2017-11-25 | Disposition: A | Discharge status: Home / Self Care | Payer: BLUE CROSS/BLUE SHIELD | Source: Ambulatory Visit | Attending: Family Medicine | Admitting: Family Medicine

## 2017-11-25 ENCOUNTER — Encounter: Payer: Self-pay | Admitting: Family Medicine

## 2017-11-25 VITALS — BP 124/82 | HR 80 | Temp 98.0°F | Wt 162.0 lb

## 2017-11-25 DIAGNOSIS — R3129 Other microscopic hematuria: Secondary | ICD-10-CM | POA: Diagnosis not present

## 2017-11-25 DIAGNOSIS — R1032 Left lower quadrant pain: Secondary | ICD-10-CM

## 2017-11-25 DIAGNOSIS — M545 Low back pain: Secondary | ICD-10-CM

## 2017-11-25 DIAGNOSIS — R319 Hematuria, unspecified: Secondary | ICD-10-CM | POA: Diagnosis not present

## 2017-11-25 LAB — POCT URINALYSIS DIPSTICK
BILIRUBIN UA: NEGATIVE
GLUCOSE UA: NEGATIVE
KETONES UA: NEGATIVE
Leukocytes, UA: NEGATIVE
Nitrite, UA: NEGATIVE
PH UA: 6 (ref 5.0–8.0)
Protein, UA: 15
Spec Grav, UA: >=1.03 — AB (ref 1.010–1.025)
UROBILINOGEN UA: 0.2 U/dL

## 2017-11-25 MED ORDER — TAMSULOSIN HCL 0.4 MG PO CAPS: 0.4000 mg | ORAL_CAPSULE | Freq: Every day | ORAL | 3 refills | Status: DC

## 2017-11-25 MED ORDER — KETOROLAC TROMETHAMINE 60 MG/2ML IM SOLN
60.0000 mg | Freq: Once | INTRAMUSCULAR | Status: AC
  Administered 2017-11-25: 60 mg via INTRAMUSCULAR

## 2017-11-25 NOTE — Patient Instructions (Signed)
You may have a kidney stone that did not show up on xray. I have sent a medication called flomax to you pharmacy to help with passing urine. If your symptoms resolve, you can stop it.  Please follow up if not better in next 48 hours- will order CT scan of abdomen  Follow up with Dr. Glori Bickers in 4-6 weeks for blood in your urine

## 2017-11-25 NOTE — Progress Notes (Signed)
Subjective:    Patient ID: Carolyn Cordova, female    DOB: [birth year], 57 y.o.   MRN: [id number]  HPI This is a 57 yo female who woke up this morning at 2 am with left sided abdominal pain. Has some lower abdominal pressure. No dysuria, no hematuria, some increased urinary frequency, more difficulty urinating- less volume. Has not taken anything for paon. Pain is constant, like a bad menstrual cramp. No constipation or diarrhea, had normal bm this morning. Had some sweats last night. No unusual activity yesterday. No known injury/strain/falls.    Past Medical History:  Diagnosis Date  . Allergy    seasonal  . Carpal tunnel syndrome   . Cervical disc disease   . GERD (gastroesophageal reflux disease)   . Hypertension   . Migraine   . PMDD (premenstrual dysphoric disorder)    Past Surgical History:  Procedure Laterality Date  . CESAREAN SECTION    . CRYOABLATION  11/90   secondary to dysplasia  . lipoma removal  2006  . PILONIDAL CYST EXCISION  1980s   x2  . plantar fasciitis  8/12   surgery with Dr Milinda Pointer  . SPINE SURGERY  03/11   cervical spine surgery   Family History  Problem Relation Age of Onset  . Hyperlipidemia Mother   . Cancer Mother        breast CA  . Cancer Father        adrenal cancer  . Colon polyps Father   . Hyperlipidemia Father   . Hyperlipidemia Brother   . Cancer Cousin        breast CA  . Colon cancer Neg Hx    Social History   Tobacco Use  . Smoking status: Current Every Day Smoker    Packs/day: 1.00    Types: Cigarettes  . Smokeless tobacco: Never Used  Substance Use Topics  . Alcohol use: No    Alcohol/week: 0.0 oz  . Drug use: No      Review of Systems Per HPI    Objective:   Physical Exam  Constitutional: She is oriented to person, place, and time. She appears well-developed and well-nourished. She appears ill. No distress.  HENT:  Head: Normocephalic and atraumatic.  Neck: Normal range of motion. Neck supple.    Cardiovascular: Normal rate, regular rhythm and normal heart sounds.  Pulmonary/Chest: Effort normal and breath sounds normal.  Abdominal: Soft. Bowel sounds are normal. She exhibits no distension. There is tenderness in the left lower quadrant. There is no rebound, no guarding, no CVA tenderness, no tenderness at McBurney's point and negative Murphy's sign.  Neurological: She is alert and oriented to person, place, and time.  Skin: Skin is warm and dry. She is not diaphoretic.  Psychiatric: She has a normal mood and affect. Her behavior is normal. Judgment and thought content normal.  Vitals reviewed.     BP 124/82 (BP Location: Right Arm, Patient Position: Sitting, Cuff Size: Normal)   Pulse 80   Temp 98 F (36.7 C) (Oral)   Wt 162 lb (73.5 kg)   SpO2 98%   BMI 24.27 kg/m  Results for orders placed or performed in visit on 11/25/17  POCT urinalysis dipstick  Result Value Ref Range   Color, UA yellow    Clarity, UA clear    Glucose, UA neg    Bilirubin, UA neg    Ketones, UA neg    Spec Grav, UA >=1.030 (A) 1.010 - 1.025   Blood,  UA 1+    pH, UA 6.0 5.0 - 8.0   Protein, UA 15    Urobilinogen, UA 0.2 0.2 or 1.0 E.U./dL   Nitrite, UA neg    Leukocytes, UA Negative Negative   Appearance     Odor    Dg Abd 1 View  Result Date: 11/25/2017 CLINICAL DATA:  LEFT lower quadrant pain with hematuria. EXAM: ABDOMEN - 1 VIEW COMPARISON:  None. FINDINGS: The bowel gas pattern is normal. No radio-opaque calculi or other significant radiographic abnormality are seen. Suspected pelvic phleboliths. IMPRESSION: Negative. Electronically Signed   By: Staci Righter M.D.   On: 11/25/2017 09:10    Patient given ketorolac 60 mg IM with good relief of pain.     Assessment & Plan:  1. Low back pain, unspecified back pain laterality, unspecified chronicity, with sciatica presence unspecified - POCT urinalysis dipstick  2. Left lower quadrant pain - unclear etiology, KUB, urinalysis reassuring,  pain improved with ketorolac - Can not rule out kidney stone, have provided tamsulosin if any difficulty with stream - If pain persists, she will let me know and will need to consider abd CT - DG Abd 1 View; Future - ketorolac (TORADOL) injection 60 mg - tamsulosin (FLOMAX) 0.4 MG CAPS capsule; Take 1 capsule (0.4 mg total) by mouth daily.  Dispense: 30 capsule; Refill: 3  3. Other microscopic hematuria - DG Abd 1 View; Future - tamsulosin (FLOMAX) 0.4 MG CAPS capsule; Take 1 capsule (0.4 mg total) by mouth daily.  Dispense: 30 capsule; Refill: 3  - follow up with Dr. Glori Bickers for recheck of microscopic hematuria  Clarene Reamer, FNP-BC  [facility], [place]  11/25/2017 9:21 PM

## 2018-04-06 ENCOUNTER — Encounter: Payer: Self-pay | Admitting: Gastroenterology

## 2018-08-05 ENCOUNTER — Other Ambulatory Visit: Payer: Self-pay | Admitting: Family Medicine

## 2018-08-05 NOTE — Telephone Encounter (Signed)
Please schedule PE after 09/02/18 and refill until then [facility] to schedule on a thurs am

## 2018-08-05 NOTE — Telephone Encounter (Signed)
No recent or future appts., please advise  

## 2018-08-05 NOTE — Telephone Encounter (Signed)
Med refilled once and Carrie will reach out to pt to get appt scheduled  

## 2018-08-19 ENCOUNTER — Encounter: Payer: Self-pay | Admitting: Gastroenterology

## 2018-08-23 ENCOUNTER — Telehealth: Payer: Self-pay | Admitting: Family Medicine

## 2018-08-23 DIAGNOSIS — Z0000 Encounter for general adult medical examination without abnormal findings: Secondary | ICD-10-CM

## 2018-08-23 DIAGNOSIS — E7800 Pure hypercholesterolemia, unspecified: Secondary | ICD-10-CM

## 2018-08-23 DIAGNOSIS — I10 Essential (primary) hypertension: Secondary | ICD-10-CM

## 2018-08-23 NOTE — Telephone Encounter (Signed)
-----   Message from Lendon Collar, RT sent at 08/23/2018  9:48 AM EDT ----- Regarding: Lab orders for Thursday 09/02/18 Please enter CPE lab orders for 09/02/18. Thanks!

## 2018-09-01 ENCOUNTER — Other Ambulatory Visit: Payer: Self-pay | Admitting: *Deleted

## 2018-09-01 MED ORDER — CEVIMELINE HCL 30 MG PO CAPS: ORAL_CAPSULE | ORAL | 0 refills | Status: DC

## 2018-09-02 ENCOUNTER — Other Ambulatory Visit (INDEPENDENT_AMBULATORY_CARE_PROVIDER_SITE_OTHER): Payer: BLUE CROSS/BLUE SHIELD

## 2018-09-02 DIAGNOSIS — E7800 Pure hypercholesterolemia, unspecified: Secondary | ICD-10-CM

## 2018-09-02 DIAGNOSIS — I10 Essential (primary) hypertension: Secondary | ICD-10-CM | POA: Diagnosis not present

## 2018-09-02 LAB — CBC WITH DIFFERENTIAL/PLATELET
BASOS PCT: 1.2 % (ref 0.0–3.0)
Basophils Absolute: 0.1 10*3/uL (ref 0.0–0.1)
EOS ABS: 0.4 10*3/uL (ref 0.0–0.7)
EOS PCT: 5.9 % — AB (ref 0.0–5.0)
HEMATOCRIT: 49.8 % — AB (ref 36.0–46.0)
HEMOGLOBIN: 16.7 g/dL — AB (ref 12.0–15.0)
LYMPHS PCT: 41.9 % (ref 12.0–46.0)
Lymphs Abs: 2.7 10*3/uL (ref 0.7–4.0)
MCHC: 33.6 g/dL (ref 30.0–36.0)
MCV: 92.3 fl (ref 78.0–100.0)
MONOS PCT: 8.3 % (ref 3.0–12.0)
Monocytes Absolute: 0.5 10*3/uL (ref 0.1–1.0)
Neutro Abs: 2.7 10*3/uL (ref 1.4–7.7)
Neutrophils Relative %: 42.7 % — ABNORMAL LOW (ref 43.0–77.0)
Platelets: 249 10*3/uL (ref 150.0–400.0)
RBC: 5.39 Mil/uL — ABNORMAL HIGH (ref 3.87–5.11)
RDW: 12.8 % (ref 11.5–15.5)
WBC: 6.4 10*3/uL (ref 4.0–10.5)

## 2018-09-02 LAB — COMPREHENSIVE METABOLIC PANEL
ALBUMIN: 4.5 g/dL (ref 3.5–5.2)
ALT: 10 U/L (ref 0–35)
AST: 9 U/L (ref 0–37)
Alkaline Phosphatase: 76 U/L (ref 39–117)
BUN: 14 mg/dL (ref 6–23)
CALCIUM: 9.6 mg/dL (ref 8.4–10.5)
CHLORIDE: 106 meq/L (ref 96–112)
CO2: 28 mEq/L (ref 19–32)
CREATININE: 0.69 mg/dL (ref 0.40–1.20)
GFR: 92.94 mL/min (ref >60.00)
Glucose, Bld: 93 mg/dL (ref 70–99)
POTASSIUM: 4.2 meq/L (ref 3.5–5.1)
Sodium: 142 mEq/L (ref 135–145)
Total Bilirubin: 0.6 mg/dL (ref 0.2–1.2)
Total Protein: 7.1 g/dL (ref 6.0–8.3)

## 2018-09-02 LAB — LIPID PANEL
CHOLESTEROL: 241 mg/dL — AB (ref 0–200)
HDL: 49.9 mg/dL (ref >39.00)
LDL CALC: 169 mg/dL — AB (ref 0–99)
NonHDL: 190.91
TRIGLYCERIDES: 110 mg/dL (ref 0.0–149.0)
Total CHOL/HDL Ratio: 5
VLDL: 22 mg/dL (ref 0.0–40.0)

## 2018-09-02 LAB — TSH: TSH: 1.22 u[IU]/mL (ref 0.35–4.50)

## 2018-09-09 ENCOUNTER — Ambulatory Visit (INDEPENDENT_AMBULATORY_CARE_PROVIDER_SITE_OTHER): Payer: BLUE CROSS/BLUE SHIELD | Admitting: Family Medicine

## 2018-09-09 ENCOUNTER — Encounter: Payer: Self-pay | Admitting: Family Medicine

## 2018-09-09 VITALS — BP 126/84 | HR 88 | Temp 98.5°F | Ht 67.75 in | Wt 151.8 lb

## 2018-09-09 DIAGNOSIS — Z0000 Encounter for general adult medical examination without abnormal findings: Secondary | ICD-10-CM | POA: Diagnosis not present

## 2018-09-09 DIAGNOSIS — I10 Essential (primary) hypertension: Secondary | ICD-10-CM

## 2018-09-09 DIAGNOSIS — Z23 Encounter for immunization: Secondary | ICD-10-CM | POA: Diagnosis not present

## 2018-09-09 DIAGNOSIS — Z1211 Encounter for screening for malignant neoplasm of colon: Secondary | ICD-10-CM

## 2018-09-09 DIAGNOSIS — Z1231 Encounter for screening mammogram for malignant neoplasm of breast: Secondary | ICD-10-CM

## 2018-09-09 DIAGNOSIS — E7800 Pure hypercholesterolemia, unspecified: Secondary | ICD-10-CM

## 2018-09-09 DIAGNOSIS — F17200 Nicotine dependence, unspecified, uncomplicated: Secondary | ICD-10-CM

## 2018-09-09 MED ORDER — CEVIMELINE HCL 30 MG PO CAPS: ORAL_CAPSULE | ORAL | 11 refills | Status: DC

## 2018-09-09 MED ORDER — AMLODIPINE BESYLATE 5 MG PO TABS: 5.0000 mg | ORAL_TABLET | Freq: Every day | ORAL | 3 refills | Status: DC

## 2018-09-09 NOTE — Patient Instructions (Addendum)
You need at least 64 oz of fluids per day- mostly water   If you are interested in the new shingles vaccine (Shingrix) - call your local pharmacy to check on coverage and availability  If affordable, get on a wait list at your pharmacy to get the vaccine.  Get rid of fried food except for special occasion  Red meat once a month or less Avoid sausage/ bacon  Also shellfish   Avoid red meat/ fried foods/ egg yolks/ fatty breakfast meats/ butter, cheese and high fat dairy/ and shellfish    Flu vaccine today   We will schedule your mammogram on the way out

## 2018-09-09 NOTE — Progress Notes (Signed)
Subjective:    Patient ID: Carolyn Cordova, female    DOB: 05/14/1961, 57 y.o.   MRN: [id number]  HPI Here for health maintenance exam and to review chronic medical problems    Wt Readings from Last 3 Encounters:  09/09/18 151 lb 12 oz (68.8 kg)  11/25/17 162 lb (73.5 kg)  09/02/17 166 lb (75.3 kg)  loosing weight  Active job- rides a bike around [employer] less- not as hungry and busy and stress Quit tea and soft drinks  23.24 kg/m   Mammogram 6/16 - 3 y overdue -will schedule Mother with breast cancer  Self breast exam -no lumps   Colonoscopy 6/16 with 3 y recall  Set for November-scheduled a colonoscopy   Flu shot- given today   Pap 10/18 -neg with neg HPV  No symptoms or problems  No periods for years   Tetanus vac 8/14  Hep C and HIV screening are up to date and neg   Zoster status- had shingles in the past  Interested in shingrix   Smoking status - no change  Tried to cut down- then had stress  ? If interested in lung cancer screening  No copd symptoms    bp is stable today  No cp or palpitations or headaches or edema  No side effects to medicines  BP Readings from Last 3 Encounters:  09/09/18 126/84  11/25/17 124/82  09/02/17 112/70      Hyperlipidemia Lab Results  Component Value Date   CHOL 241 (H) 09/02/2018   CHOL 240 (H) 08/05/2017   CHOL 148 12/19/2015   Lab Results  Component Value Date   HDL 49.90 09/02/2018   HDL 44.20 08/05/2017   HDL 36.20 (L) 12/19/2015   Lab Results  Component Value Date   LDLCALC 169 (H) 09/02/2018   LDLCALC 92 12/19/2015   LDLCALC 113 (H) 04/10/2015   Lab Results  Component Value Date   TRIG 110.0 09/02/2018   TRIG 317.0 (H) 08/05/2017   TRIG 95.0 12/19/2015   Lab Results  Component Value Date   CHOLHDL 5 09/02/2018   CHOLHDL 5 08/05/2017   CHOLHDL 4 12/19/2015   Lab Results  Component Value Date   LDLDIRECT 150.0 08/05/2017   LDLDIRECT 217.5 12/11/2010   LDLDIRECT 185.0  05/10/2009  has been on statins Had muscle pain - with generic crestor and lipitor  She has cut back beef  Fried food 2 times per week  occ fried oysters   Lab Results  Component Value Date   WBC 6.4 09/02/2018   HGB 16.7 (H) 09/02/2018   HCT 49.8 (H) 09/02/2018   MCV 92.3 09/02/2018   PLT 249.0 09/02/2018   Lab Results  Component Value Date   CREATININE 0.69 09/02/2018   BUN 14 09/02/2018   NA 142 09/02/2018   K 4.2 09/02/2018   CL 106 09/02/2018   CO2 28 09/02/2018   Lab Results  Component Value Date   ALT 10 09/02/2018   AST 9 09/02/2018   ALKPHOS 76 09/02/2018   BILITOT 0.6 09/02/2018   Lab Results  Component Value Date   TSH 1.22 09/02/2018    Patient Active Problem List   Diagnosis Date Noted  . Encounter for hepatitis C screening test for low risk patient 09/02/2017  . Encounter for screening for HIV 09/02/2017  . Screening mammogram, encounter for 09/02/2017  . Stress reaction 07/30/2016  . Colon cancer screening 04/10/2015  . Internal hemorrhoid 04/10/2015  . Encounter for  routine gynecological examination 03/17/2014  . Routine general medical examination at a health care facility 03/09/2014  . H/O herpes zoster 08/09/2012  . Dry mouth 04/20/2012  . Elevated rheumatoid factor 04/20/2012  . Atrophic vaginitis 10/01/2011  . HERNIATED CERVICAL DISC 09/14/2009  . PURE HYPERCHOLESTEROLEMIA 05/10/2009  . TOBACCO ABUSE 08/19/2007  . CARPAL TUNNEL SYNDROME, BILATERAL 08/19/2007  . Essential hypertension 08/19/2007  . HEMORRHOIDS 08/19/2007  . History of cervical dysplasia 08/19/2007  . MIGRAINES, HX OF 08/19/2007   Past Medical History:  Diagnosis Date  . Allergy    seasonal  . Carpal tunnel syndrome   . Cervical disc disease   . GERD (gastroesophageal reflux disease)   . Hypertension   . Migraine   . PMDD (premenstrual dysphoric disorder)    Past Surgical History:  Procedure Laterality Date  . CESAREAN SECTION    . CRYOABLATION  11/90    secondary to dysplasia  . lipoma removal  2006  . PILONIDAL CYST EXCISION  1980s   x2  . plantar fasciitis  8/12   surgery with Dr Milinda Pointer  . SPINE SURGERY  03/11   cervical spine surgery   Social History   Tobacco Use  . Smoking status: Current Every Day Smoker    Packs/day: 1.00    Types: Cigarettes  . Smokeless tobacco: Never Used  Substance Use Topics  . Alcohol use: No    Alcohol/week: 0.0 standard drinks  . Drug use: No   Family History  Problem Relation Age of Onset  . Hyperlipidemia Mother   . Cancer Mother        breast CA  . Cancer Father        adrenal cancer  . Colon polyps Father   . Hyperlipidemia Father   . Hyperlipidemia Brother   . Cancer Cousin        breast CA  . Colon cancer Neg Hx    Allergies  Allergen Reactions  . Adhesive [Tape]   . Amoxicillin-Pot Clavulanate     REACTION: rash  . Codeine     REACTION: ? reaction  . Fexofenadine     REACTION: nausea  . Sertraline Hcl     REACTION: sedation  . Varenicline Tartrate     REACTION: made her feel sick and did not work  . Wellbutrin [Bupropion] Other (See Comments)   Current Outpatient Medications on File Prior to Visit  Medication Sig Dispense Refill  . aspirin 81 MG tablet Take 81 mg by mouth daily.      . cetirizine (ZYRTEC) 10 MG tablet Take 10 mg by mouth daily.     No current facility-administered medications on file prior to visit.     Review of Systems  Constitutional: Negative for activity change, appetite change, fatigue, fever and unexpected weight change.  HENT: Negative for congestion, ear pain, rhinorrhea, sinus pressure and sore throat.   Eyes: Negative for pain, redness and visual disturbance.  Respiratory: Negative for cough, shortness of breath and wheezing.   Cardiovascular: Negative for chest pain and palpitations.  Gastrointestinal: Negative for abdominal pain, blood in stool, constipation and diarrhea.  Endocrine: Negative for polydipsia and polyuria.    Genitourinary: Negative for dysuria, frequency and urgency.  Musculoskeletal: Negative for arthralgias, back pain and myalgias.  Skin: Negative for pallor and rash.  Allergic/Immunologic: Negative for environmental allergies.  Neurological: Negative for dizziness, syncope and headaches.  Hematological: Negative for adenopathy. Does not bruise/bleed easily.  Psychiatric/Behavioral: Negative for decreased concentration and dysphoric mood. The patient  is not nervous/anxious.        Stressors       Objective:   Physical Exam  Constitutional: She appears well-developed and well-nourished. No distress.  Well appearing   HENT:  Head: Normocephalic and atraumatic.  Right Ear: External ear normal.  Left Ear: External ear normal.  Mouth/Throat: Oropharynx is clear and moist.  Boggy nares  Eyes: Pupils are equal, round, and reactive to light. Conjunctivae and EOM are normal. No scleral icterus.  Neck: Normal range of motion. Neck supple. No JVD present. Carotid bruit is not present. No thyromegaly present.  Cardiovascular: Normal rate, regular rhythm, normal heart sounds and intact distal pulses. Exam reveals no gallop.  Pulmonary/Chest: Effort normal and breath sounds normal. No stridor. No respiratory distress. She has no wheezes. She has no rales. She exhibits no tenderness. No breast tenderness, discharge or bleeding.  Good air exch  No wheeze  Abdominal: Soft. Bowel sounds are normal. She exhibits no distension, no abdominal bruit and no mass. There is no tenderness.  Genitourinary: No breast tenderness, discharge or bleeding.  Genitourinary Comments: Breast exam: No mass, nodules, thickening, tenderness, bulging, retraction, inflamation, nipple discharge or skin changes noted.  No axillary or clavicular LA.      Musculoskeletal: Normal range of motion. She exhibits no edema or tenderness.  Lymphadenopathy:    She has no cervical adenopathy.  Neurological: She is alert. She has normal  reflexes. No cranial nerve deficit. She exhibits normal muscle tone. Coordination normal.  Skin: Skin is warm and dry. No rash noted. No erythema. No pallor.  Solar lentigines diffusely  Raised lesion consistent with dermatofibroma on upper R arm   Scattered sks   Psychiatric: She has a normal mood and affect.          Assessment & Plan:   Problem List Items Addressed This Visit      Cardiovascular and Mediastinum   Essential hypertension    bp in fair control at this time  BP Readings from Last 1 Encounters:  09/09/18 126/84   No changes needed Most recent labs reviewed  Disc lifstyle change with low sodium diet and exercise        Relevant Medications   amLODipine (NORVASC) 5 MG tablet     Other   Colon cancer screening    Due for 3 y colonoscopy- pt has it scheduled for November      PURE HYPERCHOLESTEROLEMIA    Disc goals for lipids and reasons to control them Rev last labs with pt Rev low sat fat diet in detail Pt is intolerant of statins (including generic crestor at low dose)  Disc goals for lipids and reasons to control them Rev last labs with pt Rev low sat fat diet in detail Eats fried food 2 times weekly-will try to cut that Disc risks of vascular dz esp with smoking      Relevant Medications   amLODipine (NORVASC) 5 MG tablet   Routine general medical examination at a health care facility - Primary    Reviewed health habits including diet and exercise and skin cancer prevention Reviewed appropriate screening tests for age  Also reviewed health mt list, fam hx and immunization status , as well as social and family history   See HPI Lab reviewed  Mammogram scheduled Flu shot given  Colonoscopy planned for next mo  Counseled on smoking cessation        Screening mammogram, encounter for    Scheduled annual screening mammogram Nl  breast exam today  Encouraged monthly self exams        Relevant Orders   MM 3D SCREEN BREAST BILATERAL    TOBACCO ABUSE    Disc in detail risks of smoking and possible outcomes including copd, vascular/ heart disease, cancer , respiratory and sinus infections  Pt voices understanding Pt states she is not ready to quit        Other Visit Diagnoses    Need for influenza vaccination       Relevant Orders   Flu Vaccine QUAD 6+ mos PF IM (Fluarix Quad PF) (Completed)

## 2018-09-09 NOTE — Assessment & Plan Note (Signed)
Reviewed health habits including diet and exercise and skin cancer prevention Reviewed appropriate screening tests for age  Also reviewed health mt list, fam hx and immunization status , as well as social and family history   See HPI Lab reviewed  Mammogram scheduled Flu shot given  Colonoscopy planned for next mo  Counseled on smoking cessation

## 2018-09-09 NOTE — Assessment & Plan Note (Signed)
Due for 3 y colonoscopy- pt has it scheduled for November

## 2018-09-09 NOTE — Assessment & Plan Note (Signed)
Disc goals for lipids and reasons to control them Rev last labs with pt Rev low sat fat diet in detail Pt is intolerant of statins (including generic crestor at low dose)  Disc goals for lipids and reasons to control them Rev last labs with pt Rev low sat fat diet in detail Eats fried food 2 times weekly-will try to cut that Disc risks of vascular dz esp with smoking

## 2018-09-09 NOTE — Assessment & Plan Note (Signed)
bp in fair control at this time  BP Readings from Last 1 Encounters:  09/09/18 126/84   No changes needed Most recent labs reviewed  Disc lifstyle change with low sodium diet and exercise

## 2018-09-09 NOTE — Assessment & Plan Note (Signed)
Scheduled annual screening mammogram Nl breast exam today  Encouraged monthly self exams   

## 2018-09-09 NOTE — Assessment & Plan Note (Signed)
Disc in detail risks of smoking and possible outcomes including copd, vascular/ heart disease, cancer , respiratory and sinus infections  Pt voices understanding Pt states she is not ready to quit  

## 2018-09-15 ENCOUNTER — Ambulatory Visit (AMBULATORY_SURGERY_CENTER): Payer: Self-pay | Admitting: *Deleted

## 2018-09-15 ENCOUNTER — Encounter: Payer: Self-pay | Admitting: Gastroenterology

## 2018-09-15 VITALS — Ht 67.75 in | Wt 152.0 lb

## 2018-09-15 DIAGNOSIS — Z860100 Personal history of colon polyps, unspecified: Secondary | ICD-10-CM

## 2018-09-15 MED ORDER — NA SULFATE-K SULFATE-MG SULF 17.5-3.13-1.6 GM/177ML PO SOLN: 1.0000 | Freq: Once | ORAL | 0 refills | Status: AC

## 2018-09-15 NOTE — Progress Notes (Signed)
No egg or soy allergy known to patient  No issues with past sedation with any surgeries  or procedures, no intubation problems  No diet pills per patient No home 02 use per patient  No blood thinners per patient  Pt denies issues with constipation  No A fib or A flutter  EMMI video sent to pt's e mail - pt declined  suprep $15 coupon to pt today

## 2018-09-29 ENCOUNTER — Encounter: Payer: Self-pay | Admitting: Gastroenterology

## 2018-09-29 ENCOUNTER — Ambulatory Visit (AMBULATORY_SURGERY_CENTER): Payer: BLUE CROSS/BLUE SHIELD | Admitting: Gastroenterology

## 2018-09-29 VITALS — BP 129/66 | HR 66 | Temp 99.3°F | Resp 21 | Ht 67.75 in | Wt 151.0 lb

## 2018-09-29 DIAGNOSIS — K621 Rectal polyp: Secondary | ICD-10-CM | POA: Diagnosis not present

## 2018-09-29 DIAGNOSIS — D124 Benign neoplasm of descending colon: Secondary | ICD-10-CM

## 2018-09-29 DIAGNOSIS — Z860100 Personal history of colon polyps, unspecified: Secondary | ICD-10-CM

## 2018-09-29 DIAGNOSIS — K635 Polyp of colon: Secondary | ICD-10-CM

## 2018-09-29 DIAGNOSIS — D125 Benign neoplasm of sigmoid colon: Secondary | ICD-10-CM

## 2018-09-29 DIAGNOSIS — D123 Benign neoplasm of transverse colon: Secondary | ICD-10-CM

## 2018-09-29 DIAGNOSIS — D128 Benign neoplasm of rectum: Secondary | ICD-10-CM

## 2018-09-29 DIAGNOSIS — D127 Benign neoplasm of rectosigmoid junction: Secondary | ICD-10-CM | POA: Diagnosis not present

## 2018-09-29 DIAGNOSIS — D129 Benign neoplasm of anus and anal canal: Secondary | ICD-10-CM

## 2018-09-29 MED ORDER — SODIUM CHLORIDE 0.9 % IV SOLN: 500.0000 mL | Freq: Once | INTRAVENOUS | Status: DC

## 2018-09-29 NOTE — Op Note (Signed)
[place] Patient Name: Carolyn Cordova Procedure Date: 09/29/2018 8:39 AM MRN: [id number] Endoscopist: Mauri Pole , MD Age: 57 Referring MD:  Date of Birth: 06-28-1961 Gender: Female Account #: [id number] Procedure:                Colonoscopy Indications:              High risk colon cancer surveillance: Personal                            history of multiple (3 or more) adenomas, Last                            colonoscopy: 2016 Medicines:                Monitored Anesthesia Care Procedure:                Pre-Anesthesia Assessment:                           - Prior to the procedure, a History and Physical                            was performed, and patient medications and                            allergies were reviewed. The patient's tolerance of                            previous anesthesia was also reviewed. The risks                            and benefits of the procedure and the sedation                            options and risks were discussed with the patient.                            All questions were answered, and informed consent                            was obtained. Prior Anticoagulants: The patient has                            taken no previous anticoagulant or antiplatelet                            agents. ASA Grade Assessment: II - A patient with                            mild systemic disease. After reviewing the risks                            and benefits, the patient was deemed in  satisfactory condition to undergo the procedure.                           After obtaining informed consent, the colonoscope                            was passed under direct vision. Throughout the                            procedure, the patient's blood pressure, pulse, and                            oxygen saturations were monitored continuously. The                            Colonoscope was introduced through the  anus and                            advanced to the the cecum, identified by                            appendiceal orifice and ileocecal valve. The                            colonoscopy was performed without difficulty. The                            patient tolerated the procedure well. The quality                            of the bowel preparation was excellent. The                            ileocecal valve, appendiceal orifice, and rectum                            were photographed. Scope In: 8:55:50 AM Scope Out: 9:14:42 AM Scope Withdrawal Time: 0 hours 13 minutes 49 seconds  Total Procedure Duration: 0 hours 18 minutes 52 seconds  Findings:                 The perianal and digital rectal examinations were                            normal.                           Two sessile polyps were found in the descending                            colon and transverse colon. The polyps were 1 to 2                            mm in size. These polyps were removed with a cold  biopsy forceps. Resection and retrieval were                            complete.                           Two sessile polyps were found in the rectum and                            transverse colon. The polyps were 4 to 6 mm in                            size. These polyps were removed with a cold snare.                            Resection and retrieval were complete.                           A 11 mm polyp was found in the sigmoid colon. The                            polyp was pedunculated. The polyp was removed with                            a hot snare. Resection and retrieval were complete.                           Multiple small and large-mouthed diverticula were                            found in the sigmoid colon and descending colon.                           Non-bleeding internal hemorrhoids were found during                            retroflexion. The hemorrhoids were  medium-sized. Complications:            No immediate complications. Estimated Blood Loss:     Estimated blood loss was minimal. Impression:               - Two 1 to 2 mm polyps in the descending colon and                            in the transverse colon, removed with a cold biopsy                            forceps. Resected and retrieved.                           - Two 4 to 6 mm polyps in the rectum and in the  transverse colon, removed with a cold snare.                            Resected and retrieved.                           - One 11 mm polyp in the sigmoid colon, removed                            with a hot snare. Resected and retrieved.                           - Moderate diverticulosis in the sigmoid colon and                            in the descending colon.                           - Non-bleeding internal hemorrhoids. Recommendation:           - Patient has a contact number available for                            emergencies. The signs and symptoms of potential                            delayed complications were discussed with the                            patient. Return to normal activities tomorrow.                            Written discharge instructions were provided to the                            patient.                           - Resume previous diet.                           - Continue present medications.                           - Await pathology results.                           - Repeat colonoscopy in 3 years for surveillance                            based on pathology results. Mauri Pole, MD 09/29/2018 9:21:42 AM This report has been signed electronically.

## 2018-09-29 NOTE — Progress Notes (Signed)
Called to room to assist during endoscopic procedure.  Patient ID and intended procedure confirmed with present staff. Received instructions for my participation in the procedure from the performing physician.  

## 2018-09-29 NOTE — Progress Notes (Signed)
No problems noted in the recovery room. maw 

## 2018-09-29 NOTE — Progress Notes (Signed)
Report to PACU, RN, vss, BBS= Clear.  

## 2018-09-29 NOTE — Patient Instructions (Signed)
YOU HAD AN ENDOSCOPIC PROCEDURE TODAY AT THE [facility]:   Refer to the procedure report that was given to you for any specific questions about what was found during the examination.  If the procedure report does not answer your questions, please call your gastroenterologist to clarify.  If you requested that your care partner not be given the details of your procedure findings, then the procedure report has been included in a sealed envelope for you to review at your convenience later.  YOU SHOULD EXPECT: Some feelings of bloating in the abdomen. Passage of more gas than usual.  Walking can help get rid of the air that was put into your GI tract during the procedure and reduce the bloating. If you had a lower endoscopy (such as a colonoscopy or flexible sigmoidoscopy) you may notice spotting of blood in your stool or on the toilet paper. If you underwent a bowel prep for your procedure, you may not have a normal bowel movement for a few days.  Please Note:  You might notice some irritation and congestion in your nose or some drainage.  This is from the oxygen used during your procedure.  There is no need for concern and it should clear up in a day or so.  SYMPTOMS TO REPORT IMMEDIATELY:   Following lower endoscopy (colonoscopy or flexible sigmoidoscopy):  Excessive amounts of blood in the stool  Significant tenderness or worsening of abdominal pains  Swelling of the abdomen that is new, acute  Fever of 100F or higher   For urgent or emergent issues, a gastroenterologist can be reached at any hour by calling [phone].   DIET:  We do recommend a small meal at first, but then you may proceed to your regular diet.  Drink plenty of fluids but you should avoid alcoholic beverages for 24 hours.  ACTIVITY:  You should plan to take it easy for the rest of today and you should NOT DRIVE or use heavy machinery until tomorrow (because of the sedation medicines used during the test).     FOLLOW UP: Our staff will call the number listed on your records the next business day following your procedure to check on you and address any questions or concerns that you may have regarding the information given to you following your procedure. If we do not reach you, we will leave a message.  However, if you are feeling well and you are not experiencing any problems, there is no need to return our call.  We will assume that you have returned to your regular daily activities without incident.  If any biopsies were taken you will be contacted by phone or by letter within the next 1-3 weeks.  Please call us at [phone] if you have not heard about the biopsies in 3 weeks.    SIGNATURES/CONFIDENTIALITY: You and/or your care partner have signed paperwork which will be entered into your electronic medical record.  These signatures attest to the fact that that the information above on your After Visit Summary has been reviewed and is understood.  Full responsibility of the confidentiality of this discharge information lies with you and/or your care-partner.   Handouts were given to your care partner on polyps, diverticulosis, and hemorrhoids. You may resume your current medications today. Await biopsy results. Please call if any questions or concerns.   

## 2018-09-30 ENCOUNTER — Telehealth: Payer: Self-pay | Admitting: *Deleted

## 2018-09-30 NOTE — Telephone Encounter (Signed)
  Follow up Call-  Call back number 09/29/2018  Post procedure Call Back phone  # [phone]  Permission to leave phone message Yes  Some recent data might be hidden     Patient questions:  Do you have a fever, pain , or abdominal swelling? No. Pain Score  0 *  Have you tolerated food without any problems? Yes.    Have you been able to return to your normal activities? Yes.    Do you have any questions about your discharge instructions: Diet   No. Medications  No. Follow up visit  No.  Do you have questions or concerns about your Care? Yes.    Actions: * If pain score is 4 or above: No action needed, pain <4.

## 2018-10-04 ENCOUNTER — Encounter: Payer: Self-pay | Admitting: Gastroenterology

## 2019-08-25 ENCOUNTER — Other Ambulatory Visit: Payer: Self-pay | Admitting: Family Medicine

## 2019-08-25 NOTE — Telephone Encounter (Signed)
Med refilled once and Carolyn Cordova will reach out to pt to try and schedule a CPE

## 2019-08-25 NOTE — Telephone Encounter (Signed)
Please schedule PE and refill until then  

## 2019-08-25 NOTE — Telephone Encounter (Signed)
Pt hasn't been seen in almost a year and no future appts., please advise  

## 2019-10-07 ENCOUNTER — Other Ambulatory Visit: Payer: Self-pay

## 2019-10-07 ENCOUNTER — Ambulatory Visit (INDEPENDENT_AMBULATORY_CARE_PROVIDER_SITE_OTHER): Payer: BC Managed Care – PPO | Admitting: Family Medicine

## 2019-10-07 ENCOUNTER — Encounter: Payer: Self-pay | Admitting: Family Medicine

## 2019-10-07 VITALS — BP 126/78 | HR 71 | Temp 98.4°F | Ht 67.0 in | Wt 142.4 lb

## 2019-10-07 DIAGNOSIS — F17200 Nicotine dependence, unspecified, uncomplicated: Secondary | ICD-10-CM

## 2019-10-07 DIAGNOSIS — Z0000 Encounter for general adult medical examination without abnormal findings: Secondary | ICD-10-CM | POA: Diagnosis not present

## 2019-10-07 DIAGNOSIS — E7800 Pure hypercholesterolemia, unspecified: Secondary | ICD-10-CM | POA: Diagnosis not present

## 2019-10-07 DIAGNOSIS — Z23 Encounter for immunization: Secondary | ICD-10-CM | POA: Diagnosis not present

## 2019-10-07 DIAGNOSIS — Z1231 Encounter for screening mammogram for malignant neoplasm of breast: Secondary | ICD-10-CM | POA: Diagnosis not present

## 2019-10-07 DIAGNOSIS — I10 Essential (primary) hypertension: Secondary | ICD-10-CM | POA: Diagnosis not present

## 2019-10-07 MED ORDER — CEVIMELINE HCL 30 MG PO CAPS: ORAL_CAPSULE | ORAL | 3 refills | Status: DC

## 2019-10-07 MED ORDER — AMLODIPINE BESYLATE 5 MG PO TABS: 5.0000 mg | ORAL_TABLET | Freq: Every day | ORAL | 3 refills | Status: DC

## 2019-10-07 NOTE — Progress Notes (Signed)
Subjective:    Pati.ent ID: Carolyn Cordova, female    DOB: [birth year], 59 y.o.   MRN: [id number]   This visit occurred during the SARS-CoV-2 public health emergency.  Safety protocols were in place, including screening questions prior to the visit, additional usage of staff PPE, and extensive cleaning of exam room while observing appropriate contact time as indicated for disinfecting solutions.   HPI  Here for health maintenance exam and to review chronic medical problems   Being safe during the pandemic   Her daughter gets tested before she comes home  She still goes to work    [employer] from Last 3 Encounters:  10/07/19 142 lb 6 oz (64.6 kg)  09/29/18 151 lb (68.5 kg)  09/15/18 152 lb (68.9 kg)  lost 11 more lb  Drinking water and non calorie beverages  Not eating out  22.30 kg/m   Mammogram 6/16- she was going to get one but did not due to covid  Self breast exam  Mother and cousin had breast cancer   Flu shot-given today  Pap 10/18 neg with neg HPV screen  No gyn problems or symptoms No new partners Not sexually active   Tdap 8/14  Colonoscopy 11/19 with 5 y recall   Zoster status - will check into the coverage of shingrix  Has had shingles in the paste    bp is stable today  No cp or palpitations or headaches or edema  No side effects to medicines  BP Readings from Last 3 Encounters:  10/07/19 126/78  09/29/18 129/66  09/09/18 126/84     Smoking status  Still smoking about 1 ppd  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 Encounter for hepatitis C screening test for low risk patient 09/02/2017  . Encounter for screening for HIV 09/02/2017  . Screening mammogram, encounter for 09/02/2017  . Stress reaction 07/30/2016  . Colon cancer screening 04/10/2015  . Internal hemorrhoid 04/10/2015  . Encounter for routine gynecological examination 03/17/2014  . Routine general medical examination at a health care facility 03/09/2014  . H/O herpes zoster 08/09/2012  . Dry mouth 04/20/2012  . Elevated rheumatoid factor 04/20/2012  . Atrophic vaginitis 10/01/2011  . HERNIATED CERVICAL DISC 09/14/2009  . PURE HYPERCHOLESTEROLEMIA 05/10/2009  . TOBACCO ABUSE 08/19/2007  . CARPAL TUNNEL SYNDROME, BILATERAL 08/19/2007  . Essential hypertension 08/19/2007  . HEMORRHOIDS 08/19/2007  . History of cervical dysplasia 08/19/2007  . MIGRAINES, HX OF 08/19/2007   Past Medical History:  Diagnosis Date  . Allergy    seasonal  . Carpal tunnel syndrome   . Cervical disc disease   . GERD (gastroesophageal reflux disease)   . Hyperlipidemia   . Hypertension   . Migraine   . PMDD (premenstrual dysphoric disorder)    Past Surgical History:  Procedure Laterality Date  . CESAREAN SECTION    . COLONOSCOPY    .  CRYOABLATION  11/90   secondary to dysplasia  . lipoma removal  2006  . PILONIDAL CYST EXCISION  1980s   x2  . plantar fasciitis  8/12   surgery with Dr Milinda Pointer  . POLYPECTOMY    . SPINE SURGERY  03/11   cervical spine surgery   Social History   Tobacco Use  . Smoking status: Current Every Day Smoker    Packs/day: 1.00    Types: Cigarettes  . Smokeless tobacco: Never Used  Substance Use Topics  . Alcohol use: No    Alcohol/week: 0.0 standard drinks  . Drug use: No   Family History  Problem Relation Age of Onset  . Hyperlipidemia Mother   . Cancer Mother        breast CA  . Breast cancer Mother   . Cancer Father         adrenal cancer  . Colon polyps Father   . Hyperlipidemia Father   . Hyperlipidemia Brother   . Cancer Cousin        breast CA  . Colon cancer Neg Hx   . Esophageal cancer Neg Hx   . Rectal cancer Neg Hx   . Stomach cancer Neg Hx    Allergies  Allergen Reactions  . Adhesive [Tape]   . Amoxicillin-Pot Clavulanate     REACTION: rash  . Codeine     REACTION: ? reaction  . Fexofenadine     REACTION: nausea  . Sertraline Hcl     REACTION: sedation  . Varenicline Tartrate     REACTION: made her feel sick and did not work  . Wellbutrin [Bupropion] Other (See Comments)   Current Outpatient Medications on File Prior to Visit  Medication Sig Dispense Refill  . aspirin 81 MG tablet Take 81 mg by mouth daily.      . cetirizine (ZYRTEC) 10 MG tablet Take 10 mg by mouth daily.    . fluticasone (FLONASE) 50 MCG/ACT nasal spray Place 2 sprays into both nostrils daily as needed for allergies or rhinitis.     No current facility-administered medications on file prior to visit.     Review of Systems  Constitutional: Negative for activity change, appetite change, fatigue, fever and unexpected weight change.  HENT: Negative for congestion, ear pain, rhinorrhea, sinus pressure and sore throat.   Eyes: Negative for pain, redness and visual disturbance.  Respiratory: Negative for cough, shortness of breath and wheezing.   Cardiovascular: Negative for chest pain and palpitations.  Gastrointestinal: Negative for abdominal pain, blood in stool, constipation and diarrhea.  Endocrine: Negative for polydipsia and polyuria.  Genitourinary: Negative for dysuria, frequency and urgency.  Musculoskeletal: Negative for arthralgias, back pain and myalgias.  Skin: Negative for pallor and rash.  Allergic/Immunologic: Negative for environmental allergies.  Neurological: Negative for dizziness, syncope and headaches.  Hematological: Negative for adenopathy. Does not bruise/bleed easily.   Psychiatric/Behavioral: Negative for decreased concentration and dysphoric mood. The patient is not nervous/anxious.        Stressors are up and down        Objective:   Physical Exam Constitutional:      General: She is not in acute distress.    Appearance: Normal appearance. She is well-developed and normal weight. She is not ill-appearing or diaphoretic.  HENT:     Head: Normocephalic and atraumatic.     Right Ear: Tympanic membrane, ear canal and external ear normal.     Left Ear: Tympanic membrane, ear canal and external ear  normal.     Nose: Nose normal. No congestion.     Mouth/Throat:     Mouth: Mucous membranes are moist.     Pharynx: Oropharynx is clear. No posterior oropharyngeal erythema.  Eyes:     General: No scleral icterus.    Extraocular Movements: Extraocular movements intact.     Conjunctiva/sclera: Conjunctivae normal.     Pupils: Pupils are equal, round, and reactive to light.  Neck:     Musculoskeletal: Normal range of motion and neck supple. No neck rigidity or muscular tenderness.     Thyroid: No thyromegaly.     Vascular: No carotid bruit or JVD.  Cardiovascular:     Rate and Rhythm: Normal rate and regular rhythm.     Pulses: Normal pulses.     Heart sounds: Normal heart sounds. No gallop.   Pulmonary:     Effort: Pulmonary effort is normal. No respiratory distress.     Breath sounds: Normal breath sounds. No wheezing.     Comments: Good air exch bs are mildly distant Chest:     Chest wall: No tenderness.  Abdominal:     General: Bowel sounds are normal. There is no distension or abdominal bruit.     Palpations: Abdomen is soft. There is no mass.     Tenderness: There is no abdominal tenderness.     Hernia: No hernia is present.  Genitourinary:    Comments: Breast exam: No mass, nodules, thickening, tenderness, bulging, retraction, inflamation, nipple discharge or skin changes noted.  No axillary or clavicular LA.     Musculoskeletal: Normal  range of motion.        General: No tenderness.     Right lower leg: No edema.     Left lower leg: No edema.  Lymphadenopathy:     Cervical: No cervical adenopathy.  Skin:    General: Skin is warm and dry.     Coloration: Skin is not pale.     Findings: No erythema or rash.     Comments: Some lentigines  Few skin tags  Neurological:     Mental Status: She is alert. Mental status is at baseline.     Cranial Nerves: No cranial nerve deficit.     Motor: No abnormal muscle tone.     Coordination: Coordination normal.     Gait: Gait normal.     Deep Tendon Reflexes: Reflexes are normal and symmetric. Reflexes normal.  Psychiatric:        Mood and Affect: Mood normal.        Cognition and Memory: Cognition and memory normal.           Assessment & Plan:   Problem List Items Addressed This Visit      Cardiovascular and Mediastinum   Essential hypertension    bp in fair control at this time  BP Readings from Last 1 Encounters:  10/07/19 126/78   No changes needed Most recent labs reviewed  Disc lifstyle change with low sodium diet and exercise        Relevant Medications   amLODipine (NORVASC) 5 MG tablet   Other Relevant Orders   CBC w/Diff (Completed)   Comprehensive metabolic panel (Completed)   Lipid panel (Completed)   TSH (Completed)     Other   PURE HYPERCHOLESTEROLEMIA    Disc goals for lipids and reasons to control them Rev last labs with pt Rev low sat fat diet in detail Has not tolerated even low dose/intermittent statins in  the past  Lab today      Relevant Medications   amLODipine (NORVASC) 5 MG tablet   Other Relevant Orders   Lipid panel (Completed)   TOBACCO ABUSE    Disc in detail risks of smoking and possible outcomes including copd, vascular/ heart disease, cancer , respiratory and sinus infections  Pt voices understanding She does not feel ready to quit at this time Offered help when she is ready      Routine general medical  examination at a health care facility - Primary    Reviewed health habits including diet and exercise and skin cancer prevention Reviewed appropriate screening tests for age  Also reviewed health mt list, fam hx and immunization status , as well as social and family history   See HPI Labs ordered Flu shot given Enc strongly to quit smoking  Pt plans to schedule her own mammogram  Discussed the shingrix vaccine- she will have to check on coverage of this  Enc strongly to eat low cholesterol diet        Screening mammogram, encounter for    Enc pt strongly to schedule her mammogram  Enc self exams  No changes on exam today       Other Visit Diagnoses    Need for influenza vaccination       Relevant Orders   Flu Vaccine QUAD 6+ mos PF IM (Fluarix Quad PF) (Completed)

## 2019-10-07 NOTE — Patient Instructions (Addendum)
Please schedule your routine annual mammogram   If you are interested in the shingles vaccine series (Shingrix), call your insurance or pharmacy to check on coverage and location it must be given.  If affordable - you can schedule it here or at your pharmacy depending on coverage    Flu shot today   Labs today   Keep thinking about quitting smoking

## 2019-10-08 LAB — CBC WITH DIFFERENTIAL/PLATELET
Absolute Monocytes: 403 cells/uL (ref 200–950)
Basophils Absolute: 79 cells/uL (ref 0–200)
Basophils Relative: 1 %
Eosinophils Absolute: 221 cells/uL (ref 15–500)
Eosinophils Relative: 2.8 %
HCT: 42.9 % (ref 35.0–45.0)
Hemoglobin: 14.6 g/dL (ref 11.7–15.5)
Lymphs Abs: 3057 cells/uL (ref 850–3900)
MCH: 31.1 pg (ref 27.0–33.0)
MCHC: 34 g/dL (ref 32.0–36.0)
MCV: 91.3 fL (ref 80.0–100.0)
MPV: 10.5 fL (ref 7.5–12.5)
Monocytes Relative: 5.1 %
Neutro Abs: 4140 cells/uL (ref 1500–7800)
Neutrophils Relative %: 52.4 %
Platelets: 253 10*3/uL (ref 140–400)
RBC: 4.7 10*6/uL (ref 3.80–5.10)
RDW: 11.9 % (ref 11.0–15.0)
Total Lymphocyte: 38.7 %
WBC: 7.9 10*3/uL (ref 3.8–10.8)

## 2019-10-08 LAB — COMPREHENSIVE METABOLIC PANEL
AG Ratio: 2.3 (calc) (ref 1.0–2.5)
ALT: 13 U/L (ref 6–29)
AST: 14 U/L (ref 10–35)
Albumin: 4.4 g/dL (ref 3.6–5.1)
Alkaline phosphatase (APISO): 72 U/L (ref 37–153)
BUN: 11 mg/dL (ref 7–25)
CO2: 25 mmol/L (ref 20–32)
Calcium: 9.6 mg/dL (ref 8.6–10.4)
Chloride: 104 mmol/L (ref 98–110)
Creat: 0.67 mg/dL (ref 0.50–1.05)
Globulin: 1.9 g/dL (calc) (ref 1.9–3.7)
Glucose, Bld: 87 mg/dL (ref 65–99)
Potassium: 4.1 mmol/L (ref 3.5–5.3)
Sodium: 140 mmol/L (ref 135–146)
Total Bilirubin: 0.5 mg/dL (ref 0.2–1.2)
Total Protein: 6.3 g/dL (ref 6.1–8.1)

## 2019-10-08 LAB — LIPID PANEL
Cholesterol: 246 mg/dL — ABNORMAL HIGH (ref <200)
HDL: 60 mg/dL (ref >=50)
LDL Cholesterol (Calc): 167 mg/dL (calc) — ABNORMAL HIGH
Non-HDL Cholesterol (Calc): 186 mg/dL (calc) — ABNORMAL HIGH (ref <130)
Total CHOL/HDL Ratio: 4.1 (calc) (ref <5.0)
Triglycerides: 88 mg/dL (ref <150)

## 2019-10-08 LAB — TSH: TSH: 0.96 mIU/L (ref 0.40–4.50)

## 2019-10-09 NOTE — Assessment & Plan Note (Signed)
Reviewed health habits including diet and exercise and skin cancer prevention Reviewed appropriate screening tests for age  Also reviewed health mt list, fam hx and immunization status , as well as social and family history   See HPI Labs ordered Flu shot given Enc strongly to quit smoking  Pt plans to schedule her own mammogram  Discussed the shingrix vaccine- she will have to check on coverage of this  Enc strongly to eat low cholesterol diet

## 2019-10-09 NOTE — Assessment & Plan Note (Signed)
Enc pt strongly to schedule her mammogram  Enc self exams  No changes on exam today

## 2019-10-09 NOTE — Assessment & Plan Note (Signed)
Disc in detail risks of smoking and possible outcomes including copd, vascular/ heart disease, cancer , respiratory and sinus infections  Pt voices understanding She does not feel ready to quit at this time Offered help when she is ready

## 2019-10-09 NOTE — Assessment & Plan Note (Signed)
bp in fair control at this time  BP Readings from Last 1 Encounters:  10/07/19 126/78   No changes needed Most recent labs reviewed  Disc lifstyle change with low sodium diet and exercise

## 2019-10-09 NOTE — Assessment & Plan Note (Signed)
Disc goals for lipids and reasons to control them Rev last labs with pt Rev low sat fat diet in detail Has not tolerated even low dose/intermittent statins in the past  Lab today

## 2020-02-12 ENCOUNTER — Ambulatory Visit: Payer: BC Managed Care – PPO | Attending: Internal Medicine

## 2020-02-12 DIAGNOSIS — Z23 Encounter for immunization: Secondary | ICD-10-CM

## 2020-02-12 NOTE — Progress Notes (Signed)
   Covid-19 Vaccination Clinic  Name:  Carolyn Cordova    MRN: [id number] DOB: 02-13-1961  02/12/2020  [name] was observed post Covid-19 immunization for 15 minutes without incident. She was provided with Vaccine Information Sheet and instruction to access the V-Safe system.   [name] was instructed to call 911 with any severe reactions post vaccine: Marland Kitchen Difficulty breathing  . Swelling of face and throat  . A fast heartbeat  . A bad rash all over body  . Dizziness and weakness   Immunizations Administered    Name Date Dose VIS Date Route   Pfizer COVID-19 Vaccine 02/12/2020 10:09 AM 0.3 mL 10/28/2019 Intramuscular   Manufacturer: [place]   Lot: [id number]   [place]: [id number]

## 2020-03-07 ENCOUNTER — Ambulatory Visit: Payer: BC Managed Care – PPO | Attending: Internal Medicine

## 2020-03-07 DIAGNOSIS — Z23 Encounter for immunization: Secondary | ICD-10-CM

## 2020-03-07 NOTE — Progress Notes (Signed)
   Covid-19 Vaccination Clinic  Name:  [name]    MRN: [id number] DOB: 25-Dec-1960  03/07/2020  [name] was observed post Covid-19 immunization for 15 minutes without incident. She was provided with Vaccine Information Sheet and instruction to access the V-Safe system.   [name] was instructed to call 911 with any severe reactions post vaccine: Marland Kitchen Difficulty breathing  . Swelling of face and throat  . A fast heartbeat  . A bad rash all over body  . Dizziness and weakness   Immunizations Administered    Name Date Dose VIS Date Route   Pfizer COVID-19 Vaccine 03/07/2020  9:33 AM 0.3 mL 01/11/2019 Intramuscular   Manufacturer: [place]   Lot: [id number]   [place]: [id number]

## 2020-11-16 ENCOUNTER — Other Ambulatory Visit: Payer: Self-pay | Admitting: Family Medicine

## 2020-11-16 DIAGNOSIS — Z20822 Contact with and (suspected) exposure to covid-19: Secondary | ICD-10-CM | POA: Diagnosis not present

## 2021-01-07 ENCOUNTER — Other Ambulatory Visit: Payer: Self-pay | Admitting: Family Medicine

## 2021-01-07 NOTE — Telephone Encounter (Signed)
Med refilled once  

## 2021-01-07 NOTE — Telephone Encounter (Signed)
Pt hasn't been seen since 2020, please schedule a CPE or at least a f/u and then route back to me to fill med, thanks

## 2021-01-07 NOTE — Telephone Encounter (Signed)
Patient scheduled cpx on 02/07/21.

## 2021-02-07 ENCOUNTER — Encounter: Payer: BC Managed Care – PPO | Admitting: Family Medicine

## 2021-02-14 ENCOUNTER — Other Ambulatory Visit: Payer: Self-pay | Admitting: Family Medicine

## 2021-03-13 ENCOUNTER — Other Ambulatory Visit: Payer: Self-pay

## 2021-03-13 ENCOUNTER — Ambulatory Visit (INDEPENDENT_AMBULATORY_CARE_PROVIDER_SITE_OTHER): Payer: BC Managed Care – PPO | Admitting: Family Medicine

## 2021-03-13 ENCOUNTER — Encounter: Payer: Self-pay | Admitting: Family Medicine

## 2021-03-13 VITALS — BP 136/84 | HR 78 | Temp 97.2°F | Wt 146.1 lb

## 2021-03-13 DIAGNOSIS — R682 Dry mouth, unspecified: Secondary | ICD-10-CM | POA: Diagnosis not present

## 2021-03-13 DIAGNOSIS — G720 Drug-induced myopathy: Secondary | ICD-10-CM

## 2021-03-13 DIAGNOSIS — E7800 Pure hypercholesterolemia, unspecified: Secondary | ICD-10-CM | POA: Diagnosis not present

## 2021-03-13 DIAGNOSIS — F17200 Nicotine dependence, unspecified, uncomplicated: Secondary | ICD-10-CM | POA: Diagnosis not present

## 2021-03-13 DIAGNOSIS — I10 Essential (primary) hypertension: Secondary | ICD-10-CM | POA: Diagnosis not present

## 2021-03-13 DIAGNOSIS — Z0000 Encounter for general adult medical examination without abnormal findings: Secondary | ICD-10-CM | POA: Diagnosis not present

## 2021-03-13 DIAGNOSIS — T466X5A Adverse effect of antihyperlipidemic and antiarteriosclerotic drugs, initial encounter: Secondary | ICD-10-CM

## 2021-03-13 LAB — CBC WITH DIFFERENTIAL/PLATELET
Basophils Absolute: 0.1 10*3/uL (ref 0.0–0.1)
Basophils Relative: 1.3 % (ref 0.0–3.0)
Eosinophils Absolute: 0.2 10*3/uL (ref 0.0–0.7)
Eosinophils Relative: 4.6 % (ref 0.0–5.0)
HCT: 45 % (ref 36.0–46.0)
Hemoglobin: 15 g/dL (ref 12.0–15.0)
Lymphocytes Relative: 40.8 % (ref 12.0–46.0)
Lymphs Abs: 1.8 10*3/uL (ref 0.7–4.0)
MCHC: 33.3 g/dL (ref 30.0–36.0)
MCV: 92 fl (ref 78.0–100.0)
Monocytes Absolute: 0.3 10*3/uL (ref 0.1–1.0)
Monocytes Relative: 7.5 % (ref 3.0–12.0)
Neutro Abs: 2 10*3/uL (ref 1.4–7.7)
Neutrophils Relative %: 45.8 % (ref 43.0–77.0)
Platelets: 258 10*3/uL (ref 150.0–400.0)
RBC: 4.9 Mil/uL (ref 3.87–5.11)
RDW: 13.1 % (ref 11.5–15.5)
WBC: 4.3 10*3/uL (ref 4.0–10.5)

## 2021-03-13 LAB — COMPREHENSIVE METABOLIC PANEL
ALT: 14 U/L (ref 0–35)
AST: 13 U/L (ref 0–37)
Albumin: 4.4 g/dL (ref 3.5–5.2)
Alkaline Phosphatase: 77 U/L (ref 39–117)
BUN: 11 mg/dL (ref 6–23)
CO2: 27 mEq/L (ref 19–32)
Calcium: 9.4 mg/dL (ref 8.4–10.5)
Chloride: 103 mEq/L (ref 96–112)
Creatinine, Ser: 0.72 mg/dL (ref 0.40–1.20)
GFR: 90.94 mL/min (ref >60.00)
Glucose, Bld: 86 mg/dL (ref 70–99)
Potassium: 4.1 mEq/L (ref 3.5–5.1)
Sodium: 139 mEq/L (ref 135–145)
Total Bilirubin: 0.7 mg/dL (ref 0.2–1.2)
Total Protein: 6.5 g/dL (ref 6.0–8.3)

## 2021-03-13 LAB — TSH: TSH: 1.04 u[IU]/mL (ref 0.35–4.50)

## 2021-03-13 LAB — LIPID PANEL
Cholesterol: 251 mg/dL — ABNORMAL HIGH (ref 0–200)
HDL: 51.9 mg/dL (ref >39.00)
LDL Cholesterol: 176 mg/dL — ABNORMAL HIGH (ref 0–99)
NonHDL: 198.89
Total CHOL/HDL Ratio: 5
Triglycerides: 113 mg/dL (ref 0.0–149.0)
VLDL: 22.6 mg/dL (ref 0.0–40.0)

## 2021-03-13 MED ORDER — CEVIMELINE HCL 30 MG PO CAPS: ORAL_CAPSULE | ORAL | 11 refills | Status: DC

## 2021-03-13 NOTE — Assessment & Plan Note (Signed)
Disc goals for lipids and reasons to control them Rev last labs with pt Rev low sat fat diet in detail Due for labs  Intolerant of statins /even intermittent  Watching diet  Labs today Inc CVD risk in combination with smoking

## 2021-03-13 NOTE — Assessment & Plan Note (Signed)
Reviewed health habits including diet and exercise and skin cancer prevention Reviewed appropriate screening tests for age  Also reviewed health mt list, fam hx and immunization status , as well as social and family history   See HPI Labs ordered  Enc pt to consider smoking cessation  Pap and colonoscopy utd  Mammogram overdue-given pt materials to schedule this (has family hx as well) Encouraged a covid booster  Pt is interested in shingrix and will check on coverage

## 2021-03-13 NOTE — Assessment & Plan Note (Signed)
Intolerant of statins even intermittent low dose

## 2021-03-13 NOTE — Progress Notes (Signed)
Subjective:    Patient ID: Carolyn Cordova, female    DOB: 08-17-1961, 60 y.o.   MRN: [id number]  This visit occurred during the SARS-CoV-2 public health emergency.  Safety protocols were in place, including screening questions prior to the visit, additional usage of staff PPE, and extensive cleaning of exam room while observing appropriate contact time as indicated for disinfecting solutions.    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 with 2 dogs/ planning some more school  Husband -limited mobility /frustrated   Not a lot of time for self care   Pap 10/18 neg with neg hpv  No new partners No gyn c/o and not active    Mammogram- needs appt for that /missed during covid   Self breast exam -no lumps  Mother had breast cancer Cousin had breast cancer  covid status imm 2, and had covid 3/22 (whole family) - body aches and fever  Tdap 8/14 Zoster status -interested in vaccine   Colonoscopy 11/19 with 5 y recall   Smoking status - about the same  Would like to quit/ not ready   HTN bp is stable today  No cp or palpitations or headaches or edema  No side effects to medicines  BP Readings from Last 3 Encounters:  03/13/21 136/84  10/07/19 126/78  09/29/18 129/66    Taking amlodipine 5 mg daily   Hyperlipidemia ntolerant of statins Even low intermittent Watches diet  Eating chicken and less fatty meat Some rich foods at home  Avoiding fried foods    ASVD risk is 11.4% for 10 y based on last labs  Patient Active Problem List   Diagnosis Date Noted  . Screening mammogram, encounter for 09/02/2017  . Stress reaction 07/30/2016  . Colon cancer screening 04/10/2015  . Internal hemorrhoid 04/10/2015  . Encounter for  routine gynecological examination 03/17/2014  . Routine general medical examination at a health care facility 03/09/2014  . H/O herpes zoster 08/09/2012  . Dry mouth 04/20/2012  . Elevated rheumatoid factor 04/20/2012  . Atrophic vaginitis 10/01/2011  . HERNIATED CERVICAL DISC 09/14/2009  . PURE HYPERCHOLESTEROLEMIA 05/10/2009  . TOBACCO ABUSE 08/19/2007  . CARPAL TUNNEL SYNDROME, BILATERAL 08/19/2007  . Essential hypertension 08/19/2007  . HEMORRHOIDS 08/19/2007  . History of cervical dysplasia 08/19/2007  . MIGRAINES, HX OF 08/19/2007   Past Medical History:  Diagnosis Date  . Allergy    seasonal  . Carpal tunnel syndrome   . Cervical disc disease   . GERD (gastroesophageal reflux disease)   . Hyperlipidemia   . Hypertension   . Migraine   . PMDD (premenstrual dysphoric disorder)    Past Surgical History:  Procedure Laterality Date  . CESAREAN SECTION    . COLONOSCOPY    . CRYOABLATION  11/90   secondary to dysplasia  . lipoma removal  2006  . PILONIDAL CYST EXCISION  1980s   x2  . plantar fasciitis  8/12   surgery with Dr Milinda Pointer  . POLYPECTOMY    . SPINE SURGERY  03/11   cervical spine surgery   Social History   Tobacco Use  . Smoking status: Current Every Day Smoker    Packs/day: 1.00  Types: Cigarettes  . Smokeless tobacco: Never Used  Vaping Use  . Vaping Use: Never used  Substance Use Topics  . Alcohol use: No    Alcohol/week: 0.0 standard drinks  . Drug use: No   Family History  Problem Relation Age of Onset  . Hyperlipidemia Mother   . Cancer Mother        breast CA  . Breast cancer Mother   . Cancer Father        adrenal cancer  . Colon polyps Father   . Hyperlipidemia Father   . Hyperlipidemia Brother   . Cancer Cousin        breast CA  . Colon cancer Neg Hx   . Esophageal cancer Neg Hx   . Rectal cancer Neg Hx   . Stomach cancer Neg Hx    Allergies  Allergen Reactions  . Adhesive [Tape]   . Amoxicillin-Pot Clavulanate      REACTION: rash  . Codeine     REACTION: ? reaction  . Fexofenadine     REACTION: nausea  . Sertraline Hcl     REACTION: sedation  . Varenicline Tartrate     REACTION: made her feel sick and did not work  . Wellbutrin [Bupropion] Other (See Comments)   Current Outpatient Medications on File Prior to Visit  Medication Sig Dispense Refill  . amLODipine (NORVASC) 5 MG tablet TAKE 1 TABLET BY MOUTH ONCE A DAY 90 tablet 1  . aspirin 81 MG tablet Take 81 mg by mouth daily.    . cetirizine (ZYRTEC) 10 MG tablet Take 10 mg by mouth daily.    . fluticasone (FLONASE) 50 MCG/ACT nasal spray Place 2 sprays into both nostrils daily as needed for allergies or rhinitis.     No current facility-administered medications on file prior to visit.     Review of Systems  Constitutional: Positive for fatigue. Negative for activity change, appetite change, fever and unexpected weight change.  HENT: Negative for congestion, ear pain, rhinorrhea, sinus pressure and sore throat.        Dry mouth  Eyes: Negative for pain, redness and visual disturbance.  Respiratory: Negative for cough, shortness of breath and wheezing.   Cardiovascular: Negative for chest pain and palpitations.  Gastrointestinal: Negative for abdominal pain, blood in stool, constipation and diarrhea.  Endocrine: Negative for polydipsia and polyuria.  Genitourinary: Negative for dysuria, frequency and urgency.  Musculoskeletal: Negative for arthralgias, back pain and myalgias.  Skin: Negative for pallor and rash.  Allergic/Immunologic: Negative for environmental allergies.  Neurological: Negative for dizziness, syncope and headaches.  Hematological: Negative for adenopathy. Does not bruise/bleed easily.  Psychiatric/Behavioral: Negative for decreased concentration and dysphoric mood. The patient is not nervous/anxious.        Stressors       Objective:   Physical Exam Constitutional:      General: She is not in acute distress.     Appearance: Normal appearance. She is well-developed and normal weight. She is not ill-appearing or diaphoretic.  HENT:     Head: Normocephalic and atraumatic.     Right Ear: Tympanic membrane, ear canal and external ear normal.     Left Ear: Tympanic membrane, ear canal and external ear normal.     Nose: Nose normal. No congestion.     Mouth/Throat:     Mouth: Mucous membranes are moist.     Pharynx: Oropharynx is clear. No posterior oropharyngeal erythema.  Eyes:     General: No scleral icterus.  Extraocular Movements: Extraocular movements intact.     Conjunctiva/sclera: Conjunctivae normal.     Pupils: Pupils are equal, round, and reactive to light.  Neck:     Thyroid: No thyromegaly.     Vascular: No carotid bruit or JVD.  Cardiovascular:     Rate and Rhythm: Normal rate and regular rhythm.     Pulses: Normal pulses.     Heart sounds: Normal heart sounds. No gallop.   Pulmonary:     Effort: Pulmonary effort is normal. No respiratory distress.     Breath sounds: Normal breath sounds. No wheezing.     Comments: bs are mildly distant  Chest:     Chest wall: No tenderness.  Abdominal:     General: Bowel sounds are normal. There is no distension or abdominal bruit.     Palpations: Abdomen is soft. There is no mass.     Tenderness: There is no abdominal tenderness.     Hernia: No hernia is present.  Genitourinary:    Comments: Breast exam: No mass, nodules, thickening, tenderness, bulging, retraction, inflamation, nipple discharge or skin changes noted.  No axillary or clavicular LA.     Musculoskeletal:        General: No tenderness. Normal range of motion.     Cervical back: Normal range of motion and neck supple. No rigidity. No muscular tenderness.     Right lower leg: No edema.     Left lower leg: No edema.     Comments: No kyphosis   Lymphadenopathy:     Cervical: No cervical adenopathy.  Skin:    General: Skin is warm and dry.     Coloration: Skin is not pale.      Findings: No erythema or rash.     Comments: Solar lentigines diffusely  tanned  Neurological:     Mental Status: She is alert. Mental status is at baseline.     Cranial Nerves: No cranial nerve deficit.     Motor: No abnormal muscle tone.     Coordination: Coordination normal.     Gait: Gait normal.     Deep Tendon Reflexes: Reflexes are normal and symmetric. Reflexes normal.  Psychiatric:        Mood and Affect: Mood normal.        Cognition and Memory: Cognition and memory normal.           Assessment & Plan:   Problem List Items Addressed This Visit      Cardiovascular and Mediastinum   Essential hypertension    bp in fair control at this time  BP Readings from Last 1 Encounters:  03/13/21 136/84   No changes needed Most recent labs reviewed  Disc lifstyle change with low sodium diet and exercise  Plan to continue amlodipine 5 mg daily      Relevant Orders   CBC with Differential/Platelet (Completed)   Comprehensive metabolic panel (Completed)   Lipid panel (Completed)   TSH (Completed)     Digestive   Dry mouth    Plan to continue cevimeline tid         Musculoskeletal and Integument   Statin myopathy    Intolerant of statins even intermittent low dose        Other   PURE HYPERCHOLESTEROLEMIA    Disc goals for lipids and reasons to control them Rev last labs with pt Rev low sat fat diet in detail Due for labs  Intolerant of statins /even intermittent  Watching diet  Labs today Inc CVD risk in combination with smoking      Relevant Orders   Lipid panel (Completed)   TOBACCO ABUSE    Disc in detail risks of smoking and possible outcomes including copd, vascular/ heart disease, cancer , respiratory and sinus infections  Pt voices understanding Rev ASVD risk with smoking  Pt is not ready to quit at this time       Routine general medical examination at a health care facility - Primary    Reviewed health habits including diet and exercise  and skin cancer prevention Reviewed appropriate screening tests for age  Also reviewed health mt list, fam hx and immunization status , as well as social and family history   See HPI Labs ordered  Enc pt to consider smoking cessation  Pap and colonoscopy utd  Mammogram overdue-given pt materials to schedule this (has family hx as well) Encouraged a covid booster  Pt is interested in shingrix and will check on coverage

## 2021-03-13 NOTE — Patient Instructions (Addendum)
Please make your mammogram appointment   I do recommend a covid booster when you can get one   If you are interested in the shingles vaccine series (Shingrix), call your insurance or pharmacy to check on coverage and location it must be given.  If affordable - you can schedule it here or at your pharmacy depending on coverage    Labs today  Take care of yourself   Keep thinking about quitting smoking

## 2021-03-13 NOTE — Assessment & Plan Note (Signed)
Disc in detail risks of smoking and possible outcomes including copd, vascular/ heart disease, cancer , respiratory and sinus infections  Pt voices understanding Rev ASVD risk with smoking  Pt is not ready to quit at this time

## 2021-03-13 NOTE — Assessment & Plan Note (Signed)
bp in fair control at this time  BP Readings from Last 1 Encounters:  03/13/21 136/84   No changes needed Most recent labs reviewed  Disc lifstyle change with low sodium diet and exercise  Plan to continue amlodipine 5 mg daily

## 2021-03-13 NOTE — Assessment & Plan Note (Signed)
Plan to continue cevimeline tid

## 2021-03-14 ENCOUNTER — Telehealth: Payer: Self-pay | Admitting: *Deleted

## 2021-03-14 ENCOUNTER — Encounter: Payer: Self-pay | Admitting: Family Medicine

## 2021-03-14 DIAGNOSIS — E7800 Pure hypercholesterolemia, unspecified: Secondary | ICD-10-CM

## 2021-03-14 NOTE — Telephone Encounter (Signed)
Comments released on mychart and also Left VM requesting pt to call the office back

## 2021-03-14 NOTE — Telephone Encounter (Signed)
-----   Message from Abner Greenspan, MD sent at 03/13/2021  8:55 PM EDT ----- Cholesterol is quite high with LDL of 176  I would like her to see cardiology/ lipid specialist to discuss treatment options since she does not tolerate statins Let me know if agreeable

## 2021-03-15 NOTE — Telephone Encounter (Signed)
Pt is agreeable to see to see cardiology/ lipid specialist

## 2021-04-27 DIAGNOSIS — Z1231 Encounter for screening mammogram for malignant neoplasm of breast: Secondary | ICD-10-CM | POA: Diagnosis not present

## 2021-07-17 ENCOUNTER — Encounter (HOSPITAL_BASED_OUTPATIENT_CLINIC_OR_DEPARTMENT_OTHER): Payer: Self-pay | Admitting: Internal Medicine

## 2021-07-17 ENCOUNTER — Other Ambulatory Visit: Payer: Self-pay

## 2021-07-17 ENCOUNTER — Ambulatory Visit (INDEPENDENT_AMBULATORY_CARE_PROVIDER_SITE_OTHER): Payer: BC Managed Care – PPO | Admitting: Internal Medicine

## 2021-07-17 VITALS — BP 120/84 | HR 80 | Ht 68.0 in | Wt 140.2 lb

## 2021-07-17 DIAGNOSIS — E7800 Pure hypercholesterolemia, unspecified: Secondary | ICD-10-CM

## 2021-07-17 DIAGNOSIS — T466X5A Adverse effect of antihyperlipidemic and antiarteriosclerotic drugs, initial encounter: Secondary | ICD-10-CM

## 2021-07-17 DIAGNOSIS — M791 Myalgia, unspecified site: Secondary | ICD-10-CM

## 2021-07-17 NOTE — Patient Instructions (Signed)
Medication Instructions:  NO CHANGES until test results   Lab Work: FASTING lab work before your next visit -- complete at [facility] about 1 week before the appointment with Dr. Debara Pickett   If you have labs (blood work) drawn today and your tests are completely normal, you will receive your results only by: MyChart Message (if you have [facility]) OR A paper copy in the mail If you have any lab test that is abnormal or we need to change your treatment, we will call you to review the results.   Testing/Procedures: Dr. Debara Pickett has ordered a CT coronary calcium score. This test is done at [address]. This is $99 out of pocket.   Coronary CalciumScan A coronary calcium scan is an imaging test used to look for deposits of calcium and other fatty materials (plaques) in the inner lining of the blood vessels of the heart (coronary arteries). These deposits of calcium and plaques can partly clog and narrow the coronary arteries without producing any symptoms or warning signs. This puts a person at risk for a heart attack. This test can detect these deposits before symptoms develop. Tell a health care provider about: Any allergies you have. All medicines you are taking, including vitamins, herbs, eye drops, creams, and over-the-counter medicines. Any problems you or family members have had with anesthetic medicines. Any blood disorders you have. Any surgeries you have had. Any medical conditions you have. Whether you are pregnant or may be pregnant. What are the risks? Generally, this is a safe procedure. However, problems may occur, including: Harm to a pregnant woman and her unborn baby. This test involves the use of radiation. Radiation exposure can be dangerous to a pregnant woman and her unborn baby. If you are pregnant, you generally should not have this procedure done. Slight increase in the risk of cancer. This is because of the radiation involved in the test. What happens  before the procedure? No preparation is needed for this procedure. What happens during the procedure? You will undress and remove any jewelry around your neck or chest. You will put on a hospital gown. Sticky electrodes will be placed on your chest. The electrodes will be connected to an electrocardiogram (ECG) machine to record a tracing of the electrical activity of your heart. A CT scanner will take pictures of your heart. During this time, you will be asked to lie still and hold your breath for 2-3 seconds while a picture of your heart is being taken. The procedure may vary among health care providers and hospitals. What happens after the procedure? You can get dressed. You can return to your normal activities. It is up to you to get the results of your test. Ask your health care provider, or the department that is doing the test, when your results will be ready. Summary A coronary calcium scan is an imaging test used to look for deposits of calcium and other fatty materials (plaques) in the inner lining of the blood vessels of the heart (coronary arteries). Generally, this is a safe procedure. Tell your health care provider if you are pregnant or may be pregnant. No preparation is needed for this procedure. A CT scanner will take pictures of your heart. You can return to your normal activities after the scan is done. This information is not intended to replace advice given to you by your health care provider. Make sure you discuss any questions you have with your health care provider. Document Released:  05/01/2008 Document Revised: 09/22/2016 Document Reviewed: 09/22/2016 Elsevier Interactive Patient Education  2017 [place]: At [facility], you and your health needs are our priority.  As part of our continuing mission to provide you with exceptional heart care, we have created designated Provider Care Teams.  These Care Teams include your primary Cardiologist  (physician) and Advanced Practice Providers (APPs -  Physician Assistants and Nurse Practitioners) who all work together to provide you with the care you need, when you need it.  We recommend signing up for the patient portal called "MyChart".  Sign up information is provided on this After Visit Summary.  MyChart is used to connect with patients for Virtual Visits (Telemedicine).  Patients are able to view lab/test results, encounter notes, upcoming appointments, etc.  Non-urgent messages can be sent to your provider as well.   To learn more about what you can do with MyChart, go to [email or web address].    Your next appointment:   4 month(s) - lipid clinic  The format for your next appointment:   In Person  Provider:   K. Mali Hilty, MD   Other Instructions

## 2021-07-17 NOTE — Progress Notes (Signed)
LIPID CLINIC CONSULT NOTE  Chief Complaint:  Manage dyslipidemia  Primary Care Physician: Tower, Wynelle Fanny, MD  Primary Cardiologist:  None  HPI:  Carolyn Cordova is a 60 y.o. female who is being seen today for the evaluation of dyslipidemia at the request of Tower, Wynelle Fanny, MD. this is a pleasant 60 year old female kindly referred for evaluation management of dyslipidemia.  Discussed that has a history of heart disease in the family including her father who had sudden cardiac death in his 15s but had a prior cardiovascular event that was unrecognized.  Her mother has a history of hypertension.  Her only risk factors include hypertension and age.  She does take low-dose aspirin more as a preventative.  Most recently in April 22 her total cholesterol was 251, HDL 52, triglycerides 113 and LDL 176.  Her LDL cholesterol had been as low as the 90s in the past probably on a statin however she did not tolerate it.  She has had myalgias mostly with statins including rosuvastatin, atorvastatin and possibly others.  She says she took brand-name Crestor at one point and seemed to tolerate that better than rosuvastatin generic.  Diet is somewhat over the place right now.  She is caring for her husband at this time who is struggling with some health issues.  PMHx:  Past Medical History:  Diagnosis Date   Allergy    seasonal   Carpal tunnel syndrome    Cervical disc disease    GERD (gastroesophageal reflux disease)    Hyperlipidemia    Hypertension    Migraine    PMDD (premenstrual dysphoric disorder)     Past Surgical History:  Procedure Laterality Date   CESAREAN SECTION     COLONOSCOPY     CRYOABLATION  11/90   secondary to dysplasia   lipoma removal  2006   PILONIDAL CYST EXCISION  1980s   x2   plantar fasciitis  8/12   surgery with Dr Milinda Pointer   POLYPECTOMY     SPINE SURGERY  03/11   cervical spine surgery    FAMHx:  Family History  Problem Relation Age of Onset    Hyperlipidemia Mother    Cancer Mother        breast CA   Breast cancer Mother    Cancer Father        adrenal cancer   Colon polyps Father    Hyperlipidemia Father    Hyperlipidemia Brother    Cancer Cousin        breast CA   Colon cancer Neg Hx    Esophageal cancer Neg Hx    Rectal cancer Neg Hx    Stomach cancer Neg Hx     SOCHx:   reports that she has been smoking cigarettes. She has been smoking an average of 1 pack per day. She has never used smokeless tobacco. She reports that she does not drink alcohol and does not use drugs.  ALLERGIES:  Allergies  Allergen Reactions   Adhesive [Tape]    Amoxicillin-Pot Clavulanate     REACTION: rash   Codeine     REACTION: ? reaction   Fexofenadine     REACTION: nausea   Sertraline Hcl     REACTION: sedation   Varenicline Tartrate     REACTION: made her feel sick and did not work   Wellbutrin [Bupropion] Other (See Comments)    ROS: Pertinent items noted in HPI and remainder of comprehensive ROS otherwise negative.  HOME  MEDS: Current Outpatient Medications on File Prior to Visit  Medication Sig Dispense Refill   amLODipine (NORVASC) 5 MG tablet TAKE 1 TABLET BY MOUTH ONCE A DAY 90 tablet 1   aspirin 81 MG tablet Take 81 mg by mouth daily.     cetirizine (ZYRTEC) 10 MG tablet Take 10 mg by mouth daily.     cevimeline (EVOXAC) 30 MG capsule TAKE 1 CAPSULE BY MOUTH 3 TIMES A DAY 90 capsule 11   fluticasone (FLONASE) 50 MCG/ACT nasal spray Place 2 sprays into both nostrils daily as needed for allergies or rhinitis.     No current facility-administered medications on file prior to visit.    LABS/IMAGING: No results found for this or any previous visit (from the past 48 hour(s)). No results found.  LIPID PANEL:    Component Value Date/Time   CHOL 251 (H) 03/13/2021 0902   TRIG 113.0 03/13/2021 0902   HDL 51.90 03/13/2021 0902   CHOLHDL 5 03/13/2021 0902   VLDL 22.6 03/13/2021 0902   LDLCALC 176 (H) 03/13/2021 0902    LDLCALC 167 (H) 10/07/2019 1554   LDLDIRECT 150.0 08/05/2017 1719    WEIGHTS: Wt Readings from Last 3 Encounters:  07/17/21 140 lb 3.2 oz (63.6 kg)  03/13/21 146 lb 2 oz (66.3 kg)  10/07/19 142 lb 6 oz (64.6 kg)    VITALS: BP 120/84   Pulse 80   Ht '5\' 8"'$  (1.727 m)   Wt 140 lb 3.2 oz (63.6 kg)   SpO2 99%   BMI 21.32 kg/m   EXAM: General appearance: alert and no distress Neck: no carotid bruit, no JVD, and thyroid not enlarged, symmetric, no tenderness/mass/nodules Lungs: clear to auscultation bilaterally Heart: regular rate and rhythm, S1, S2 normal, no murmur, click, rub or gallop Abdomen: soft, non-tender; bowel sounds normal; no masses,  no organomegaly Extremities: extremities normal, atraumatic, no cyanosis or edema Pulses: 2+ and symmetric Skin: Skin color, texture, turgor normal. No rashes or lesions Neurologic: Grossly normal Psych: Pleasant  *Examination chaperoned by Sheral Apley, RN.  EKG: N/A  ASSESSMENT: Mixed dyslipidemia Statin intolerance-myalgias Family history of premature coronary disease and high cholesterol  PLAN: 1.   Ms. Vilches has a mixed dyslipidemia with a high LDL cholesterol.  Its not clear whether she has been over 190 previously but could have a possible familial hyperlipidemia.  Her father apparently had high cholesterol and early onset heart disease.  She has been intolerant to statins.  Options however may be limited given few cardiovascular risk factors.  It would be helpful to further risk ratified and I am recommending a coronary calcium score.  If that is abnormal certainly she might be a candidate for advanced therapies such as a PCSK9 inhibitor or we may consider ezetimibe and perhaps a retrial of low-dose rosuvastatin since she did tolerate that at one point.  Thanks again for the kind referral.  We will follow-up after her calcium score.  Pixie Casino, MD, [facility], [place] Director of the  Advanced Lipid Disorders &  Cardiovascular Risk Reduction Clinic Diplomate of the American Board of Clinical Lipidology Attending Cardiologist  Direct Dial: [phone]  Fax: [phone]  Website:  [email or web address]  Nadean Corwin Theus Espin 07/17/2021, 1:21 PM

## 2021-08-06 ENCOUNTER — Ambulatory Visit: Payer: BC Managed Care – PPO

## 2021-08-07 ENCOUNTER — Telehealth (HOSPITAL_BASED_OUTPATIENT_CLINIC_OR_DEPARTMENT_OTHER): Payer: Self-pay | Admitting: Internal Medicine

## 2021-08-07 NOTE — Telephone Encounter (Signed)
Spoke with patient regarding 08/28/21 8:00 am CT calcium scoring scheduled at [facility] time is 7:45 am at the radiology department---patient voiced her understanding.--

## 2021-08-19 ENCOUNTER — Other Ambulatory Visit: Payer: Self-pay | Admitting: Family Medicine

## 2021-08-28 ENCOUNTER — Other Ambulatory Visit: Payer: Self-pay

## 2021-08-28 ENCOUNTER — Ambulatory Visit
Admission: RE | Admit: 2021-08-28 | Discharge: 2021-08-28 | Disposition: A | Discharge status: Home / Self Care | Payer: BC Managed Care – PPO | Source: Ambulatory Visit | Attending: Internal Medicine | Admitting: Internal Medicine

## 2021-08-28 DIAGNOSIS — E7800 Pure hypercholesterolemia, unspecified: Secondary | ICD-10-CM | POA: Insufficient documentation

## 2021-08-29 ENCOUNTER — Telehealth: Payer: Self-pay

## 2021-08-29 DIAGNOSIS — E7800 Pure hypercholesterolemia, unspecified: Secondary | ICD-10-CM

## 2021-08-29 NOTE — Telephone Encounter (Signed)
Pt returning calling about results. Pt made aware of results. Will route to Dr. Lysbeth Penner nurse for further intervention.

## 2021-08-29 NOTE — Telephone Encounter (Signed)
Spoke with patient about CT calcium score results, explained PCSK9i, rationale for prescribing, possible side effects, administration/dosing. Sent MyChart message with info  Will work on PA for medication and let patient know when approved

## 2021-08-29 NOTE — Telephone Encounter (Signed)
PA for repatha sureclick submitted via CMM (Key: [id number])

## 2021-09-03 NOTE — Telephone Encounter (Signed)
Patient denied coverage of Repatha  Per [employer] - Repatha is approved to treat high cholesterol, ASCVD, when the member is over age 61 and has certain conditions dedined within the criteria (such as heart attack or stroke).   Per plan criteria, patient does not have certain conditions to confirm diagnosis of ASCVD

## 2021-09-03 NOTE — Telephone Encounter (Signed)
Appeals letter composed. Will be faxed to [facility] with MD note, calcium score test, and member representation form, once received back from patient.

## 2021-09-06 ENCOUNTER — Ambulatory Visit (INDEPENDENT_AMBULATORY_CARE_PROVIDER_SITE_OTHER): Payer: BC Managed Care – PPO | Admitting: Family Medicine

## 2021-09-06 ENCOUNTER — Encounter: Payer: Self-pay | Admitting: Family Medicine

## 2021-09-06 ENCOUNTER — Other Ambulatory Visit: Payer: Self-pay

## 2021-09-06 VITALS — BP 122/76 | HR 50 | Temp 99.2°F | Ht 68.0 in | Wt 140.0 lb

## 2021-09-06 DIAGNOSIS — N3001 Acute cystitis with hematuria: Secondary | ICD-10-CM | POA: Diagnosis not present

## 2021-09-06 DIAGNOSIS — R7303 Prediabetes: Secondary | ICD-10-CM | POA: Insufficient documentation

## 2021-09-06 DIAGNOSIS — R7309 Other abnormal glucose: Secondary | ICD-10-CM | POA: Insufficient documentation

## 2021-09-06 DIAGNOSIS — R300 Dysuria: Secondary | ICD-10-CM

## 2021-09-06 DIAGNOSIS — R81 Glycosuria: Secondary | ICD-10-CM | POA: Diagnosis not present

## 2021-09-06 LAB — POC URINALSYSI DIPSTICK (AUTOMATED)
Glucose, UA: POSITIVE — AB
Ketones, UA: NEGATIVE
Nitrite, UA: POSITIVE
Protein, UA: POSITIVE — AB
Spec Grav, UA: <=1.005 — AB (ref 1.010–1.025)
Urobilinogen, UA: 2 E.U./dL — AB
pH, UA: 6 (ref 5.0–8.0)

## 2021-09-06 LAB — POCT GLYCOSYLATED HEMOGLOBIN (HGB A1C): Hemoglobin A1C: 5.5 % (ref 4.0–5.6)

## 2021-09-06 MED ORDER — SULFAMETHOXAZOLE-TRIMETHOPRIM 800-160 MG PO TABS: 1.0000 | ORAL_TABLET | Freq: Two times a day (BID) | ORAL | 0 refills | Status: DC

## 2021-09-06 NOTE — Assessment & Plan Note (Signed)
Treat with fluids, antibiotics ( Bactrim x 3 days) and Azo prn.  Will send for culture to make sure bacterial susceptible to chosen antibiotic.

## 2021-09-06 NOTE — Assessment & Plan Note (Signed)
Likely inaccurate reading with  AZO in urine, but she reports stress eating high carbs lately.  Eval with A1C.

## 2021-09-06 NOTE — Progress Notes (Signed)
Patient ID: Carolyn Cordova, female    DOB: 11/17/1961, 60 y.o.   MRN: [id number]  This visit was conducted in person.  BP 122/76 (BP Location: Left Arm, Patient Position: Sitting, Cuff Size: Normal)   Pulse (!) 50   Temp 99.2 F (37.3 C) (Temporal)   Ht 5\' 8"  (1.727 m)   Wt 140 lb (63.5 kg)   SpO2 98%   BMI 21.29 kg/m    CC:  Chief Complaint  Patient presents with   Urinary Tract Infection    Patient has been having burning when urinates and pain in back and left side since today. She did take an Azo pill today but that did not help much.    Subjective:   HPI: Carolyn Cordova is a 60 y.o. female presenting on 09/06/2021 for Urinary Tract Infection (Patient has been having burning when urinates and pain in back and left side since today. She did take an Azo pill today but that did not help much.)  Urinary Tract Infection  This is a new problem. The current episode started yesterday. The problem occurs every urination. The problem has been gradually worsening. The quality of the pain is described as burning. The pain is moderate. There has been no fever. She is Not sexually active. There is No history of pyelonephritis. Associated symptoms include frequency, hematuria and nausea. Pertinent negatives include no chills, discharge, flank pain, hesitancy, urgency or vomiting. Associated symptoms comments: Low back pain. She has tried increased fluids (AZO) for the symptoms. The treatment provided mild relief. There is no history of catheterization, kidney stones, recurrent UTIs, a single kidney, urinary stasis or a urological procedure.      Urinalysis    Component Value Date/Time   COLORURINE YELLOW 12/28/2009 [place] 12/28/2009 1526   LABSPEC 1.023 12/28/2009 1526   PHURINE 6.5 12/28/2009 1526   GLUCOSEU NEGATIVE 12/28/2009 1526   HGBUR NEGATIVE 12/28/2009 1526   BILIRUBINUR 2+ 09/06/2021 1533   KETONESUR NEGATIVE 12/28/2009 1526   PROTEINUR Positive (A)  09/06/2021 1533   PROTEINUR NEGATIVE 12/28/2009 1526   UROBILINOGEN 2.0 (A) 09/06/2021 1533   UROBILINOGEN 0.2 12/28/2009 1526   NITRITE positive 09/06/2021 1533   NITRITE NEGATIVE 12/28/2009 1526   LEUKOCYTESUR Large (3+) (A) 09/06/2021 1533     Relevant past medical, surgical, family and social history reviewed and updated as indicated. Interim medical history since our last visit reviewed. Allergies and medications reviewed and updated. Outpatient Medications Prior to Visit  Medication Sig Dispense Refill   amLODipine (NORVASC) 5 MG tablet TAKE 1 TABLET BY MOUTH ONCE A DAY 90 tablet 1   aspirin 81 MG tablet Take 81 mg by mouth daily.     cetirizine (ZYRTEC) 10 MG tablet Take 10 mg by mouth daily.     cevimeline (EVOXAC) 30 MG capsule TAKE 1 CAPSULE BY MOUTH 3 TIMES A DAY 90 capsule 11   fluticasone (FLONASE) 50 MCG/ACT nasal spray Place 2 sprays into both nostrils daily as needed for allergies or rhinitis. (Patient not taking: Reported on 09/06/2021)     No facility-administered medications prior to visit.     Per HPI unless specifically indicated in ROS section below Review of Systems  Constitutional:  Negative for chills, fatigue and fever.  HENT:  Negative for congestion.   Eyes:  Negative for pain.  Respiratory:  Negative for cough and shortness of breath.   Cardiovascular:  Negative for chest pain, palpitations and leg swelling.  Gastrointestinal:  Positive for nausea. Negative for abdominal pain and vomiting.  Genitourinary:  Positive for frequency and hematuria. Negative for dysuria, flank pain, hesitancy, urgency and vaginal bleeding.  Musculoskeletal:  Negative for back pain.  Neurological:  Negative for syncope, light-headedness and headaches.  Psychiatric/Behavioral:  Negative for dysphoric mood.   Objective:  BP 122/76 (BP Location: Left Arm, Patient Position: Sitting, Cuff Size: Normal)   Pulse (!) 50   Temp 99.2 F (37.3 C) (Temporal)   Ht 5\' 8"  (1.727 m)    Wt 140 lb (63.5 kg)   SpO2 98%   BMI 21.29 kg/m   Wt Readings from Last 3 Encounters:  09/06/21 140 lb (63.5 kg)  07/17/21 140 lb 3.2 oz (63.6 kg)  03/13/21 146 lb 2 oz (66.3 kg)      Physical Exam Constitutional:      General: She is not in acute distress.    Appearance: Normal appearance. She is well-developed. She is not ill-appearing or toxic-appearing.  HENT:     Head: Normocephalic.     Right Ear: Hearing, tympanic membrane, ear canal and external ear normal. Tympanic membrane is not erythematous, retracted or bulging.     Left Ear: Hearing, tympanic membrane, ear canal and external ear normal. Tympanic membrane is not erythematous, retracted or bulging.     Nose: No mucosal edema or rhinorrhea.     Right Sinus: No maxillary sinus tenderness or frontal sinus tenderness.     Left Sinus: No maxillary sinus tenderness or frontal sinus tenderness.     Mouth/Throat:     Pharynx: Uvula midline.  Eyes:     General: Lids are normal. Lids are everted, no foreign bodies appreciated.     Conjunctiva/sclera: Conjunctivae normal.     Pupils: Pupils are equal, round, and reactive to light.  Neck:     Thyroid: No thyroid mass or thyromegaly.     Vascular: No carotid bruit.     Trachea: Trachea normal.  Cardiovascular:     Rate and Rhythm: Normal rate and regular rhythm.     Pulses: Normal pulses.     Heart sounds: Normal heart sounds, S1 normal and S2 normal. No murmur heard.   No friction rub. No gallop.  Pulmonary:     Effort: Pulmonary effort is normal. No tachypnea or respiratory distress.     Breath sounds: Normal breath sounds. No decreased breath sounds, wheezing, rhonchi or rales.  Abdominal:     General: Bowel sounds are normal.     Palpations: Abdomen is soft.     Tenderness: There is abdominal tenderness in the suprapubic area. There is no right CVA tenderness or left CVA tenderness.  Musculoskeletal:     Cervical back: Normal range of motion and neck supple.  Skin:     General: Skin is warm and dry.     Findings: No rash.  Neurological:     Mental Status: She is alert.  Psychiatric:        Mood and Affect: Mood is not anxious or depressed.        Speech: Speech normal.        Behavior: Behavior normal. Behavior is cooperative.        Thought Content: Thought content normal.        Judgment: Judgment normal.      Results for orders placed or performed in visit on 09/06/21  POCT Urinalysis Dipstick (Automated)  Result Value Ref Range   Color, UA amber    Clarity, UA clear  Glucose, UA Positive (A) Negative   Bilirubin, UA 2+    Ketones, UA neg    Spec Grav, UA <=1.005 (A) 1.010 - 1.025   Blood, UA 3+    pH, UA 6.0 5.0 - 8.0   Protein, UA Positive (A) Negative   Urobilinogen, UA 2.0 (A) 0.2 or 1.0 E.U./dL   Nitrite, UA positive    Leukocytes, UA Large (3+) (A) Negative    This visit occurred during the SARS-CoV-2 public health emergency.  Safety protocols were in place, including screening questions prior to the visit, additional usage of staff PPE, and extensive cleaning of exam room while observing appropriate contact time as indicated for disinfecting solutions.   COVID 19 screen:  No recent travel or known exposure to COVID19 The patient denies respiratory symptoms of COVID 19 at this time. The importance of social distancing was discussed today.   Assessment and Plan    Problem List Items Addressed This Visit     Acute cystitis with hematuria    Treat with fluids, antibiotics ( Bactrim x 3 days) and Azo prn.  Will send for culture to make sure bacterial susceptible to chosen antibiotic.       Glucose found in urine on examination    Likely inaccurate reading with  AZO in urine, but she reports stress eating high carbs lately.  Eval with A1C.      Relevant Orders   HgB A1c   Other Visit Diagnoses     Dysuria    -  Primary   Relevant Orders   POCT Urinalysis Dipstick (Automated) (Completed)   Urine Culture      Lab  Results  Component Value Date   HGBA1C 5.5 09/06/2021     Eliezer Lofts, MD

## 2021-09-08 LAB — URINE CULTURE
MICRO NUMBER:: 12535090
SPECIMEN QUALITY:: ADEQUATE

## 2021-09-10 ENCOUNTER — Telehealth: Payer: Self-pay | Admitting: Family Medicine

## 2021-09-10 ENCOUNTER — Other Ambulatory Visit: Payer: Self-pay | Admitting: Family Medicine

## 2021-09-10 MED ORDER — SULFAMETHOXAZOLE-TRIMETHOPRIM 800-160 MG PO TABS: 1.0000 | ORAL_TABLET | Freq: Two times a day (BID) | ORAL | 0 refills | Status: DC

## 2021-09-10 NOTE — Telephone Encounter (Signed)
Let pt know I will repeat the course of Bactrim if still not improving she needs to come in for an office visit.

## 2021-09-10 NOTE — Telephone Encounter (Signed)
Carolyn Cordova notified as instructed by telephone.  Patient states understanding.

## 2021-09-10 NOTE — Telephone Encounter (Signed)
Pt called stating that she is not burning as bad but still having pressure and back pain.Please advise

## 2021-09-19 NOTE — Telephone Encounter (Signed)
Spoke with patient about member representation form needing to be completed. She reviewed previously sent MyChart message on 09/15/21. Advised her I will email so she can sign and return via fax or email. She verbalized understanding of plan.

## 2021-09-20 NOTE — Telephone Encounter (Signed)
Appeal for repatha faxed to [facility] at [phone]

## 2021-11-06 NOTE — Telephone Encounter (Signed)
Called [employer] to check on status of appeal  Level 1 appeal denied Was provided phone # to call to speak with appeals analyst Thad Ranger [phone])  Called and left message for Lelon Frohlich to call back

## 2021-12-24 ENCOUNTER — Ambulatory Visit (HOSPITAL_BASED_OUTPATIENT_CLINIC_OR_DEPARTMENT_OTHER): Payer: BC Managed Care – PPO | Admitting: Internal Medicine

## 2022-02-22 ENCOUNTER — Other Ambulatory Visit: Payer: Self-pay

## 2022-02-22 ENCOUNTER — Emergency Department
Admission: EM | Admit: 2022-02-22 | Discharge: 2022-02-22 | Disposition: A | Discharge status: Home / Self Care | Payer: BC Managed Care – PPO | Attending: Emergency Medicine | Admitting: Emergency Medicine

## 2022-02-22 ENCOUNTER — Emergency Department: Payer: BC Managed Care – PPO

## 2022-02-22 DIAGNOSIS — N23 Unspecified renal colic: Secondary | ICD-10-CM | POA: Insufficient documentation

## 2022-02-22 DIAGNOSIS — K7689 Other specified diseases of liver: Secondary | ICD-10-CM | POA: Diagnosis not present

## 2022-02-22 DIAGNOSIS — R61 Generalized hyperhidrosis: Secondary | ICD-10-CM | POA: Diagnosis not present

## 2022-02-22 DIAGNOSIS — I700 Atherosclerosis of aorta: Secondary | ICD-10-CM | POA: Diagnosis not present

## 2022-02-22 DIAGNOSIS — R1031 Right lower quadrant pain: Secondary | ICD-10-CM | POA: Diagnosis not present

## 2022-02-22 DIAGNOSIS — N132 Hydronephrosis with renal and ureteral calculous obstruction: Secondary | ICD-10-CM | POA: Diagnosis not present

## 2022-02-22 LAB — BASIC METABOLIC PANEL
Anion gap: 10 (ref 5–15)
BUN: 22 mg/dL (ref 8–23)
CO2: 26 mmol/L (ref 22–32)
Calcium: 9.2 mg/dL (ref 8.9–10.3)
Chloride: 105 mmol/L (ref 98–111)
Creatinine, Ser: 1.04 mg/dL — ABNORMAL HIGH (ref 0.44–1.00)
GFR, Estimated: >60 mL/min (ref >60)
Glucose, Bld: 122 mg/dL — ABNORMAL HIGH (ref 70–99)
Potassium: 3.6 mmol/L (ref 3.5–5.1)
Sodium: 141 mmol/L (ref 135–145)

## 2022-02-22 LAB — URINALYSIS, ROUTINE W REFLEX MICROSCOPIC
Bilirubin Urine: NEGATIVE
Bilirubin Urine: NEGATIVE
Glucose, UA: NEGATIVE mg/dL
Glucose, UA: NEGATIVE mg/dL
Ketones, ur: 5 mg/dL — AB
Ketones, ur: 5 mg/dL — AB
Leukocytes,Ua: NEGATIVE
Leukocytes,Ua: NEGATIVE
Nitrite: NEGATIVE
Nitrite: NEGATIVE
Protein, ur: NEGATIVE mg/dL
Protein, ur: NEGATIVE mg/dL
RBC / HPF: >50 RBC/hpf — ABNORMAL HIGH (ref 0–5)
Specific Gravity, Urine: 1.017 (ref 1.005–1.030)
Specific Gravity, Urine: 1.017 (ref 1.005–1.030)
pH: 7 (ref 5.0–8.0)
pH: 7 (ref 5.0–8.0)

## 2022-02-22 LAB — CBC
HCT: 47.5 % — ABNORMAL HIGH (ref 36.0–46.0)
Hemoglobin: 15.5 g/dL — ABNORMAL HIGH (ref 12.0–15.0)
MCH: 30.8 pg (ref 26.0–34.0)
MCHC: 32.6 g/dL (ref 30.0–36.0)
MCV: 94.2 fL (ref 80.0–100.0)
Platelets: 257 10*3/uL (ref 150–400)
RBC: 5.04 MIL/uL (ref 3.87–5.11)
RDW: 12.4 % (ref 11.5–15.5)
WBC: 11.3 10*3/uL — ABNORMAL HIGH (ref 4.0–10.5)
nRBC: 0 % (ref 0.0–0.2)

## 2022-02-22 MED ORDER — MORPHINE SULFATE (PF) 4 MG/ML IV SOLN
4.0000 mg | Freq: Once | INTRAVENOUS | Status: AC
  Administered 2022-02-22: 4 mg via INTRAVENOUS
  Filled 2022-02-22: qty 1

## 2022-02-22 MED ORDER — OXYCODONE-ACETAMINOPHEN 5-325 MG PO TABS
1.0000 | ORAL_TABLET | ORAL | 0 refills | Status: DC | PRN
Start: 2022-02-22 — End: 2022-09-17

## 2022-02-22 MED ORDER — TAMSULOSIN HCL 0.4 MG PO CAPS: 0.4000 mg | ORAL_CAPSULE | Freq: Every day | ORAL | 0 refills | Status: DC

## 2022-02-22 MED ORDER — ONDANSETRON 4 MG PO TBDP
ORAL_TABLET | ORAL | Status: AC
  Filled 2022-02-22: qty 1

## 2022-02-22 MED ORDER — ONDANSETRON 4 MG PO TBDP: 4.0000 mg | ORAL_TABLET | Freq: Once | ORAL | Status: DC

## 2022-02-22 MED ORDER — SODIUM CHLORIDE 0.9 % IV BOLUS
1000.0000 mL | Freq: Once | INTRAVENOUS | Status: AC
  Administered 2022-02-22: 1000 mL via INTRAVENOUS

## 2022-02-22 MED ORDER — ONDANSETRON 4 MG PO TBDP: 4.0000 mg | ORAL_TABLET | Freq: Three times a day (TID) | ORAL | 0 refills | Status: DC | PRN

## 2022-02-22 MED ORDER — KETOROLAC TROMETHAMINE 15 MG/ML IJ SOLN
15.0000 mg | Freq: Once | INTRAMUSCULAR | Status: AC
  Administered 2022-02-22: 15 mg via INTRAVENOUS
  Filled 2022-02-22: qty 1

## 2022-02-22 MED ORDER — OXYCODONE-ACETAMINOPHEN 5-325 MG PO TABS: 1.0000 | ORAL_TABLET | ORAL | 0 refills | Status: DC | PRN

## 2022-02-22 MED ORDER — NITROFURANTOIN MONOHYD MACRO 100 MG PO CAPS: 100.0000 mg | ORAL_CAPSULE | Freq: Two times a day (BID) | ORAL | 0 refills | Status: AC

## 2022-02-22 MED ORDER — ONDANSETRON 4 MG PO TBDP
4.0000 mg | ORAL_TABLET | Freq: Once | ORAL | Status: AC
  Administered 2022-02-22: 4 mg via ORAL

## 2022-02-22 MED ORDER — ONDANSETRON HCL 4 MG/2ML IJ SOLN
4.0000 mg | Freq: Once | INTRAMUSCULAR | Status: AC
  Administered 2022-02-22: 4 mg via INTRAVENOUS
  Filled 2022-02-22: qty 2

## 2022-02-22 NOTE — Discharge Instructions (Addendum)
You have a kidney stone.  Please use the Percocet 1 pill 4 times a day as needed for pain.  You can use the Zofran if need be for nausea 1 dose 3 times a day of the melt on your tongue wafers.  And I would take the Flomax once a day to help make sure the stone passes.  Once the stone passes you do not need to take any more of any of these.  Please follow-up with urology.  Dr. Caprice Beaver is on-call.  Give his office a call in the morning on Monday and let him know that you have a 2 mm kidney stone.  He should be able to see you in a week or so.  The stone should pass by itself.  Please strain all your urine and try to catch it and take it to him.  You have 1 other kidney stone up inside the kidney.  This is not causing any problems currently. ?

## 2022-02-22 NOTE — ED Provider Notes (Signed)
? ?[facility] ?Provider Note ? ? ? Event Date/Time  ? First MD Initiated Contact with Patient 02/22/22 1433   ?  (approximate) ? ? ?History  ? ?Flank Pain and Nausea ? ? ?HPI ? ?SKYLENE DEREMER is a 61 y.o. female who comes in complaining of low back pain and right lower quadrant pain wrapping around from 1 side to the other.  It started this morning its been going on since.  It is coming and going.  There are waves of nausea.  Patient says she has a history of kidney stones and this feels like a kidney stone to her although she has no hematuria and only a little bit of burning with urination.  She has no fever.  She did have a little bit of sweating and some nausea. ? ?  ? ? ?Physical Exam  ? ?Triage Vital Signs: ?ED Triage Vitals  ?Enc Vitals Group  ?   BP 02/22/22 1354 (!) 153/83  ?   Pulse Rate 02/22/22 1354 74  ?   Resp 02/22/22 1354 16  ?   Temp 02/22/22 1354 98 ?F (36.7 ?C)  ?   Temp Source 02/22/22 1354 Oral  ?   SpO2 02/22/22 1354 100 %  ?   Weight 02/22/22 1357 142 lb (64.4 kg)  ?   Height 02/22/22 1357 '5\' 9"'$  (1.753 m)  ?   Head Circumference --   ?   Peak Flow --   ?   Pain Score 02/22/22 1356 7  ?   Pain Loc --   ?   Pain Edu? --   ?   Excl. in [place]? --   ? ? ?Most recent vital signs: ?Vitals:  ? 02/22/22 1430 02/22/22 1612  ?BP: (!) 148/78 134/74  ?Pulse: 81 83  ?Resp:  18  ?Temp:    ?SpO2: 98% 97%  ? ? ? ?General: Awake, no distress. ?CV:  Good peripheral perfusion.  Heart regular rate and rhythm no audible murmurs ?Resp:  Normal effort.  Lungs are clear ?Abd:  No distention.  Patient does have right lower quadrant pain to palpation and percussion.  There is no pain elsewhere no CVA area tenderness ?Extremities: No edema ? ? ?ED Results / Procedures / Treatments  ? ?Labs ?(all labs ordered are listed, but only abnormal results are displayed) ?Labs Reviewed  ?URINALYSIS, ROUTINE W REFLEX MICROSCOPIC - Abnormal; Notable for the following components:  ?    Result Value  ? Color, Urine  YELLOW (*)   ? APPearance CLOUDY (*)   ? Hgb urine dipstick LARGE (*)   ? Ketones, ur 5 (*)   ? All other components within normal limits  ?BASIC METABOLIC PANEL - Abnormal; Notable for the following components:  ? Glucose, Bld 122 (*)   ? Creatinine, Ser 1.04 (*)   ? All other components within normal limits  ?CBC - Abnormal; Notable for the following components:  ? WBC 11.3 (*)   ? Hemoglobin 15.5 (*)   ? HCT 47.5 (*)   ? All other components within normal limits  ? ? ? ?EKG ? ? ? ? ?RADIOLOGY ? ?CT read by radiology films reviewed by me show a 7 mm stone in the kidney and a 2 mm stone in the right distal ureter. ?PROCEDURES: ? ?Critical Care performed:  ? ?Procedures ? ? ?MEDICATIONS ORDERED IN ED: ?Medications  ?ondansetron (ZOFRAN-ODT) disintegrating tablet 4 mg (4 mg Oral Not Given 02/22/22 1507)  ?ondansetron (ZOFRAN-ODT) disintegrating  tablet 4 mg (4 mg Oral Given 02/22/22 1410)  ?sodium chloride 0.9 % bolus 1,000 mL (1,000 mLs Intravenous New Bag/Given 02/22/22 1447)  ?morphine (PF) 4 MG/ML injection 4 mg (4 mg Intravenous Given 02/22/22 1443)  ?ondansetron (ZOFRAN) injection 4 mg (4 mg Intravenous Given 02/22/22 1443)  ?ketorolac (TORADOL) 15 MG/ML injection 15 mg (15 mg Intravenous Given 02/22/22 1624)  ? ? ? ?IMPRESSION / MDM / ASSESSMENT AND PLAN / ED COURSE  ?I reviewed the triage vital signs and the nursing notes. ?Urinalysis does not show any leukocyte Estrace.  I have asked repeatedly for the micro but it has not been done yet.  Patient's pain is better after Toradol.  She does have a kidney stone.  I will plan on letting her go.  We will give her some Percocet and Flomax and have her follow-up with urology.  She will return for increasing pain fever vomiting or any other problems. ? ? ?  ? ? ?FINAL CLINICAL IMPRESSION(S) / ED DIAGNOSES  ? ?Final diagnoses:  ?Renal colic on right side  ? ? ? ?Rx / DC Orders  ? ?ED Discharge Orders   ? ? None  ? ?  ? ? ? ?Note:  This document was prepared using Dragon voice  recognition software and may include unintentional dictation errors. ?  ?Nena Polio, MD ?02/22/22 1700 ? ?

## 2022-02-22 NOTE — ED Triage Notes (Signed)
Pt to ED for back pain, flank pain on R side that wraps around to R groin area. Pt states since this morning is having waves of nausea. Pt has hx kidney stones. No hematuria. Endorses slight stinging sensation with urination. ?Denies fevers but has had some diaphoresis. ?

## 2022-02-22 NOTE — ED Provider Notes (Signed)
UA with reflex resulted with slight elevation of WBC, but continues to show nitrite, leukocyte negative. No significant serum leukocytosis. At this time think unlikely infected stone. Will proceed with discharge prepared by Dr. Cinda Quest.  ?  ?Nance Pear, MD ?02/22/22 1746 ? ?

## 2022-02-22 NOTE — ED Notes (Signed)
Dc instructions and scripts reviewed with pt no questions or concerns at this time. Will follow up with urology. Pt declined wheelchair and insisted on walking to car. Daughter at pt side.  ?

## 2022-02-26 ENCOUNTER — Ambulatory Visit
Admission: RE | Admit: 2022-02-26 | Discharge: 2022-02-26 | Disposition: A | Discharge status: Home / Self Care | Payer: BC Managed Care – PPO | Attending: Urology | Admitting: Urology

## 2022-02-26 ENCOUNTER — Ambulatory Visit
Admission: RE | Admit: 2022-02-26 | Discharge: 2022-02-26 | Disposition: A | Discharge status: Home / Self Care | Payer: BC Managed Care – PPO | Source: Ambulatory Visit | Attending: Urology | Admitting: Urology

## 2022-02-26 ENCOUNTER — Other Ambulatory Visit: Payer: Self-pay | Admitting: *Deleted

## 2022-02-26 ENCOUNTER — Ambulatory Visit (INDEPENDENT_AMBULATORY_CARE_PROVIDER_SITE_OTHER): Payer: BC Managed Care – PPO | Admitting: Urology

## 2022-02-26 VITALS — BP 137/81 | HR 48 | Ht 68.0 in | Wt 142.4 lb

## 2022-02-26 DIAGNOSIS — N201 Calculus of ureter: Secondary | ICD-10-CM | POA: Diagnosis not present

## 2022-02-26 DIAGNOSIS — I878 Other specified disorders of veins: Secondary | ICD-10-CM | POA: Diagnosis not present

## 2022-02-26 DIAGNOSIS — N200 Calculus of kidney: Secondary | ICD-10-CM

## 2022-02-26 LAB — URINALYSIS, COMPLETE
Bilirubin, UA: NEGATIVE
Glucose, UA: NEGATIVE
Ketones, UA: NEGATIVE
Leukocytes,UA: NEGATIVE
Nitrite, UA: NEGATIVE
Protein,UA: NEGATIVE
RBC, UA: NEGATIVE
Specific Gravity, UA: 1.015 (ref 1.005–1.030)
Urobilinogen, Ur: 0.2 mg/dL (ref 0.2–1.0)
pH, UA: 7 (ref 5.0–7.5)

## 2022-02-26 LAB — MICROSCOPIC EXAMINATION

## 2022-02-26 NOTE — Patient Instructions (Signed)
Dietary Guidelines to Help Prevent Kidney Stones Kidney stones are deposits of minerals and salts that form inside your kidneys. Your risk of developing kidney stones may be greater depending on your diet, your lifestyle, the medicines you take, and whether you have certain medical conditions. Most people can lower their chances of developing kidney stones by following the instructions below. Your dietitian may give you more specific instructions depending on your overall health and the type of kidney stones you tend to develop. What are tips for following this plan? Reading food labels  Choose foods with "no salt added" or "low-salt" labels. Limit your salt (sodium) intake to less than 1,500 mg a day. Choose foods with calcium for each meal and snack. Try to eat about 300 mg of calcium at each meal. Foods that contain 200-500 mg of calcium a serving include: 8 oz (237 mL) of milk, calcium-fortifiednon-dairy milk, and calcium-fortifiedfruit juice. Calcium-fortified means that calcium has been added to these drinks. 8 oz (237 mL) of kefir, yogurt, and soy yogurt. 4 oz (114 g) of tofu. 1 oz (28 g) of cheese. 1 cup (150 g) of dried figs. 1 cup (91 g) of cooked broccoli. One 3 oz (85 g) can of sardines or mackerel. Most people need 1,000-1,500 mg of calcium a day. Talk to your dietitian about how much calcium is recommended for you. Shopping Buy plenty of fresh fruits and vegetables. Most people do not need to avoid fruits and vegetables, even if these foods contain nutrients that may contribute to kidney stones. When shopping for convenience foods, choose: Whole pieces of fruit. Pre-made salads with dressing on the side. Low-fat fruit and yogurt smoothies. Avoid buying frozen meals or prepared deli foods. These can be high in sodium. Look for foods with live cultures, such as yogurt and kefir. Choose high-fiber grains, such as whole-wheat breads, oat bran, and wheat cereals. Cooking Do not add  salt to food when cooking. Place a salt shaker on the table and allow each person to add his or her own salt to taste. Use vegetable protein, such as beans, textured vegetable protein (TVP), or tofu, instead of meat in pasta, casseroles, and soups. Meal planning Eat less salt, if told by your dietitian. To do this: Avoid eating processed or pre-made food. Avoid eating fast food. Eat less animal protein, including cheese, meat, poultry, or fish, if told by your dietitian. To do this: Limit the number of times you have meat, poultry, fish, or cheese each week. Eat a diet free of meat at least 2 days a week. Eat only one serving each day of meat, poultry, fish, or seafood. When you prepare animal protein, cut pieces into small portion sizes. For most meat and fish, one serving is about the size of the palm of your hand. Eat at least five servings of fresh fruits and vegetables each day. To do this: Keep fruits and vegetables on hand for snacks. Eat one piece of fruit or a handful of berries with breakfast. Have a salad and fruit at lunch. Have two kinds of vegetables at dinner. Limit foods that are high in a substance called oxalate. These include: Spinach (cooked), rhubarb, beets, sweet potatoes, and Swiss chard. Peanuts. Potato chips, french fries, and baked potatoes with skin on. Nuts and nut products. Chocolate. If you regularly take a diuretic medicine, make sure to eat at least 1 or 2 servings of fruits or vegetables that are high in potassium each day. These include: Avocado. Banana. Orange, prune,   carrot, or tomato juice. Baked potato. Cabbage. Beans and split peas. Lifestyle  Drink enough fluid to keep your urine pale yellow. This is the most important thing you can do. Spread your fluid intake throughout the day. If you drink alcohol: Limit how much you use to: 0-1 drink a day for women who are not pregnant. 0-2 drinks a day for men. Be aware of how much alcohol is in your  drink. In the [place] one drink equals one 12 oz bottle of beer (355 mL), one 5 oz glass of wine (148 mL), or one 1 oz glass of hard liquor (44 mL). Lose weight if told by your health care provider. Work with your dietitian to find an eating plan and weight loss strategies that work best for you. General information Talk to your health care provider and dietitian about taking daily supplements. You may be told the following depending on your health and the cause of your kidney stones: Not to take supplements with vitamin C. To take a calcium supplement. To take a daily probiotic supplement. To take other supplements such as magnesium, fish oil, or vitamin B6. Take over-the-counter and prescription medicines only as told by your health care provider. These include supplements. What foods should I limit? Limit your intake of the following foods, or eat them as told by your dietitian. Vegetables Spinach. Rhubarb. Beets. Canned vegetables. Pickles. Olives. Baked potatoes with skin. Grains Wheat bran. Baked goods. Salted crackers. Cereals high in sugar. Meats and other proteins Nuts. Nut butters. Large portions of meat, poultry, or fish. Salted, precooked, or cured meats, such as sausages, meat loaves, and hot dogs. Dairy Cheese. Beverages Regular soft drinks. Regular vegetable juice. Seasonings and condiments Seasoning blends with salt. Salad dressings. Soy sauce. Ketchup. Barbecue sauce. Other foods Canned soups. Canned pasta sauce. Casseroles. Pizza. Lasagna. Frozen meals. Potato chips. French fries. The items listed above may not be a complete list of foods and beverages you should limit. Contact a dietitian for more information. What foods should I avoid? Talk to your dietitian about specific foods you should avoid based on the type of kidney stones you have and your overall health. Fruits Grapefruit. The item listed above may not be a complete list of foods and beverages you should  avoid. Contact a dietitian for more information. Summary Kidney stones are deposits of minerals and salts that form inside your kidneys. You can lower your risk of kidney stones by making changes to your diet. The most important thing you can do is drink enough fluid. Drink enough fluid to keep your urine pale yellow. Talk to your dietitian about how much calcium you should have each day, and eat less salt and animal protein as told by your dietitian. This information is not intended to replace advice given to you by your health care provider. Make sure you discuss any questions you have with your health care provider. Document Revised: 10/27/2019 Document Reviewed: 10/27/2019 Elsevier Patient Education  2022 [employer]  

## 2022-02-26 NOTE — Progress Notes (Signed)
? ?02/26/22 ?10:10 AM  ? ?CHRISTIN MOLINE ?03-28-1961 ?[id number] ? ?CC: Right distal ureteral and right renal stone ? ?HPI: ?61 year old female who presented to the ED on 02/22/2022 with nausea and severe right lower quadrant pain.  CT showed a 2 mm right distal ureteral stone with upstream hydronephrosis, and a nonobstructive 7 mm right renal stone.  She was treated with medical expulsive therapy and discharged home.  She reports she had a kidney stones over 20 years ago, but has never required surgery.  She passed her stone in the last few days and was able to catch this in the [place].  She denies any urinary symptoms, and urinalysis today is completely benign. ? ? ?PMH: ?Past Medical History:  ?Diagnosis Date  ? Allergy   ? seasonal  ? Carpal tunnel syndrome   ? Cervical disc disease   ? GERD (gastroesophageal reflux disease)   ? Hyperlipidemia   ? Hypertension   ? Migraine   ? PMDD (premenstrual dysphoric disorder)   ? ? ?Surgical History: ?Past Surgical History:  ?Procedure Laterality Date  ? CESAREAN SECTION    ? COLONOSCOPY    ? 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 lipoma removal  2006  ? PILONIDAL CYST EXCISION  1980s  ? x2  ? plantar fasciitis  8/12  ? surgery with Dr Milinda Pointer  ? POLYPECTOMY    ? SPINE SURGERY  03/11  ? cervical spine surgery  ? ? ? ?Family History: ?Family History  ?Problem Relation Age of Onset  ? Hyperlipidemia Mother   ? Cancer Mother   ?     breast CA  ? Breast cancer Mother   ? Cancer Father   ?     adrenal cancer  ? Colon polyps Father   ? Hyperlipidemia Father   ? Hyperlipidemia Brother   ? Cancer Cousin   ?     breast CA  ? Colon cancer Neg Hx   ? Esophageal cancer Neg Hx   ? Rectal cancer Neg Hx   ? Stomach cancer Neg Hx   ? ? ?Social History:  reports that she has been smoking cigarettes. She has been smoking an average of 1 pack per day. She has never used smokeless tobacco. She reports that she does not drink alcohol and does not use drugs. ? ?Physical Exam: ?BP  137/81 (BP Location: Left Arm, Patient Position: Sitting, Cuff Size: Normal)   Pulse (!) 48   Ht '5\' 8"'$  (1.727 m)   Wt 142 lb 6.4 oz (64.6 kg)   BMI 21.65 kg/m?   ? ?Constitutional:  Alert and oriented, No acute distress. ?Cardiovascular: No clubbing, cyanosis, or edema. ?Respiratory: Normal respiratory effort, no increased work of breathing. ?GI: Abdomen is soft, nontender, nondistended, no abdominal masses ? ? ?Laboratory Data: ?Reviewed ? 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  Stone measures 750 HU, 7cm SSD.  No evidence of distal ureteral stone on KUB today, right renal stone clearly seen. ? ?Assessment & Plan:   ?61 year old female with recently passed 2 mm right distal ureteral stone, has a remaining 7 mm nonobstructive right renal stone.  We discussed options for the remaining right renal stone including observation and surveillance with KUB in 6 months, shockwave lithotripsy, or ureteroscopy.  Risks and benefits  discussed, and she would like to continue with surveillance which is very reasonable.  She would be a good candidate for shockwave lithotripsy if she opted for observation. ? ?We discussed general stone prevention strategies including adequate hydration with goal of producing 2.5 L of urine daily, increasing citric acid intake, increasing calcium intake during high oxalate meals, minimizing animal protein, and decreasing salt intake. Information about dietary recommendations given today.  ? ?RTC 6 months KUB for right renal stone surveillance ?Discontinue Flomax and nitrofurantoin ? ?Nickolas Madrid, MD ?02/26/2022 ? ?[facility] ?[address] ?[place], [place] [address] ?[phone] ? ?

## 2022-03-03 LAB — CALCULI, WITH PHOTOGRAPH (CLINICAL LAB)
Calcium Oxalate Dihydrate: 40 %
Calcium Oxalate Monohydrate: 60 %
Weight Calculi: 6 mg

## 2022-03-17 NOTE — Telephone Encounter (Signed)
Attempted to obtain update on appeal ?Left message for appeals analyst, Webb Silversmith to call back, fax info, or mail determination  ?

## 2022-03-19 NOTE — Telephone Encounter (Addendum)
Received fax from [facility] that denial for Repatha was upheld, despite appeal.  ? ?Letter states medication is approved to treat the high cholesterol disorder, primary hyperlipidemia. This medication is approved when a member has a 30-39% 10 year atherosclerotic cardiovascular disease (ASCVD) risk when ALL of the following are met: member has an LDL-C level = 13m/dL while taking max-tolerated statin therapy (such as atorvastatin or rosuvastatin), and member has less severe ASCVD (such as no coronary artery bypass grafting, a prior event more than 2 years prior, no clinical PAD, and poorly controlled cardiometabolic risk factors (such as age =65, current smoking, long term kidney disease, history of stroke or heart attack)." ? ?Letter states: "In this case, the member does not have certain conditions to confirm a 30-39% 10-year ASCVD risk.  ? ?Per review of chart: ?- clinical ASCVD = 45.7 (83rd %) ?- history of heart disease in the family including her father who had sudden cardiac death in his 681sbut had a prior cardiovascular event that was unrecognized; mother has a history of hypertension ?- her only risk factors include hypertension and age ?- labs April 2022 her total cholesterol was 251, HDL 52, triglycerides 113 and LDL 176 ?- she has had myalgias mostly with statins including rosuvastatin, atorvastatin and possibly others ?

## 2022-03-21 NOTE — Telephone Encounter (Signed)
Apparently she tolerated low dose Crestor before - recommend 5 mg daily - repeat lipid in 3 months. Probably will need to add Zetia at that time. ? ?Dr Lemmie Evens ?

## 2022-03-24 MED ORDER — ROSUVASTATIN CALCIUM 5 MG PO TABS: 5.0000 mg | ORAL_TABLET | Freq: Every day | ORAL | 3 refills | Status: DC

## 2022-03-24 NOTE — Telephone Encounter (Signed)
PCSK9 not covered. Patient will to re-trial crestor '5mg'$  QD. Rx(s) sent to pharmacy electronically. Lab order mailed ?

## 2022-03-24 NOTE — Addendum Note (Signed)
Addended by: Fidel Levy on: 03/24/2022 08:01 AM ? ? Modules accepted: Orders ? ?

## 2022-04-04 DIAGNOSIS — E7800 Pure hypercholesterolemia, unspecified: Secondary | ICD-10-CM | POA: Diagnosis not present

## 2022-04-07 LAB — NMR, LIPOPROFILE
Cholesterol, Total: 241 mg/dL — ABNORMAL HIGH (ref 100–199)
HDL Particle Number: 30 umol/L — ABNORMAL LOW (ref >=30.5)
HDL-C: 68 mg/dL (ref >39)
LDL Particle Number: 2144 nmol/L — ABNORMAL HIGH (ref <1000)
LDL Size: 21.2 nm (ref >20.5)
LDL-C (NIH Calc): 160 mg/dL — ABNORMAL HIGH (ref 0–99)
LP-IR Score: <25 (ref <=45)
Small LDL Particle Number: 693 nmol/L — ABNORMAL HIGH (ref <=527)
Triglycerides: 74 mg/dL (ref 0–149)

## 2022-04-07 LAB — LIPOPROTEIN A (LPA): Lipoprotein (a): 19.5 nmol/L (ref <75.0)

## 2022-04-15 DIAGNOSIS — T466X5A Adverse effect of antihyperlipidemic and antiarteriosclerotic drugs, initial encounter: Secondary | ICD-10-CM

## 2022-04-15 DIAGNOSIS — E7800 Pure hypercholesterolemia, unspecified: Secondary | ICD-10-CM

## 2022-04-20 ENCOUNTER — Other Ambulatory Visit: Payer: Self-pay | Admitting: Family Medicine

## 2022-08-21 ENCOUNTER — Other Ambulatory Visit: Payer: Self-pay | Admitting: Family Medicine

## 2022-08-21 NOTE — Telephone Encounter (Signed)
Patient scheduled.

## 2022-08-21 NOTE — Telephone Encounter (Signed)
Pt is overdue for her CPE, please schedule (labs prior) and then route back to me to refill

## 2022-08-28 ENCOUNTER — Ambulatory Visit: Payer: BC Managed Care – PPO | Admitting: Urology

## 2022-09-11 ENCOUNTER — Ambulatory Visit
Admission: RE | Admit: 2022-09-11 | Discharge: 2022-09-11 | Disposition: A | Discharge status: Home / Self Care | Payer: BC Managed Care – PPO | Attending: Urology | Admitting: Urology

## 2022-09-11 ENCOUNTER — Ambulatory Visit
Admission: RE | Admit: 2022-09-11 | Discharge: 2022-09-11 | Disposition: A | Discharge status: Home / Self Care | Payer: BC Managed Care – PPO | Source: Ambulatory Visit | Attending: Urology | Admitting: Urology

## 2022-09-11 DIAGNOSIS — N2889 Other specified disorders of kidney and ureter: Secondary | ICD-10-CM | POA: Diagnosis not present

## 2022-09-11 DIAGNOSIS — N200 Calculus of kidney: Secondary | ICD-10-CM | POA: Insufficient documentation

## 2022-09-11 DIAGNOSIS — I878 Other specified disorders of veins: Secondary | ICD-10-CM | POA: Diagnosis not present

## 2022-09-11 DIAGNOSIS — Z87442 Personal history of urinary calculi: Secondary | ICD-10-CM | POA: Diagnosis not present

## 2022-09-11 DIAGNOSIS — R109 Unspecified abdominal pain: Secondary | ICD-10-CM | POA: Diagnosis not present

## 2022-09-17 ENCOUNTER — Encounter: Payer: Self-pay | Admitting: Family

## 2022-09-17 ENCOUNTER — Ambulatory Visit (INDEPENDENT_AMBULATORY_CARE_PROVIDER_SITE_OTHER): Payer: BC Managed Care – PPO | Admitting: Urology

## 2022-09-17 ENCOUNTER — Other Ambulatory Visit: Payer: Self-pay | Admitting: Urology

## 2022-09-17 ENCOUNTER — Telehealth: Payer: Self-pay | Admitting: Family Medicine

## 2022-09-17 ENCOUNTER — Encounter: Payer: Self-pay | Admitting: Urology

## 2022-09-17 ENCOUNTER — Ambulatory Visit (INDEPENDENT_AMBULATORY_CARE_PROVIDER_SITE_OTHER): Payer: BC Managed Care – PPO | Admitting: Family

## 2022-09-17 VITALS — BP 114/79 | HR 57 | Ht 69.0 in | Wt 137.0 lb

## 2022-09-17 VITALS — BP 110/68 | HR 77 | Temp 98.2°F | Resp 16 | Ht 69.0 in | Wt 137.2 lb

## 2022-09-17 DIAGNOSIS — J40 Bronchitis, not specified as acute or chronic: Secondary | ICD-10-CM | POA: Diagnosis not present

## 2022-09-17 DIAGNOSIS — E7800 Pure hypercholesterolemia, unspecified: Secondary | ICD-10-CM

## 2022-09-17 DIAGNOSIS — I10 Essential (primary) hypertension: Secondary | ICD-10-CM

## 2022-09-17 DIAGNOSIS — N200 Calculus of kidney: Secondary | ICD-10-CM

## 2022-09-17 DIAGNOSIS — R81 Glycosuria: Secondary | ICD-10-CM

## 2022-09-17 DIAGNOSIS — Z20822 Contact with and (suspected) exposure to covid-19: Secondary | ICD-10-CM | POA: Diagnosis not present

## 2022-09-17 DIAGNOSIS — N760 Acute vaginitis: Secondary | ICD-10-CM

## 2022-09-17 LAB — POC COVID19 BINAXNOW: SARS Coronavirus 2 Ag: NEGATIVE

## 2022-09-17 MED ORDER — AZITHROMYCIN 250 MG PO TABS: ORAL_TABLET | ORAL | 0 refills | Status: AC

## 2022-09-17 MED ORDER — FLUCONAZOLE 150 MG PO TABS: 150.0000 mg | ORAL_TABLET | Freq: Once | ORAL | 0 refills | Status: AC

## 2022-09-17 MED ORDER — PREDNISONE 20 MG PO TABS: ORAL_TABLET | ORAL | 0 refills | Status: DC

## 2022-09-17 NOTE — Progress Notes (Signed)
   09/17/2022 10:49 AM   [name] Mar 04, 1961 [id number]  Reason for visit: Follow up nephrolithiasis  HPI: 61 year old female who spontaneously passed a 2 mm right distal ureteral stone in April 2023, and was also found to have a 7 mm right lower pole nonobstructive renal stone.  She initially opted for observation.  She denies any urologic problems over the last 6 months.  I personally viewed and interpreted the KUB today that shows a stable 7 mm right renal stone.  She denies any flank pain or gross hematuria.  We discussed various treatment options for urolithiasis including observation with or without medical expulsive therapy, shockwave lithotripsy (SWL), ureteroscopy and laser lithotripsy with stent placement, and percutaneous nephrolithotomy.We discussed that management is based on stone size, location, density, patient co-morbidities, and patient preference. Stones <60m in size have a >80% spontaneous passage rate. Data surrounding the use of tamsulosin for medical expulsive therapy is controversial, but meta analyses suggests it is most efficacious for distal stones between 5-124min size. Possible side effects include dizziness/lightheadedness, and retrograde ejaculation.SWL has a lower stone free rate in a single procedure, but also a lower complication rate compared to ureteroscopy and avoids a stent and associated stent related symptoms. Possible complications include renal hematoma, steinstrasse, and need for additional treatment.Ureteroscopy with laser lithotripsy and stent placement has a higher stone free rate than SWL in a single procedure, however increased complication rate including possible infection, ureteral injury, bleeding, and stent related morbidity. Common stent related symptoms include dysuria, urgency/frequency, and flank pain.  Using shared decision making, she is interested in observation for nonobstructing renal stone, and opts for shockwave  lithotripsy. Schedule right shockwave lithotripsy  BrBilley CoMD  [id number] [phone]

## 2022-09-17 NOTE — H&P (View-Only) (Signed)
   09/17/2022 10:49 AM   [name] [birth year] [id number]  Reason for visit: Follow up nephrolithiasis  HPI: 61 year old female who spontaneously passed a 2 mm right distal ureteral stone in April 2023, and was also found to have a 7 mm right lower pole nonobstructive renal stone.  She initially opted for observation.  She denies any urologic problems over the last 6 months.  I personally viewed and interpreted the KUB today that shows a stable 7 mm right renal stone.  She denies any flank pain or gross hematuria.  We discussed various treatment options for urolithiasis including observation with or without medical expulsive therapy, shockwave lithotripsy (SWL), ureteroscopy and laser lithotripsy with stent placement, and percutaneous nephrolithotomy.We discussed that management is based on stone size, location, density, patient co-morbidities, and patient preference. Stones <28m in size have a >80% spontaneous passage rate. Data surrounding the use of tamsulosin for medical expulsive therapy is controversial, but meta analyses suggests it is most efficacious for distal stones between 5-158min size. Possible side effects include dizziness/lightheadedness, and retrograde ejaculation.SWL has a lower stone free rate in a single procedure, but also a lower complication rate compared to ureteroscopy and avoids a stent and associated stent related symptoms. Possible complications include renal hematoma, steinstrasse, and need for additional treatment.Ureteroscopy with laser lithotripsy and stent placement has a higher stone free rate than SWL in a single procedure, however increased complication rate including possible infection, ureteral injury, bleeding, and stent related morbidity. Common stent related symptoms include dysuria, urgency/frequency, and flank pain.  Using shared decision making, she is interested in observation for nonobstructing renal stone, and opts for shockwave  lithotripsy. Schedule right shockwave lithotripsy  BrBilley CoMD  [place] [place] [phone]

## 2022-09-17 NOTE — Progress Notes (Unsigned)
ESWL ORDER FORM  Expected date of procedure: Patient preference, next few weeks  Surgeon:  MD on truck  Post op standing: 2-4wk follow up w/KUB prior  Anticoagulation/Aspirin/NSAID standing order: Hold all 72 hours prior  Anesthesia standing order: MAC  VTE standing: SCD's  Dx: Right Nephrolihtiasis  Procedure: right Extracorporeal shock wave lithotripsy  CPT : 17530  Standing Order Set:   *NPO after mn, KUB  *NS 134m/hr, Keflex 5024mPO, Benadryl 2560mO, Valium 44m45m, Zofran 4mg 65m   Medications if other than standing orders:   NONE

## 2022-09-17 NOTE — Patient Instructions (Signed)
ESWL for Kidney Stones  Extracorporeal shock wave lithotripsy (ESWL) is a treatment that can help break up kidney stones that are too large to pass on their own.  This is a nonsurgical procedure that breaks up a kidney stone with shock waves. These shock waves pass through your body and focus on the kidney stone. They cause the kidney stone to break into smaller pieces (fragments) while it is still in the urinary tract. The fragments of stone can pass more easily out of your body in the urine. Tell a health care provider about: Any allergies you have. All medicines you are taking, including vitamins, herbs, eye drops, creams, and over-the-counter medicines. Any problems you or family members have had with anesthetic medicines. Any bleeding problems you have. Any surgeries you have had. Any medical conditions you have. Whether you are pregnant or may be pregnant. What are the risks? Your health care provider will talk with you about risks. These may include: Infection. Bleeding from the kidney. Bruising of the kidney or skin. Scarring of the kidney. This can lead to: Increased blood pressure. Poor kidney function. Return (recurrence) of kidney stones. Damage to other structures or organs. This may include the liver, colon, spleen, or pancreas. Blockage (obstruction) of the tube that carries urine from the kidney to the bladder (ureter). Failure of the kidney stone to break into fragments. What happens before the procedure? When to stop eating and drinking Follow instructions from your health care provider about what you may eat and drink. These may include: 8 hours before your procedure Stop eating most foods. Do not eat meat, fried foods, or fatty foods. Eat only light foods, such as toast or crackers. All liquids are okay except energy drinks and alcohol. 6 hours before your procedure Stop eating. Drink only clear liquids, such as water, clear fruit juice, black coffee, plain tea,  and sports drinks. Do not drink energy drinks or alcohol. 2 hours before your procedure Stop drinking all liquids. You may be allowed to take medicines with small sips of water. If you do not follow your health care provider's instructions, your procedure may be delayed or canceled. Medicines Ask your health care provider about: Changing or stopping your regular medicines. These include any diabetes medicines or blood thinners you take. Taking medicines such as aspirin and ibuprofen. These medicines can thin your blood. Do not take them unless your health care provider tells you to. Taking over-the-counter medicines, vitamins, herbs, and supplements. Tests You may have tests, such as: Blood tests. Urine tests. Imaging tests. This may include a CT scan. Surgery safety Ask your health care provider: How your surgery site will be marked. What steps will be taken to help prevent infection. These steps may include: Washing skin with a soap that kills germs. Receiving antibiotics. General instructions If you will be going home right after the procedure, plan to have a responsible adult: Take you home from the hospital or clinic. You will not be allowed to drive. Care for you for the time you are told. What happens during the procedure?  An IV will be inserted into one of your veins. You may be given: A sedative. This helps you relax. Anesthesia. This will: Numb certain areas of your body. Make you fall asleep for surgery. A water-filled cushion may be placed behind your kidney or on your abdomen. In some cases, you may be placed in a tub of lukewarm water. Your body will be positioned in a way that makes it   easier to target the kidney stone. An X-ray or ultrasound exam will be done to locate your stone. Shock waves will be aimed at the stone. If you are awake, you may feel a tapping sensation as the shock waves pass through your body. A small mesh tube (stent) may be placed in your  ureter. This will help keep urine flowing from the kidney if the fragments of the stone have been blocking the ureter. The stent will be removed at a later time by your health care provider. The procedure may vary among health care providers and hospitals. What happens after the procedure? Your blood pressure, heart rate, breathing rate, and blood oxygen level will be monitored until you leave the hospital or clinic. You may have an X-ray after the procedure to see how many of the kidney stones were broken up. This will also show how much of the stone has passed. If there are still large fragments after treatment, you may need to have a second procedure at a later time. This information is not intended to replace advice given to you by your health care provider. Make sure you discuss any questions you have with your health care provider. Document Revised: 03/06/2022 Document Reviewed: 03/06/2022 Elsevier Patient Education  [place].  ESWL for Kidney Stones, Care After The following information offers guidance on how to care for yourself after your procedure. Your health care provider may also give you more specific instructions. If you have problems or questions, contact your health care provider. What can I expect after the procedure? After the procedure, it is common to have: Some blood in your urine. This should only last for a few days. Soreness in your back, sides, or upper abdomen for a few days. Blotches or bruises on the area where the shock wave entered the skin. Pain, discomfort, or nausea when pieces (fragments) of the kidney stone move through the tube that carries urine from the kidney to the bladder (ureter). Fragments may pass soon after the procedure. They may also take up to 4-8 weeks to pass. If you have severe pain or nausea, contact your health care provider. This may be caused by a large stone that was not broken up enough. This may mean that you need more  treatment. Some pain or discomfort during urination. Some pain or discomfort in the lower abdomen or at the base of the penis. Follow these instructions at home: Medicines  Take over-the-counter and prescription medicines only as told by your health care provider. If you were prescribed antibiotics, take them as told by your health care provider. Do not stop using the antibiotic even if you start to feel better. Ask your health care provider if the medicine prescribed to you: Requires you to avoid driving or using machinery. Can cause constipation. You may need to take these actions to prevent or treat constipation: Take over-the-counter or prescription medicines. Eat foods that are high in fiber, such as beans, whole grains, and fresh fruits and vegetables. Limit foods that are high in fat and processed sugars, such as fried or sweet foods. Eating and drinking  Follow instructions from your health care provider about what you may eat and drink. You may be told to: Reduce how much salt (sodium) you eat or drink. Check ingredients and nutrition facts on packaged foods and drinks to see how much sodium they contain. Reduce how much meat you eat. Drink enough fluid to keep your urine pale yellow. This can help you pass  any pieces of the stone that are left. It can also prevent new stones from forming. Eat plenty of fresh fruits and vegetables. Eat the recommended amount of calcium for your age and gender. Ask your health care provider how much calcium you should have. Activity Get plenty of rest as told by your health care provider. Avoid sitting for a long time without moving. Get up to take short walks every 1-2 hours. This is important to improve blood flow and breathing. Ask for help if you feel weak or unsteady. Your health care provider may tell you to lie in a certain position (postural drainage) and tap firmly (percuss) over your kidney area to help stone fragments pass. Follow  instructions as told by your health care provider. Return to your normal activities as told by your health care provider. Ask your health care provider what activities are safe for you. Most people can resume normal activities 1-2 days after the procedure. General instructions If told, strain all urine through the strainer that was provided by your health care provider. Keep all fragments for your health care provider to see. Any stones that are found may be sent to a medical lab for examination. The stone may be as small as a grain of salt. Keep all follow-up visits. This is important if you had a stent placed because it may need to stay in place for a few weeks. Ask your health care provider when the stent will be removed. Contact a health care provider if: You have a fever or chills. You have severe nausea that leads to persistent vomiting. You have any of these urinary symptoms: Increased blood or blood clots in the urine. Urine that smells bad or unusual. A strong urge to urinate after emptying your bladder. Pain or burning with urination that does not go away. A continued need to urinate more often than usual. You have a stent, and it comes out. Get help right away if: You have severe pain in your back, sides, or upper abdomen. You faint. You have any of these urinary symptoms: Severe pain while urinating. More blood in your urine, or blood in your urine when you did not have any before. Blood clots in your urine larger than 1 inch (2.5 cm) in size. You pass only a small amount of urine when you urinate or are unable to pass any urine. This information is not intended to replace advice given to you by your health care provider. Make sure you discuss any questions you have with your health care provider. Document Revised: 03/06/2022 Document Reviewed: 03/06/2022 Elsevier Patient Education  [place].

## 2022-09-17 NOTE — Telephone Encounter (Signed)
-----   Message from Ellamae Sia sent at 09/04/2022 10:07 AM EDT ----- Regarding: Lab orders for Thursday, 11.2.23 Patient is scheduled for CPX labs, please order future labs, Thanks , Karna Christmas

## 2022-09-17 NOTE — Progress Notes (Signed)
Established Patient Office Visit  Subjective:  Patient ID: Carolyn Cordova, female    DOB: 1961/11/14  Age: 61 y.o. MRN: [id number]  CC:  Chief Complaint  Patient presents with   Cough    X 5 days has not tested for Covid     HPI Carolyn Cordova is here today with concerns.  Started five days ago with cough, chest congestion, and feeling some chest tightness with it. Cough is non productive. Slight nasal congestion. Had sore throat at the beginning but no longer  Taking otc robitussin with expectorant with only mild relief.  Did have fever yesterday up to 100 F.  Negative covid in office today.      Past Medical History:  Diagnosis Date   Allergy    seasonal   Carpal tunnel syndrome    Cervical disc disease    GERD (gastroesophageal reflux disease)    Hyperlipidemia    Hypertension    Migraine    PMDD (premenstrual dysphoric disorder)     Past Surgical History:  Procedure Laterality Date   CESAREAN SECTION     COLONOSCOPY     CRYOABLATION  11/90   secondary to dysplasia   lipoma removal  2006   PILONIDAL CYST EXCISION  1980s   x2   plantar fasciitis  8/12   surgery with Dr Milinda Pointer   POLYPECTOMY     SPINE SURGERY  03/11   cervical spine surgery    Family History  Problem Relation Age of Onset   Hyperlipidemia Mother    Cancer Mother        breast CA   Breast cancer Mother    Cancer Father        adrenal cancer   Colon polyps Father    Hyperlipidemia Father    Hyperlipidemia Brother    Cancer Cousin        breast CA   Colon cancer Neg Hx    Esophageal cancer Neg Hx    Rectal cancer Neg Hx    Stomach cancer Neg Hx     Social History   Socioeconomic History   Marital status: Widowed    Spouse name: Not on file   Number of children: Not on file   Years of education: Not on file   Highest education level: Not on file  Occupational History   Not on file  Tobacco Use   Smoking status: Every Day    Packs/day: 1.00    Types: Cigarettes    Smokeless tobacco: Never  Vaping Use   Vaping Use: Never used  Substance and Sexual Activity   Alcohol use: No    Alcohol/week: 0.0 standard drinks of alcohol   Drug use: No   Sexual activity: Not Currently  Other Topics Concern   Not on file  Social History Narrative   Not on file   Social Determinants of Health   Financial Resource Strain: Not on file  Food Insecurity: Not on file  Transportation Needs: Not on file  Physical Activity: Not on file  Stress: Not on file  Social Connections: Not on file  Intimate Partner Violence: Not on file    Outpatient Medications Prior to Visit  Medication Sig Dispense Refill   amLODipine (NORVASC) 5 MG tablet TAKE 1 TABLET BY MOUTH EVERY DAY 90 tablet 0   aspirin 81 MG tablet Take 81 mg by mouth daily.     cetirizine (ZYRTEC) 10 MG tablet Take 10 mg by mouth daily.  cevimeline (EVOXAC) 30 MG capsule TAKE 1 CAPSULE BY MOUTH 3 TIMES A DAY 270 capsule 3   rosuvastatin (CRESTOR) 5 MG tablet Take 1 tablet (5 mg total) by mouth daily. 90 tablet 3   No facility-administered medications prior to visit.    Allergies  Allergen Reactions   Adhesive [Tape]    Amoxicillin-Pot Clavulanate     REACTION: rash   Codeine     REACTION: ? reaction   Fexofenadine     REACTION: nausea   Sertraline Hcl     REACTION: sedation   Varenicline Tartrate     REACTION: made her feel sick and did not work   Wellbutrin [Bupropion] Other (See Comments)        Objective:    Physical Exam Constitutional:      General: She is not in acute distress.    Appearance: Normal appearance. She is not ill-appearing.  HENT:     Right Ear: Tympanic membrane normal.     Left Ear: Tympanic membrane normal.     Nose: Nose normal. No congestion or rhinorrhea.     Right Turbinates: Not enlarged or swollen.     Left Turbinates: Not enlarged or swollen.     Right Sinus: No maxillary sinus tenderness or frontal sinus tenderness.     Left Sinus: No maxillary sinus  tenderness or frontal sinus tenderness.     Mouth/Throat:     Mouth: Mucous membranes are moist.     Pharynx: No pharyngeal swelling, oropharyngeal exudate or posterior oropharyngeal erythema.     Tonsils: No tonsillar exudate.  Eyes:     Extraocular Movements: Extraocular movements intact.     Conjunctiva/sclera: Conjunctivae normal.     Pupils: Pupils are equal, round, and reactive to light.  Neck:     Thyroid: No thyroid mass.  Cardiovascular:     Rate and Rhythm: Normal rate and regular rhythm.  Pulmonary:     Effort: Pulmonary effort is normal.     Breath sounds: Normal breath sounds.  Lymphadenopathy:     Cervical:     Right cervical: No superficial cervical adenopathy.    Left cervical: No superficial cervical adenopathy.  Neurological:     Mental Status: She is alert.     BP 110/68   Pulse 77   Temp 98.2 F (36.8 C)   Resp 16   Ht '5\' 9"'$  (1.753 m)   Wt 137 lb 4 oz (62.3 kg)   SpO2 98%   BMI 20.27 kg/m  Wt Readings from Last 3 Encounters:  09/17/22 137 lb 4 oz (62.3 kg)  09/17/22 137 lb (62.1 kg)  02/26/22 142 lb 6.4 oz (64.6 kg)     Health Maintenance Due  Topic Date Due   Zoster Vaccines- Shingrix (1 of 2) Never done   COVID-19 Vaccine (3 - Pfizer series) 05/02/2020   PAP SMEAR-Modifier  09/02/2020   MAMMOGRAM  04/27/2022   INFLUENZA VACCINE  06/17/2022    There are no preventive care reminders to display for this patient.  Lab Results  Component Value Date   TSH 0.58 09/18/2022   Lab Results  Component Value Date   WBC 10.4 09/18/2022   HGB 15.5 (H) 09/18/2022   HCT 45.9 09/18/2022   MCV 92.6 09/18/2022   PLT 277.0 09/18/2022   Lab Results  Component Value Date   NA 143 09/18/2022   K 4.1 09/18/2022   CO2 27 09/18/2022   GLUCOSE 100 (H) 09/18/2022   BUN 17 09/18/2022  CREATININE 0.59 09/18/2022   BILITOT 0.6 09/18/2022   ALKPHOS 70 09/18/2022   AST 12 09/18/2022   ALT 13 09/18/2022   PROT 6.7 09/18/2022   ALBUMIN 4.4 09/18/2022    CALCIUM 9.9 09/18/2022   ANIONGAP 10 02/22/2022   GFR 96.98 09/18/2022   Lab Results  Component Value Date   HGBA1C 5.8 09/18/2022      Assessment & Plan:   Problem List Items Addressed This Visit       Respiratory   Bronchitis    Covid tested in office, negative.  Rx zpack  Rx prednisone 20 mg  Take antibiotic as prescribed. Increase oral fluids. Pt to f/u if sx worsen and or fail to improve in 2-3 days.       Relevant Medications   predniSONE (DELTASONE) 20 MG tablet     Genitourinary   Acute vaginitis - Primary   Other Visit Diagnoses     Exposure to COVID-19 virus       Relevant Orders   POC COVID-19 BinaxNow (Completed)       Meds ordered this encounter  Medications   fluconazole (DIFLUCAN) 150 MG tablet    Sig: Take 1 tablet (150 mg total) by mouth once for 1 dose.    Dispense:  1 tablet    Refill:  0    Order Specific Question:   Supervising Provider    Answer:   BEDSOLE, AMY E [[id number]]   azithromycin (ZITHROMAX) 250 MG tablet    Sig: Take 2 tablets on day 1, then 1 tablet daily on days 2 through 5    Dispense:  6 tablet    Refill:  0    Order Specific Question:   Supervising Provider    Answer:   BEDSOLE, AMY E [[id number]]   predniSONE (DELTASONE) 20 MG tablet    Sig: Take two tablets once daily for five days    Dispense:  10 tablet    Refill:  0    Order Specific Question:   Supervising Provider    Answer:   BEDSOLE, AMY E [[id number]]    Follow-up: No follow-ups on file.    Eugenia Pancoast, FNP

## 2022-09-18 ENCOUNTER — Other Ambulatory Visit (INDEPENDENT_AMBULATORY_CARE_PROVIDER_SITE_OTHER): Payer: BC Managed Care – PPO

## 2022-09-18 DIAGNOSIS — R81 Glycosuria: Secondary | ICD-10-CM | POA: Diagnosis not present

## 2022-09-18 DIAGNOSIS — I10 Essential (primary) hypertension: Secondary | ICD-10-CM | POA: Diagnosis not present

## 2022-09-18 DIAGNOSIS — E7800 Pure hypercholesterolemia, unspecified: Secondary | ICD-10-CM | POA: Diagnosis not present

## 2022-09-18 LAB — CBC WITH DIFFERENTIAL/PLATELET
Basophils Absolute: 0.1 10*3/uL (ref 0.0–0.1)
Basophils Relative: 1 % (ref 0.0–3.0)
Eosinophils Absolute: 0 10*3/uL (ref 0.0–0.7)
Eosinophils Relative: 0.2 % (ref 0.0–5.0)
HCT: 45.9 % (ref 36.0–46.0)
Hemoglobin: 15.5 g/dL — ABNORMAL HIGH (ref 12.0–15.0)
Lymphocytes Relative: 24.3 % (ref 12.0–46.0)
Lymphs Abs: 2.5 10*3/uL (ref 0.7–4.0)
MCHC: 33.8 g/dL (ref 30.0–36.0)
MCV: 92.6 fl (ref 78.0–100.0)
Monocytes Absolute: 0.7 10*3/uL (ref 0.1–1.0)
Monocytes Relative: 6.6 % (ref 3.0–12.0)
Neutro Abs: 7.1 10*3/uL (ref 1.4–7.7)
Neutrophils Relative %: 67.9 % (ref 43.0–77.0)
Platelets: 277 10*3/uL (ref 150.0–400.0)
RBC: 4.95 Mil/uL (ref 3.87–5.11)
RDW: 12.7 % (ref 11.5–15.5)
WBC: 10.4 10*3/uL (ref 4.0–10.5)

## 2022-09-18 LAB — LIPID PANEL
Cholesterol: 160 mg/dL (ref 0–200)
HDL: 53.2 mg/dL (ref >39.00)
LDL Cholesterol: 94 mg/dL (ref 0–99)
NonHDL: 106.89
Total CHOL/HDL Ratio: 3
Triglycerides: 63 mg/dL (ref 0.0–149.0)
VLDL: 12.6 mg/dL (ref 0.0–40.0)

## 2022-09-18 LAB — COMPREHENSIVE METABOLIC PANEL
ALT: 13 U/L (ref 0–35)
AST: 12 U/L (ref 0–37)
Albumin: 4.4 g/dL (ref 3.5–5.2)
Alkaline Phosphatase: 70 U/L (ref 39–117)
BUN: 17 mg/dL (ref 6–23)
CO2: 27 mEq/L (ref 19–32)
Calcium: 9.9 mg/dL (ref 8.4–10.5)
Chloride: 106 mEq/L (ref 96–112)
Creatinine, Ser: 0.59 mg/dL (ref 0.40–1.20)
GFR: 96.98 mL/min (ref >60.00)
Glucose, Bld: 100 mg/dL — ABNORMAL HIGH (ref 70–99)
Potassium: 4.1 mEq/L (ref 3.5–5.1)
Sodium: 143 mEq/L (ref 135–145)
Total Bilirubin: 0.6 mg/dL (ref 0.2–1.2)
Total Protein: 6.7 g/dL (ref 6.0–8.3)

## 2022-09-18 LAB — HEMOGLOBIN A1C: Hgb A1c MFr Bld: 5.8 % (ref 4.6–6.5)

## 2022-09-18 LAB — TSH: TSH: 0.58 u[IU]/mL (ref 0.35–5.50)

## 2022-09-23 DIAGNOSIS — J40 Bronchitis, not specified as acute or chronic: Secondary | ICD-10-CM | POA: Insufficient documentation

## 2022-09-23 DIAGNOSIS — N760 Acute vaginitis: Secondary | ICD-10-CM | POA: Insufficient documentation

## 2022-09-23 NOTE — Assessment & Plan Note (Signed)
Covid tested in office, negative.  Rx zpack  Rx prednisone 20 mg  Take antibiotic as prescribed. Increase oral fluids. Pt to f/u if sx worsen and or fail to improve in 2-3 days.

## 2022-09-25 ENCOUNTER — Ambulatory Visit (INDEPENDENT_AMBULATORY_CARE_PROVIDER_SITE_OTHER): Payer: BC Managed Care – PPO | Admitting: Family Medicine

## 2022-09-25 ENCOUNTER — Encounter: Payer: Self-pay | Admitting: Family Medicine

## 2022-09-25 VITALS — BP 124/76 | HR 45 | Temp 98.3°F | Ht 67.0 in | Wt 137.1 lb

## 2022-09-25 DIAGNOSIS — Z0000 Encounter for general adult medical examination without abnormal findings: Secondary | ICD-10-CM

## 2022-09-25 DIAGNOSIS — F4321 Adjustment disorder with depressed mood: Secondary | ICD-10-CM

## 2022-09-25 DIAGNOSIS — Z23 Encounter for immunization: Secondary | ICD-10-CM | POA: Diagnosis not present

## 2022-09-25 DIAGNOSIS — F17200 Nicotine dependence, unspecified, uncomplicated: Secondary | ICD-10-CM

## 2022-09-25 DIAGNOSIS — R682 Dry mouth, unspecified: Secondary | ICD-10-CM

## 2022-09-25 DIAGNOSIS — Z1231 Encounter for screening mammogram for malignant neoplasm of breast: Secondary | ICD-10-CM

## 2022-09-25 DIAGNOSIS — E7800 Pure hypercholesterolemia, unspecified: Secondary | ICD-10-CM

## 2022-09-25 DIAGNOSIS — I10 Essential (primary) hypertension: Secondary | ICD-10-CM

## 2022-09-25 NOTE — Assessment & Plan Note (Signed)
Reviewed health habits including diet and exercise and skin cancer prevention Reviewed appropriate screening tests for age  Also reviewed health mt list, fam hx and immunization status , as well as social and family history   See HPI Labs reviewed  Mammogram due-pt plans to schedule I recommend dexa due to smoking hx, pt will see if this is covered and then request order  (no falls of fractures) Will check into coverage of shingrix  Flu shot given  Pap is due-pt declines this year  Colonoscopy utd for another year Counseled on smoking cessation  Pt declines grief counseling  Enc ca and D and exercise for bone health

## 2022-09-25 NOTE — Assessment & Plan Note (Signed)
Disc in detail risks of smoking and possible outcomes including copd, vascular/ heart disease, cancer , respiratory and sinus infections  Pt voices understanding She is not ready to quit yet  

## 2022-09-25 NOTE — Progress Notes (Signed)
Subjective:    Patient ID: Carolyn Cordova, female    DOB: Mar 26, 1961, 61 y.o.   MRN: [id number]  HPI Here for health maintenance exam and to review chronic medical problems    Wt Readings from Last 3 Encounters:  09/25/22 137 lb 2 oz (62.2 kg)  09/17/22 137 lb 4 oz (62.3 kg)  09/17/22 137 lb (62.1 kg)   21.48 kg/m  Lost her husband this month  He had colon cancer/ fatty liver, was very sick  In and out of the hospital  She is back to work  Staying busy  Some down days  Stressed on /off   Tries to get out in the yard and work     Declines counseling  Has good support   Daughter lives next door  Apolonio Schneiders is getting married end of dec    Immunization History  Administered Date(s) Administered   H1N1 11/29/2008   Influenza Split 08/26/2011   Influenza Whole 09/11/2005, 08/19/2007, 08/15/2008   Influenza,inj,Quad PF,6+ Mos 08/26/2013, 07/30/2016, 08/05/2017, 09/09/2018, 10/07/2019   PFIZER(Purple Top)SARS-COV-2 Vaccination 02/12/2020, 03/07/2020   Pneumococcal Polysaccharide-23 11/29/2008   Td 08/22/2003   Tdap 06/17/2013   Health Maintenance Due  Topic Date Due   Zoster Vaccines- Shingrix (1 of 2) Never done   COVID-19 Vaccine (3 - Pfizer series) 05/02/2020   PAP SMEAR-Modifier  09/02/2020   MAMMOGRAM  04/27/2022   INFLUENZA VACCINE  06/17/2022   Mammogram 04/2021 at [place]   Would like to go to [facility]  Self breast exam : no lumps  Mother had breast cancer  Shingrix :  interested if covered   Flu shot-today   Pap 08/2018 nl with neg HPV Wants to put that off a year  No gyn problems  Not sexually active   Colonoscopy 09/2018 with 5 y recall - due in a year    Smoking status : not great since she lost husband 1ppd -has cut down  This exp has been motivating   Smoked 18 years at least 1ppd   Bone density  Ca and D -occ  No falls or fx   HTN bp is stable today  No cp or palpitations or headaches or edema  No side effects to medicines  BP  Readings from Last 3 Encounters:  09/25/22 124/76  09/17/22 110/68  09/17/22 114/79    Amlodipine 5 mg daily   Hyperlipidemia Lab Results  Component Value Date   CHOL 160 09/18/2022   CHOL 251 (H) 03/13/2021   CHOL 246 (H) 10/07/2019   Lab Results  Component Value Date   HDL 53.20 09/18/2022   HDL 51.90 03/13/2021   HDL 60 10/07/2019   Lab Results  Component Value Date   LDLCALC 94 09/18/2022   LDLCALC 176 (H) 03/13/2021   LDLCALC 167 (H) 10/07/2019   Lab Results  Component Value Date   TRIG 63.0 09/18/2022   TRIG 113.0 03/13/2021   TRIG 88 10/07/2019   Lab Results  Component Value Date   CHOLHDL 3 09/18/2022   CHOLHDL 5 03/13/2021   CHOLHDL 4.1 10/07/2019   Lab Results  Component Value Date   LDLDIRECT 150.0 08/05/2017   LDLDIRECT 217.5 12/11/2010   LDLDIRECT 185.0 05/10/2009   Is tolerating crestor 5 mg now (could not tol statin in the past)  She eats chicken/fish, much better  Limits beef   The 10-year ASCVD risk score (Arnett DK, et al., 2019) is: 8.1%   Values used to calculate the score:  Age: 39 years     Sex: Female     Is Non-Hispanic African American: No     Diabetic: No     Tobacco smoker: Yes     Systolic Blood Pressure: 259 mmHg     Is BP treated: Yes     HDL Cholesterol: 53.2 mg/dL     Total Cholesterol: 160 mg/dL  Lab Results  Component Value Date   CREATININE 0.59 09/18/2022   BUN 17 09/18/2022   NA 143 09/18/2022   K 4.1 09/18/2022   CL 106 09/18/2022   CO2 27 09/18/2022   Lab Results  Component Value Date   ALT 13 09/18/2022   AST 12 09/18/2022   ALKPHOS 70 09/18/2022   BILITOT 0.6 09/18/2022   Lab Results  Component Value Date   TSH 0.58 09/18/2022    Lab Results  Component Value Date   HGBA1C 5.8 09/18/2022   Lab Results  Component Value Date   WBC 10.4 09/18/2022   HGB 15.5 (H) 09/18/2022   HCT 45.9 09/18/2022   MCV 92.6 09/18/2022   PLT 277.0 09/18/2022    Patient Active Problem List   Diagnosis  Date Noted   Encounter for screening mammogram for breast cancer 09/25/2022   Grief 09/25/2022   Bronchitis 09/23/2022   Glucose found in urine on examination 09/06/2021   Stress reaction 07/30/2016   Internal hemorrhoid 04/10/2015   Routine general medical examination at a health care facility 03/09/2014   H/O herpes zoster 08/09/2012   Dry mouth 04/20/2012   Elevated rheumatoid factor 04/20/2012   Atrophic vaginitis 10/01/2011   HERNIATED CERVICAL DISC 09/14/2009   Pure hypercholesterolemia 05/10/2009   TOBACCO ABUSE 08/19/2007   CARPAL TUNNEL SYNDROME, BILATERAL 08/19/2007   Essential hypertension 08/19/2007   HEMORRHOIDS 08/19/2007   History of cervical dysplasia 08/19/2007   MIGRAINES, HX OF 08/19/2007   Past Medical History:  Diagnosis Date   Allergy    seasonal   Carpal tunnel syndrome    Cervical disc disease    GERD (gastroesophageal reflux disease)    Hyperlipidemia    Hypertension    Migraine    PMDD (premenstrual dysphoric disorder)    Past Surgical History:  Procedure Laterality Date   CESAREAN SECTION     COLONOSCOPY     CRYOABLATION  11/90   secondary to dysplasia   lipoma removal  2006   PILONIDAL CYST EXCISION  1980s   x2   plantar fasciitis  8/12   surgery with Dr Milinda Pointer   POLYPECTOMY     SPINE SURGERY  03/11   cervical spine surgery   Social History   Tobacco Use   Smoking status: Every Day    Packs/day: 1.00    Types: Cigarettes   Smokeless tobacco: Never  Vaping Use   Vaping Use: Never used  Substance Use Topics   Alcohol use: No    Alcohol/week: 0.0 standard drinks of alcohol   Drug use: No   Family History  Problem Relation Age of Onset   Hyperlipidemia Mother    Cancer Mother        breast CA   Breast cancer Mother    Cancer Father        adrenal cancer   Colon polyps Father    Hyperlipidemia Father    Hyperlipidemia Brother    Cancer Cousin        breast CA   Colon cancer Neg Hx    Esophageal cancer Neg Hx    Rectal  cancer Neg Hx    Stomach cancer Neg Hx    Allergies  Allergen Reactions   Adhesive [Tape]    Amoxicillin-Pot Clavulanate     REACTION: rash   Codeine     REACTION: ? reaction   Fexofenadine     REACTION: nausea   Sertraline Hcl     REACTION: sedation   Varenicline Tartrate     REACTION: made her feel sick and did not work   Wellbutrin [Bupropion] Other (See Comments)   Current Outpatient Medications on File Prior to Visit  Medication Sig Dispense Refill   amLODipine (NORVASC) 5 MG tablet TAKE 1 TABLET BY MOUTH EVERY DAY 90 tablet 0   aspirin 81 MG tablet Take 81 mg by mouth daily.     cetirizine (ZYRTEC) 10 MG tablet Take 10 mg by mouth daily.     cevimeline (EVOXAC) 30 MG capsule TAKE 1 CAPSULE BY MOUTH 3 TIMES A DAY 270 capsule 3   rosuvastatin (CRESTOR) 5 MG tablet Take 1 tablet (5 mg total) by mouth daily. 90 tablet 3   No current facility-administered medications on file prior to visit.     Review of Systems  Constitutional:  Positive for fatigue. Negative for activity change, appetite change, fever and unexpected weight change.  HENT:  Negative for congestion, ear pain, rhinorrhea, sinus pressure and sore throat.   Eyes:  Negative for pain, redness and visual disturbance.  Respiratory:  Negative for cough, shortness of breath and wheezing.   Cardiovascular:  Negative for chest pain and palpitations.  Gastrointestinal:  Negative for abdominal pain, blood in stool, constipation and diarrhea.  Endocrine: Negative for polydipsia and polyuria.  Genitourinary:  Negative for dysuria, frequency and urgency.  Musculoskeletal:  Negative for arthralgias, back pain and myalgias.  Skin:  Negative for pallor and rash.  Allergic/Immunologic: Negative for environmental allergies.  Neurological:  Negative for dizziness, syncope and headaches.  Hematological:  Negative for adenopathy. Does not bruise/bleed easily.  Psychiatric/Behavioral:  Positive for dysphoric mood. Negative for  decreased concentration. The patient is not nervous/anxious.        Objective:   Physical Exam Constitutional:      General: She is not in acute distress.    Appearance: Normal appearance. She is well-developed and normal weight. She is not ill-appearing or diaphoretic.  HENT:     Head: Normocephalic and atraumatic.     Right Ear: Tympanic membrane, ear canal and external ear normal.     Left Ear: Tympanic membrane, ear canal and external ear normal.     Nose: Nose normal. No congestion.     Mouth/Throat:     Mouth: Mucous membranes are moist.     Pharynx: Oropharynx is clear. No posterior oropharyngeal erythema.  Eyes:     General: No scleral icterus.    Extraocular Movements: Extraocular movements intact.     Conjunctiva/sclera: Conjunctivae normal.     Pupils: Pupils are equal, round, and reactive to light.  Neck:     Thyroid: No thyromegaly.     Vascular: No carotid bruit or JVD.  Cardiovascular:     Rate and Rhythm: Normal rate and regular rhythm.     Pulses: Normal pulses.     Heart sounds: Normal heart sounds.     No gallop.  Pulmonary:     Effort: Pulmonary effort is normal. No respiratory distress.     Breath sounds: Normal breath sounds. No wheezing.     Comments: Diffusely distant bs  Chest:  Chest wall: No tenderness.  Abdominal:     General: Bowel sounds are normal. There is no distension or abdominal bruit.     Palpations: Abdomen is soft. There is no mass.     Tenderness: There is no abdominal tenderness.     Hernia: No hernia is present.  Genitourinary:    Comments: Breast exam: No mass, nodules, thickening, tenderness, bulging, retraction, inflamation, nipple discharge or skin changes noted.  No axillary or clavicular LA.     Musculoskeletal:        General: No tenderness. Normal range of motion.     Cervical back: Normal range of motion and neck supple. No rigidity. No muscular tenderness.     Right lower leg: No edema.     Left lower leg: No  edema.     Comments: No kyphosis   Lymphadenopathy:     Cervical: No cervical adenopathy.  Skin:    General: Skin is warm and dry.     Coloration: Skin is not pale.     Findings: No erythema or rash.     Comments: Solar lentigines diffusely   Neurological:     Mental Status: She is alert. Mental status is at baseline.     Cranial Nerves: No cranial nerve deficit.     Motor: No abnormal muscle tone.     Coordination: Coordination normal.     Gait: Gait normal.     Deep Tendon Reflexes: Reflexes are normal and symmetric.  Psychiatric:        Mood and Affect: Mood normal.        Cognition and Memory: Cognition and memory normal.     Comments: Seems down  Candidly discusses symptoms and stressors  Not tearful            Assessment & Plan:   Problem List Items Addressed This Visit       Cardiovascular and Mediastinum   Essential hypertension    bp in fair control at this time  BP Readings from Last 1 Encounters:  09/25/22 124/76  No changes needed Most recent labs reviewed  Disc lifstyle change with low sodium diet and exercise  Plan to continue amlodipie 5 mg daily  Enc smoking cessation         Digestive   Dry mouth    Continues evoxac         Other   Encounter for screening mammogram for breast cancer    Pt plans to schedule mammogram  Nl exam today      Relevant Orders   MM 3D SCREEN BREAST BILATERAL   Grief    Lost husband this month (long hx of poor health) She is coping well  Good support Reviewed stressors/ coping techniques/symptoms/ support sources/ tx options and side effects in detail today  Offered counseling-she declined  Urged her to reach out if needed Enc good self care       Pure hypercholesterolemia    Disc goals for lipids and reasons to control them Rev last labs with pt Rev low sat fat diet in detail  Now tolerating crestor 5 mg   Planning f/u with cardiology  ASCVD risk score is 8.1% Strongly enc smoking cessation        Routine general medical examination at a health care facility - Primary    Reviewed health habits including diet and exercise and skin cancer prevention Reviewed appropriate screening tests for age  Also reviewed health mt list, fam hx and immunization status ,  as well as social and family history   See HPI Labs reviewed  Mammogram due-pt plans to schedule I recommend dexa due to smoking hx, pt will see if this is covered and then request order  (no falls of fractures) Will check into coverage of shingrix  Flu shot given  Pap is due-pt declines this year  Colonoscopy utd for another year Counseled on smoking cessation  Pt declines grief counseling  Enc ca and D and exercise for bone health       TOBACCO ABUSE    Disc in detail risks of smoking and possible outcomes including copd, vascular/ heart disease, cancer , respiratory and sinus infections  Pt voices understanding  She is not ready to quit yet       Other Visit Diagnoses     Need for influenza vaccination       Relevant Orders   Flu Vaccine QUAD 6+ mos PF IM (Fluarix Quad PF) (Completed)

## 2022-09-25 NOTE — Patient Instructions (Addendum)
If you are interested in the shingles vaccine series (Shingrix), call your insurance or pharmacy to check on coverage and location it must be given.  If affordable - you can schedule it here or at your pharmacy depending on coverage   Find out if your insurance will pay for a bone density test since you are a smoker  Let me know and I will order it at [facility]   Put calcium and vitamin D in your pill box Try to get 1200-1500 mg of calcium per day with at least 1000 iu of vitamin D - for bone health  I do recommend checking in with cardiology   Flu shot today    Call the [facility] breast center to schedule your mammogram   Please call the location of your choice from the menu below to schedule your Mammogram and/or Bone Density appointment.    [facility]                      Phone:  [phone], [place] [address]                                                             Services: Traditional and 3D Mammogram, [place] [facility]                 Phone: [phone] [address], [place] [address]    Service: Bone Density ONLY   *this site does NOT perform mammograms  [place]                        Phone:  [phone] [address], [place] [address]                                            Services:  3D Mammogram and [place] at [facility]   Phone:  [phone]   [address]  [place], [place] [address]                                            Services: 3D Mammogram and [place]  [place] at [facility]   Phone:  [phone]   [address] Room [place], [place] [address]                                              Services:  3D Mammogram and Bone Density

## 2022-09-25 NOTE — Assessment & Plan Note (Signed)
Continues evoxac

## 2022-09-25 NOTE — Assessment & Plan Note (Signed)
bp in fair control at this time  BP Readings from Last 1 Encounters:  09/25/22 124/76   No changes needed Most recent labs reviewed  Disc lifstyle change with low sodium diet and exercise  Plan to continue amlodipie 5 mg daily  Enc smoking cessation

## 2022-09-25 NOTE — Assessment & Plan Note (Signed)
Disc goals for lipids and reasons to control them Rev last labs with pt Rev low sat fat diet in detail  Now tolerating crestor 5 mg   Planning f/u with cardiology  ASCVD risk score is 8.1% Strongly enc smoking cessation

## 2022-09-25 NOTE — Assessment & Plan Note (Signed)
Pt plans to schedule mammogram  Nl exam today

## 2022-09-25 NOTE — Assessment & Plan Note (Signed)
Lost husband this month (long hx of poor health) She is coping well  Good support Reviewed stressors/ coping techniques/symptoms/ support sources/ tx options and side effects in detail today  Offered counseling-she declined  Urged her to reach out if needed Enc good self care

## 2022-10-02 ENCOUNTER — Ambulatory Visit
Admission: RE | Admit: 2022-10-02 | Discharge: 2022-10-02 | Disposition: A | Discharge status: Home / Self Care | Payer: BC Managed Care – PPO | Attending: Urology | Admitting: Urology

## 2022-10-02 ENCOUNTER — Other Ambulatory Visit: Payer: Self-pay

## 2022-10-02 ENCOUNTER — Encounter
Admission: RE | Disposition: A | Discharge status: Home / Self Care | Payer: Self-pay | Source: Home / Self Care | Attending: Urology

## 2022-10-02 ENCOUNTER — Encounter: Payer: Self-pay | Admitting: Urology

## 2022-10-02 ENCOUNTER — Ambulatory Visit: Payer: BC Managed Care – PPO

## 2022-10-02 DIAGNOSIS — I10 Essential (primary) hypertension: Secondary | ICD-10-CM | POA: Diagnosis not present

## 2022-10-02 DIAGNOSIS — N200 Calculus of kidney: Secondary | ICD-10-CM | POA: Insufficient documentation

## 2022-10-02 DIAGNOSIS — Z7901 Long term (current) use of anticoagulants: Secondary | ICD-10-CM | POA: Insufficient documentation

## 2022-10-02 DIAGNOSIS — F17200 Nicotine dependence, unspecified, uncomplicated: Secondary | ICD-10-CM | POA: Diagnosis not present

## 2022-10-02 DIAGNOSIS — Z7982 Long term (current) use of aspirin: Secondary | ICD-10-CM | POA: Insufficient documentation

## 2022-10-02 DIAGNOSIS — Z01818 Encounter for other preprocedural examination: Secondary | ICD-10-CM | POA: Diagnosis not present

## 2022-10-02 HISTORY — PX: EXTRACORPOREAL SHOCK WAVE LITHOTRIPSY: SHX1557

## 2022-10-02 SURGERY — LITHOTRIPSY, ESWL
Anesthesia: Moderate Sedation | Laterality: Right

## 2022-10-02 MED ORDER — CEPHALEXIN 500 MG PO CAPS: 500.0000 mg | ORAL_CAPSULE | Freq: Once | ORAL | Status: AC

## 2022-10-02 MED ORDER — SODIUM CHLORIDE 0.9 % IV SOLN: INTRAVENOUS | Status: DC

## 2022-10-02 MED ORDER — ONDANSETRON HCL 4 MG/2ML IJ SOLN: 4.0000 mg | Freq: Once | INTRAMUSCULAR | Status: AC

## 2022-10-02 MED ORDER — DIAZEPAM 5 MG PO TABS
ORAL_TABLET | ORAL | Status: AC
  Administered 2022-10-02: 10 mg via ORAL
  Filled 2022-10-02: qty 2

## 2022-10-02 MED ORDER — DIPHENHYDRAMINE HCL 25 MG PO CAPS
ORAL_CAPSULE | ORAL | Status: AC
  Administered 2022-10-02: 25 mg via ORAL
  Filled 2022-10-02: qty 1

## 2022-10-02 MED ORDER — ONDANSETRON HCL 4 MG/2ML IJ SOLN
INTRAMUSCULAR | Status: AC
  Administered 2022-10-02: 4 mg via INTRAVENOUS
  Filled 2022-10-02: qty 2

## 2022-10-02 MED ORDER — DIPHENHYDRAMINE HCL 25 MG PO CAPS: 25.0000 mg | ORAL_CAPSULE | ORAL | Status: AC

## 2022-10-02 MED ORDER — DIAZEPAM 5 MG PO TABS: 10.0000 mg | ORAL_TABLET | ORAL | Status: AC

## 2022-10-02 MED ORDER — CEPHALEXIN 500 MG PO CAPS
ORAL_CAPSULE | ORAL | Status: AC
  Administered 2022-10-02: 500 mg via ORAL
  Filled 2022-10-02: qty 1

## 2022-10-16 ENCOUNTER — Other Ambulatory Visit: Payer: Self-pay

## 2022-10-16 ENCOUNTER — Encounter: Payer: Self-pay | Admitting: Urology

## 2022-10-16 ENCOUNTER — Encounter: Payer: Self-pay | Admitting: Family

## 2022-10-16 ENCOUNTER — Ambulatory Visit: Payer: BC Managed Care – PPO

## 2022-10-16 ENCOUNTER — Ambulatory Visit
Admission: RE | Admit: 2022-10-16 | Discharge: 2022-10-16 | Disposition: A | Discharge status: Home / Self Care | Payer: BC Managed Care – PPO | Attending: Urology | Admitting: Urology

## 2022-10-16 ENCOUNTER — Encounter
Admission: RE | Disposition: A | Discharge status: Home / Self Care | Payer: Self-pay | Source: Home / Self Care | Attending: Urology

## 2022-10-16 DIAGNOSIS — I10 Essential (primary) hypertension: Secondary | ICD-10-CM | POA: Insufficient documentation

## 2022-10-16 DIAGNOSIS — N200 Calculus of kidney: Secondary | ICD-10-CM | POA: Diagnosis not present

## 2022-10-16 DIAGNOSIS — I499 Cardiac arrhythmia, unspecified: Secondary | ICD-10-CM | POA: Insufficient documentation

## 2022-10-16 HISTORY — PX: EXTRACORPOREAL SHOCK WAVE LITHOTRIPSY: SHX1557

## 2022-10-16 SURGERY — LITHOTRIPSY, ESWL
Anesthesia: Moderate Sedation | Laterality: Right

## 2022-10-16 MED ORDER — DIPHENHYDRAMINE HCL 25 MG PO CAPS
ORAL_CAPSULE | ORAL | Status: AC
  Administered 2022-10-16: 25 mg via ORAL
  Filled 2022-10-16: qty 1

## 2022-10-16 MED ORDER — CEPHALEXIN 500 MG PO CAPS: 500.0000 mg | ORAL_CAPSULE | Freq: Once | ORAL | Status: AC

## 2022-10-16 MED ORDER — SODIUM CHLORIDE 0.9 % IV SOLN: INTRAVENOUS | Status: DC

## 2022-10-16 MED ORDER — ONDANSETRON HCL 4 MG/2ML IJ SOLN
INTRAMUSCULAR | Status: AC
  Administered 2022-10-16: 4 mg via INTRAVENOUS
  Filled 2022-10-16: qty 2

## 2022-10-16 MED ORDER — DIAZEPAM 5 MG PO TABS: 10.0000 mg | ORAL_TABLET | ORAL | Status: AC

## 2022-10-16 MED ORDER — CEPHALEXIN 500 MG PO CAPS
ORAL_CAPSULE | ORAL | Status: AC
  Administered 2022-10-16: 500 mg via ORAL
  Filled 2022-10-16: qty 1

## 2022-10-16 MED ORDER — DIAZEPAM 5 MG PO TABS
ORAL_TABLET | ORAL | Status: AC
  Administered 2022-10-16: 10 mg via ORAL
  Filled 2022-10-16: qty 2

## 2022-10-16 MED ORDER — ONDANSETRON HCL 4 MG/2ML IJ SOLN: 4.0000 mg | Freq: Once | INTRAMUSCULAR | Status: AC

## 2022-10-16 MED ORDER — HYDROCODONE-ACETAMINOPHEN 5-325 MG PO TABS: 1.0000 | ORAL_TABLET | Freq: Four times a day (QID) | ORAL | 0 refills | Status: DC | PRN

## 2022-10-16 MED ORDER — DIPHENHYDRAMINE HCL 25 MG PO CAPS: 25.0000 mg | ORAL_CAPSULE | ORAL | Status: AC

## 2022-10-16 NOTE — Interval H&P Note (Signed)
History and Physical Interval Note:  10/16/2022 8:19 AM  Carolyn Cordova  has presented today for surgery, with the diagnosis of Right Nephrolithiasis.  The various methods of treatment have been discussed with the patient and family. After consideration of risks, benefits and other options for treatment, the patient has consented to  Procedure(s): EXTRACORPOREAL SHOCK WAVE LITHOTRIPSY (ESWL) (Right) as a surgical intervention.  The patient's history has been reviewed, patient examined, no change in status, stable for surgery.  I have reviewed the patient's chart and labs.  Questions were answered to the patient's satisfaction.    RRR CTAB   Hollice Espy

## 2022-10-16 NOTE — Discharge Instructions (Addendum)
See [facility] discharge instructions in chart. AMBULATORY SURGERY  DISCHARGE INSTRUCTIONS   The drugs that you were given will stay in your system until tomorrow so for the next 24 hours you should not:  Drive an automobile Make any legal decisions Drink any alcoholic beverage   You may resume regular meals tomorrow.  Today it is better to start with liquids and gradually work up to solid foods.  You may eat anything you prefer, but it is better to start with liquids, then soup and crackers, and gradually work up to solid foods.   Please notify your doctor immediately if you have any unusual bleeding, trouble breathing, redness and pain at the surgery site, drainage, fever, or pain not relieved by medication.    Additional Instructions:        Please contact your physician with any problems or Same Day Surgery at [phone], Monday through Friday 6 am to 4 pm, or [facility] Main number at [phone].  

## 2022-10-17 ENCOUNTER — Other Ambulatory Visit: Payer: Self-pay

## 2022-10-17 DIAGNOSIS — N200 Calculus of kidney: Secondary | ICD-10-CM

## 2022-10-21 ENCOUNTER — Ambulatory Visit: Payer: BC Managed Care – PPO | Admitting: Urology

## 2022-10-23 ENCOUNTER — Ambulatory Visit (INDEPENDENT_AMBULATORY_CARE_PROVIDER_SITE_OTHER): Payer: BC Managed Care – PPO | Admitting: Physician Assistant

## 2022-10-23 ENCOUNTER — Encounter: Payer: Self-pay | Admitting: Physician Assistant

## 2022-10-23 VITALS — BP 143/95 | HR 82 | Temp 97.8°F | Ht 67.0 in | Wt 137.0 lb

## 2022-10-23 DIAGNOSIS — R350 Frequency of micturition: Secondary | ICD-10-CM

## 2022-10-23 DIAGNOSIS — M549 Dorsalgia, unspecified: Secondary | ICD-10-CM | POA: Diagnosis not present

## 2022-10-23 DIAGNOSIS — Z87442 Personal history of urinary calculi: Secondary | ICD-10-CM

## 2022-10-23 DIAGNOSIS — R300 Dysuria: Secondary | ICD-10-CM

## 2022-10-23 DIAGNOSIS — R399 Unspecified symptoms and signs involving the genitourinary system: Secondary | ICD-10-CM

## 2022-10-23 DIAGNOSIS — N200 Calculus of kidney: Secondary | ICD-10-CM

## 2022-10-23 LAB — URINALYSIS, COMPLETE
Bilirubin, UA: NEGATIVE
Glucose, UA: NEGATIVE
Ketones, UA: NEGATIVE
Leukocytes,UA: NEGATIVE
Nitrite, UA: NEGATIVE
Protein,UA: NEGATIVE
Specific Gravity, UA: 1.025 (ref 1.005–1.030)
Urobilinogen, Ur: 0.2 mg/dL (ref 0.2–1.0)
pH, UA: 6 (ref 5.0–7.5)

## 2022-10-23 LAB — MICROSCOPIC EXAMINATION

## 2022-10-23 MED ORDER — HYDROCODONE-ACETAMINOPHEN 5-325 MG PO TABS: 1.0000 | ORAL_TABLET | ORAL | 0 refills | Status: DC | PRN

## 2022-10-23 MED ORDER — TAMSULOSIN HCL 0.4 MG PO CAPS: 0.4000 mg | ORAL_CAPSULE | Freq: Every day | ORAL | 0 refills | Status: DC

## 2022-10-23 NOTE — Progress Notes (Signed)
10/23/2022 10:40 AM   Carolyn Cordova 03/06/1961 [id number]  CC: Chief Complaint  Patient presents with   Urinary Tract Infection   HPI: Carolyn Cordova is a 61 y.o. female with PMH nephrolithiasis who underwent ESWL with Dr. Erlene Quan 7 days ago for management of a 7 mm right renal stone who presents today for evaluation of possible UTI.   Today she reports bilateral back pain, 6/10 in intensity, gross hematuria which has since resolved, frequency, and dysuria. She has passed 2 punctate fragments, which she brings with her to clinic today. She denies fever, chills, nausea, and vomiting.   She is no longer taking Flomax and has not taken any medication for her pain so far. She still has a few Norco left over.  In-office UA today positive for trace intact blood; urine microscopy with moderate bacteria.  PMH: Past Medical History:  Diagnosis Date   Allergy    seasonal   Carpal tunnel syndrome    Cervical disc disease    GERD (gastroesophageal reflux disease)    Hyperlipidemia    Hypertension    Migraine    PMDD (premenstrual dysphoric disorder)     Surgical History: Past Surgical History:  Procedure Laterality Date   CESAREAN SECTION     COLONOSCOPY     CRYOABLATION  11/90   secondary to dysplasia   EXTRACORPOREAL SHOCK WAVE LITHOTRIPSY Right 10/02/2022   Procedure: EXTRACORPOREAL SHOCK WAVE LITHOTRIPSY (ESWL);  Surgeon: Abbie Sons, MD;  Location: [facility] ORS;  Service: Urology;  Laterality: Right;   EXTRACORPOREAL SHOCK WAVE LITHOTRIPSY Right 10/16/2022   Procedure: EXTRACORPOREAL SHOCK WAVE LITHOTRIPSY (ESWL);  Surgeon: Hollice Espy, MD;  Location: [facility] ORS;  Service: Urology;  Laterality: Right;   lipoma removal  2006   PILONIDAL CYST EXCISION  1980s   x2   plantar fasciitis  8/12   surgery with Dr Milinda Pointer   POLYPECTOMY     SPINE SURGERY  03/11   cervical spine surgery    Home Medications:  Allergies as of 10/23/2022       Reactions   Adhesive [tape]     Amoxicillin-pot Clavulanate    REACTION: rash   Codeine    REACTION: ? reaction   Fexofenadine    REACTION: nausea   Sertraline Hcl    REACTION: sedation   Varenicline Tartrate    REACTION: made her feel sick and did not work   Wellbutrin [bupropion] Other (See Comments)        Medication List        Accurate as of October 23, 2022 10:40 AM. If you have any questions, ask your nurse or doctor.          amLODipine 5 MG tablet Commonly known as: NORVASC TAKE 1 TABLET BY MOUTH EVERY DAY   aspirin 81 MG tablet Take 81 mg by mouth daily.   cetirizine 10 MG tablet Commonly known as: ZYRTEC Take 10 mg by mouth daily.   cevimeline 30 MG capsule Commonly known as: EVOXAC TAKE 1 CAPSULE BY MOUTH 3 TIMES A DAY   HYDROcodone-acetaminophen 5-325 MG tablet Commonly known as: NORCO/VICODIN Take 1-2 tablets by mouth every 6 (six) hours as needed for moderate pain.   rosuvastatin 5 MG tablet Commonly known as: CRESTOR Take 1 tablet (5 mg total) by mouth daily.        Allergies:  Allergies  Allergen Reactions   Adhesive [Tape]    Amoxicillin-Pot Clavulanate     REACTION: rash   Codeine  REACTION: ? reaction   Fexofenadine     REACTION: nausea   Sertraline Hcl     REACTION: sedation   Varenicline Tartrate     REACTION: made her feel sick and did not work   Wellbutrin [Bupropion] Other (See Comments)    Family History: Family History  Problem Relation Age of Onset   Hyperlipidemia Mother    Cancer Mother        breast CA   Breast cancer Mother    Cancer Father        adrenal cancer   Colon polyps Father    Hyperlipidemia Father    Hyperlipidemia Brother    Cancer Cousin        breast CA   Colon cancer Neg Hx    Esophageal cancer Neg Hx    Rectal cancer Neg Hx    Stomach cancer Neg Hx     Social History:   reports that she has been smoking cigarettes. She has been smoking an average of 1 pack per day. She has never used smokeless tobacco. She  reports that she does not drink alcohol and does not use drugs.  Physical Exam: BP (!) 143/95 (BP Location: Left Arm, Patient Position: Sitting, Cuff Size: Normal)   Pulse 82   Temp 97.8 F (36.6 C) (Oral)   Ht '5\' 7"'$  (1.702 m)   BMI 21.48 kg/m   Constitutional:  Alert and oriented, no acute distress, nontoxic appearing HEENT: [place], AT Cardiovascular: No clubbing, cyanosis, or edema Respiratory: Normal respiratory effort, no increased work of breathing Skin: No rashes, bruises or suspicious lesions Neurologic: Grossly intact, no focal deficits, moving all 4 extremities Psychiatric: Normal mood and affect  Laboratory Data: Results for orders placed or performed in visit on 10/23/22  Microscopic Examination   Urine  Result Value Ref Range   WBC, UA 0-5 0 - 5 /hpf   RBC, Urine 0-2 0 - 2 /hpf   Epithelial Cells (non renal) 0-10 0 - 10 /hpf   Mucus, UA Present (A) Not Estab.   Bacteria, UA Moderate (A) None seen/Few  Urinalysis, Complete  Result Value Ref Range   Specific Gravity, UA 1.025 1.005 - 1.030   pH, UA 6.0 5.0 - 7.5   Color, UA Yellow Yellow   Appearance Ur Clear Clear   Leukocytes,UA Negative Negative   Protein,UA Negative Negative/Trace   Glucose, UA Negative Negative   Ketones, UA Negative Negative   RBC, UA Trace (A) Negative   Bilirubin, UA Negative Negative   Urobilinogen, Ur 0.2 0.2 - 1.0 mg/dL   Nitrite, UA Negative Negative   Microscopic Examination See below:    Assessment & Plan:   1. UTI symptoms UA bland, low concern for UTI. Symptoms more consistent with renal colic 2/2 stone fragment passage s/p ESWL 1 week ago. Counseled her to push fluids, restart Flomax, and refilling Norco. Will see her back later this month for scheduled post-ESWL follow-up with KUB prior. We discussed return precautions today including fever, chills, nausea, vomiting, and uncontrolled pain. Will send fragments for analysis as well. - Urinalysis, Complete - Calculi, with  Photograph (to Clinical Lab) - HYDROcodone-acetaminophen (NORCO/VICODIN) 5-325 MG tablet; Take 1-2 tablets by mouth every 4 (four) hours as needed for moderate pain or severe pain.  Dispense: 10 tablet; Refill: 0 - tamsulosin (FLOMAX) 0.4 MG CAPS capsule; Take 1 capsule (0.4 mg total) by mouth daily.  Dispense: 30 capsule; Refill: 0   Return for Scheduled follow up with me later  this month.  Debroah Loop, PA-C  [facility] [address] [place], [place] [address] [phone]

## 2022-10-23 NOTE — Patient Instructions (Addendum)
Please keep your scheduled follow up with me later this month with a X-ray prior (you may go get it right before your appointment. In the meantime, please do the following: -Resume Flomax 0.'4mg'$  daily (sent to your pharmacy) -Stay well hydrated -Treat any pain with ibuprofen/tylenol or Norco (refilled today)  Please call our office immediately (we are open 8a-5p Monday-Friday) or go to the Emergency Department if you develop any of the following: -Fever/chills -Nausea and/or vomiting -Pain uncontrollable with Norco

## 2022-10-31 ENCOUNTER — Other Ambulatory Visit: Payer: Self-pay | Admitting: Nurse Practitioner

## 2022-10-31 ENCOUNTER — Ambulatory Visit (INDEPENDENT_AMBULATORY_CARE_PROVIDER_SITE_OTHER): Payer: BC Managed Care – PPO | Admitting: Nurse Practitioner

## 2022-10-31 ENCOUNTER — Encounter: Payer: Self-pay | Admitting: Nurse Practitioner

## 2022-10-31 VITALS — BP 118/80 | HR 85 | Temp 98.3°F | Ht 67.0 in | Wt 138.0 lb

## 2022-10-31 DIAGNOSIS — F4321 Adjustment disorder with depressed mood: Secondary | ICD-10-CM | POA: Diagnosis not present

## 2022-10-31 DIAGNOSIS — F419 Anxiety disorder, unspecified: Secondary | ICD-10-CM

## 2022-10-31 DIAGNOSIS — F430 Acute stress reaction: Secondary | ICD-10-CM

## 2022-10-31 DIAGNOSIS — F32A Depression, unspecified: Secondary | ICD-10-CM | POA: Insufficient documentation

## 2022-10-31 MED ORDER — FLUOXETINE HCL 10 MG PO CAPS: 10.0000 mg | ORAL_CAPSULE | Freq: Every day | ORAL | 0 refills | Status: DC

## 2022-10-31 MED ORDER — FLUOXETINE HCL 20 MG PO CAPS: 20.0000 mg | ORAL_CAPSULE | Freq: Every day | ORAL | 0 refills | Status: DC

## 2022-10-31 MED ORDER — FLUOXETINE HCL 10 MG PO TABS: ORAL_TABLET | ORAL | 0 refills | Status: DC

## 2022-10-31 NOTE — Assessment & Plan Note (Signed)
Patient does have support of her daughter who lives very close by second daughter who is a few hours away that will visit monthly.  Patient has thought about counseling but states she does not think she has time for that.  Patient denies HI/SI/AVH.

## 2022-10-31 NOTE — Assessment & Plan Note (Signed)
PHQ-9 and GAD-7 administered in office.  Both scores positive.  Patient does have overlapping symptoms we will start patient on fluoxetine 10 mg nightly for 2 weeks then titrate to 20 mg nightly thereafter.  Patient has tried Zoloft in the past and cause sedation.  Will have patient follow-up with Dr. Glori Bickers in 4 weeks for reevaluation.  Patient is considering stepping away from her job permanently due to the stress currently she supposed to have a meeting with her employer on Monday did inform patient to think about that and to consider FMLA to take a set amount of time away deceiving get symptoms under control and then go back to work.  This was written down on patient's after visit summary.  If she does file for FMLA she will bring the papers in office and I will fill them out.

## 2022-10-31 NOTE — Progress Notes (Signed)
Acute Office Visit  Subjective:     Patient ID: Carolyn Cordova, female    DOB: 01/24/1961, 61 y.o.   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 Patient reports no nervous/anxious behavior or suicidal ideas.     Patient is in today for Anxiety  States she has a daughter that lives next door and a daughter that lives in [place]. States that the closest one does spend time with her. States that she is working. States that her employer has noticed a change. States that it is seeping into the professional life  States that she is bathing and doing the clothes maintaining the house. Hygiene is good in office.  States that she is able to go to sleep but only gets a few hours and will wake up and her mind will be running about all the things. States that her husband did manage the finances and she found out about credit card State she has considered therapy but she does not feel like she doesn't have time     10/31/2022   11:05 AM 09/25/2022    9:17 AM 03/13/2021    8:58 AM  PHQ9 SCORE ONLY  PHQ-9 Total Score '18 5 7       '$ 10/31/2022   11:07 AM  GAD 7 : Generalized Anxiety Score  Nervous, Anxious, on Edge 3  Control/stop worrying 3  Worry too much - different things 3  Trouble relaxing 3  Restless 3  Easily annoyed or irritable 0  Afraid - awful might happen 0  Total GAD 7 Score 15  Anxiety Difficulty Very difficult      Review of Systems  Constitutional:  Negative for chills and fever.  Cardiovascular:        Chest tightness "+"  Psychiatric/Behavioral:  Negative for hallucinations and suicidal ideas. The patient has insomnia. The patient is not nervous/anxious.         Objective:    BP 118/80   Pulse 85   Temp 98.3 F (36.8 C) (Oral)   Ht '5\' 7"'$  (1.702 m)   Wt 138 lb (62.6 kg)    SpO2 99%   BMI 21.61 kg/m    Physical Exam Vitals and nursing note reviewed.  Constitutional:      Appearance: Normal appearance.  Cardiovascular:     Rate and Rhythm: Normal rate and regular rhythm.     Heart sounds: Normal heart sounds.  Pulmonary:     Effort: Pulmonary effort is normal.     Breath sounds: Normal breath sounds.  Neurological:     Mental Status: She is alert.  Psychiatric:        Attention and Perception: Attention normal.        Mood and Affect: Mood normal.        Speech: Speech normal.        Behavior: Behavior normal.        Cognition and Memory: Cognition normal.     No results found for any visits on 10/31/22.      Assessment & Plan:   Problem List Items Addressed This Visit       Other   Stress reaction    After a lot of stress as of late.  Patient's husband recently passed away in 09-16-2023.  Found out those credit  card that that she was unaware of.  Having lots of stress with her job and has been having subpar performance per her report due to this.      Relevant Medications   FLUoxetine (PROZAC) 10 MG tablet   Grief    Patient does have support of her daughter who lives very close by second daughter who is a few hours away that will visit monthly.  Patient has thought about counseling but states she does not think she has time for that.  Patient denies HI/SI/AVH.      Anxiety and depression - Primary    PHQ-9 and GAD-7 administered in office.  Both scores positive.  Patient does have overlapping symptoms we will start patient on fluoxetine 10 mg nightly for 2 weeks then titrate to 20 mg nightly thereafter.  Patient has tried Zoloft in the past and cause sedation.  Will have patient follow-up with Dr. Glori Bickers in 4 weeks for reevaluation.  Patient is considering stepping away from her job permanently due to the stress currently she supposed to have a meeting with her employer on Monday did inform patient to think about that and to consider FMLA  to take a set amount of time away deceiving get symptoms under control and then go back to work.  This was written down on patient's after visit summary.  If she does file for FMLA she will bring the papers in office and I will fill them out.      Relevant Medications   FLUoxetine (PROZAC) 10 MG tablet    Meds ordered this encounter  Medications   FLUoxetine (PROZAC) 10 MG tablet    Sig: Take 1 tablet (10 mg total) by mouth daily for 15 days, THEN 2 tablets (20 mg total) daily for 15 days.    Dispense:  45 tablet    Refill:  0    Order Specific Question:   Supervising Provider    Answer:   Loura Pardon A [[id number]]   38 minutes was spent face to face with the patient discussing treatment decisions and counseling   Return in about 4 weeks (around 11/28/2022) for Anxiety and depressoin. with Dr. Glori Bickers.  Romilda Garret, NP

## 2022-10-31 NOTE — Assessment & Plan Note (Signed)
After a lot of stress as of late.  Patient's husband recently passed away in 09/29/2023.  Found out those credit card that that she was unaware of.  Having lots of stress with her job and has been having subpar performance per her report due to this.

## 2022-10-31 NOTE — Patient Instructions (Signed)
Nice to see you today I would ask about taking Medical leave from the job [employer]). If that is what you decide to do you can ask to make sure that your insurance will still be active and in place. I will need to fill out the forms for you Follow up with Dr. Glori Bickers in 4 weeks, sooner if you need [place]

## 2022-11-02 LAB — CALCULI, WITH PHOTOGRAPH (CLINICAL LAB)
Calcium Oxalate Dihydrate: 70 %
Calcium Oxalate Monohydrate: 30 %
Weight Calculi: 3 mg

## 2022-11-03 ENCOUNTER — Ambulatory Visit
Admission: RE | Admit: 2022-11-03 | Discharge: 2022-11-03 | Disposition: A | Discharge status: Home / Self Care | Payer: BC Managed Care – PPO | Source: Ambulatory Visit | Attending: Urology | Admitting: Urology

## 2022-11-03 DIAGNOSIS — R109 Unspecified abdominal pain: Secondary | ICD-10-CM | POA: Diagnosis not present

## 2022-11-03 DIAGNOSIS — N200 Calculus of kidney: Secondary | ICD-10-CM | POA: Insufficient documentation

## 2022-11-04 ENCOUNTER — Ambulatory Visit (INDEPENDENT_AMBULATORY_CARE_PROVIDER_SITE_OTHER): Payer: BC Managed Care – PPO | Admitting: Physician Assistant

## 2022-11-04 ENCOUNTER — Telehealth: Payer: Self-pay | Admitting: Family Medicine

## 2022-11-04 ENCOUNTER — Encounter: Payer: Self-pay | Admitting: Physician Assistant

## 2022-11-04 VITALS — BP 115/66 | HR 48 | Ht 67.0 in | Wt 134.0 lb

## 2022-11-04 DIAGNOSIS — N200 Calculus of kidney: Secondary | ICD-10-CM

## 2022-11-04 LAB — URINALYSIS, COMPLETE
Bilirubin, UA: NEGATIVE
Glucose, UA: NEGATIVE
Leukocytes,UA: NEGATIVE
Nitrite, UA: NEGATIVE
RBC, UA: NEGATIVE
Specific Gravity, UA: 1.02 (ref 1.005–1.030)
Urobilinogen, Ur: 1 mg/dL (ref 0.2–1.0)
pH, UA: 6 (ref 5.0–7.5)

## 2022-11-04 LAB — MICROSCOPIC EXAMINATION: Epithelial Cells (non renal): >10 /hpf — AB (ref 0–10)

## 2022-11-04 NOTE — Telephone Encounter (Signed)
Spoke to patient by telephone and was advised when she was in last and saw Romilda Garret NP it was mentioned that she would possibly qualify for FMLA. Patient stated that she is wanting to pursue this because of her anxiety and stress at work. Patient was advised to drop the paperwork and Catalina Antigua will review it. Patient stated that she will request the paperwork from her employer and drop it off.Patient was advised that there is typically a charge for completing the paperwork and she verbalized understanding. Sending message to Romilda Garret NP and patient's PCP Dr. Glori Bickers

## 2022-11-04 NOTE — Progress Notes (Signed)
11/04/2022 10:03 AM   Carolyn Cordova 05/04/1961 [id number]  CC: Chief Complaint  Patient presents with   Nephrolithiasis   HPI: Carolyn Cordova is a 61 y.o. female with PMH nephrolithiasis who underwent right ESWL with Carolyn. Erlene Cordova on 10/16/2022 for management of a 45m right renal stone who presents today for postop follow-up.   I saw her in clinic 1 week after ESWL at which point she had passed 2 punctate stone fragments.  These resulted with 30% calcium oxalate monohydrate and 70% calcium oxalate dihydrate.  Today she reports she did not pass any more stone fragments.  She has intermittent, symmetric low back pain.  KUB today shows her right renal stone has fragmented into 2-3 smaller pieces, which remain in the right lower pole.  In-office UA today positive for trace ketones and 1+ protein; urine microscopy with >10 with ileal cells/hpf, granular casts, and moderate bacteria.   PMH: Past Medical History:  Diagnosis Date   Allergy    seasonal   Carpal tunnel syndrome    Cervical disc disease    GERD (gastroesophageal reflux disease)    Hyperlipidemia    Hypertension    Migraine    PMDD (premenstrual dysphoric disorder)     Surgical History: Past Surgical History:  Procedure Laterality Date   CESAREAN SECTION     COLONOSCOPY     CRYOABLATION  11/90   secondary to dysplasia   EXTRACORPOREAL SHOCK WAVE LITHOTRIPSY Right 10/02/2022   Procedure: EXTRACORPOREAL SHOCK WAVE LITHOTRIPSY (ESWL);  Surgeon: Carolyn Sons MD;  Location: [facility] ORS;  Service: Urology;  Laterality: Right;   EXTRACORPOREAL SHOCK WAVE LITHOTRIPSY Right 10/16/2022   Procedure: EXTRACORPOREAL SHOCK WAVE LITHOTRIPSY (ESWL);  Surgeon: Carolyn Espy MD;  Location: [facility] ORS;  Service: Urology;  Laterality: Right;   lipoma removal  2006   PILONIDAL CYST EXCISION  1980s   x2   plantar fasciitis  8/12   surgery with Carolyn Cordova  POLYPECTOMY     SPINE SURGERY  03/11   cervical spine surgery     Home Medications:  Allergies as of 11/04/2022       Reactions   Adhesive [tape]    Amoxicillin-pot Clavulanate    REACTION: rash   Codeine    REACTION: ? reaction   Fexofenadine    REACTION: nausea   Sertraline Hcl    REACTION: sedation   Varenicline Tartrate    REACTION: made her feel sick and did not work   Wellbutrin [bupropion] Other (See Comments)        Medication List        Accurate as of November 04, 2022 10:03 AM. If you have any questions, ask your nurse or doctor.          STOP taking these medications    HYDROcodone-acetaminophen 5-325 MG tablet Commonly known as: NORCO/VICODIN Stopped by: Carolyn Cordova       TAKE these medications    amLODipine 5 MG tablet Commonly known as: NORVASC TAKE 1 TABLET BY MOUTH EVERY DAY   aspirin 81 MG tablet Take 81 mg by mouth daily.   cetirizine 10 MG tablet Commonly known as: ZYRTEC Take 10 mg by mouth daily.   cevimeline 30 MG capsule Commonly known as: EVOXAC TAKE 1 CAPSULE BY MOUTH 3 TIMES A DAY   FLUoxetine 10 MG capsule Commonly known as: PROZAC Take 1 capsule (10 mg total) by mouth at bedtime. For 15 days then start the '20mg'$  capsule   FLUoxetine  20 MG capsule Commonly known as: PROZAC Take 1 capsule (20 mg total) by mouth daily. After you have finished 15 days of the '10mg'$  capsule   rosuvastatin 5 MG tablet Commonly known as: CRESTOR Take 1 tablet (5 mg total) by mouth daily.   tamsulosin 0.4 MG Caps capsule Commonly known as: FLOMAX Take 1 capsule (0.4 mg total) by mouth daily.        Allergies:  Allergies  Allergen Reactions   Adhesive [Tape]    Amoxicillin-Pot Clavulanate     REACTION: rash   Codeine     REACTION: ? reaction   Fexofenadine     REACTION: nausea   Sertraline Hcl     REACTION: sedation   Varenicline Tartrate     REACTION: made her feel sick and did not work   Wellbutrin [Bupropion] Other (See Comments)    Family History: Family History   Problem Relation Age of Onset   Hyperlipidemia Mother    Cancer Mother        breast CA   Breast cancer Mother    Cancer Father        adrenal cancer   Colon polyps Father    Hyperlipidemia Father    Hyperlipidemia Brother    Cancer Cousin        breast CA   Colon cancer Neg Hx    Esophageal cancer Neg Hx    Rectal cancer Neg Hx    Stomach cancer Neg Hx     Social History:   reports that she has been smoking cigarettes. She has been smoking an average of 1 pack per day. She has never used smokeless tobacco. She reports that she does not drink alcohol and does not use drugs.  Physical Exam: BP 115/66   Pulse (!) 48   Ht '5\' 7"'$  (1.702 m)   Wt 134 lb (60.8 kg)   BMI 20.99 kg/m   Constitutional:  Alert and oriented, no acute distress, nontoxic appearing HEENT: [place], AT Cardiovascular: No clubbing, cyanosis, or edema Respiratory: Normal respiratory effort, no increased work of breathing GU: No CVA tenderness Skin: No rashes, bruises or suspicious lesions Neurologic: Grossly intact, no focal deficits, moving all 4 extremities Psychiatric: Normal mood and affect  Laboratory Data: Results for orders placed or performed in visit on 11/04/22  Microscopic Examination   Urine  Result Value Ref Range   WBC, UA 0-5 0 - 5 /hpf   RBC, Urine 0-2 0 - 2 /hpf   Epithelial Cells (non renal) >10 (A) 0 - 10 /hpf   Casts Present (A) None seen /lpf   Cast Type Granular casts (A) N/A   Mucus, UA Present (A) Not Estab.   Bacteria, UA Moderate (A) None seen/Few  Urinalysis, Complete  Result Value Ref Range   Specific Gravity, UA 1.020 1.005 - 1.030   pH, UA 6.0 5.0 - 7.5   Color, UA Yellow Yellow   Appearance Ur Clear Clear   Leukocytes,UA Negative Negative   Protein,UA 1+ (A) Negative/Trace   Glucose, UA Negative Negative   Ketones, UA Trace (A) Negative   RBC, UA Negative Negative   Bilirubin, UA Negative Negative   Urobilinogen, Ur 1.0 0.2 - 1.0 mg/dL   Nitrite, UA Negative  Negative   Microscopic Examination See below:    Pertinent Imaging: Results for orders placed during the hospital encounter of 11/03/22  Abdomen 1 view (KUB)  Narrative CLINICAL DATA:  Bilateral flank pain, month. Pain mostly on the right. History of  lithotripsy on 10/02/2022 and 10/16/2022.  EXAM: ABDOMEN - 1 VIEW  COMPARISON:  10/16/2022, 10/02/2022 and CT dated 02/22/2022.  FINDINGS: Stone in the right kidney noted previously is unchanged. No other evidence of a renal stone. No evidence of a ureteral stone. Small stable vascular calcifications noted in the pelvis.  Soft tissues are otherwise unremarkable. Normal bowel gas pattern. No significant skeletal abnormality.  IMPRESSION: 1. No acute findings. 2. Stable right intrarenal stone.  No evidence of a ureteral stone.   Electronically Signed By: Lajean Manes M.D. On: 11/03/2022 14:32  I personally reviewed the images referenced above and note interval fragmentation of her right lower pole stone, which continues to appear nonobstructing.  Assessment & Plan:   1. Right renal stone Stone is fragmented and remains in the right lower pole.  Her UA is bland today, low concern for infection or retained fragment.  We discussed various options for her residual stone fragments including ureteroscopy versus repeat ESWL versus surveillance.  Given that she remains asymptomatic and her residual fragments are small, in shared decision making we elected to proceed with surveillance.  Will plan to see her back in 9 months with a repeat KUB and consider repeat procedure at that time based on interval symptoms or stone growth.  We discussed that her intermittent bilateral low back pain is likely musculoskeletal in origin.  I counseled the patient on general stone prevention techniques including increasing water intake with a goal of producing 2.5L of urine daily, decreasing intake of high oxalate-containing foods, increasing citric acid  intake, maintaining moderate dietary calcium intake, and decreasing salt intake.  Written and verbal guidance provided today.  - Urinalysis, Complete - DG Abd 1 View; Future   Return in about 9 months (around 08/06/2023) for Stone f/u with UA + KUB prior.  Carolyn Loop, Cordova  [facility] [address], [place], [place] [address] [phone]

## 2022-11-04 NOTE — Telephone Encounter (Signed)
Noted. Will look out for the paper work. I did ask her to find out about the [facility] paperwork and if she pursued it I would fill them out

## 2022-11-04 NOTE — Telephone Encounter (Signed)
Spoke with patient and advised her to get forms from her job to be brought back to office to be filled out by provider.

## 2022-11-04 NOTE — Telephone Encounter (Signed)
Patient called  for Carolyn Cordova to fill out paper work for her concerning FMLA. Call back number [phone]

## 2022-11-07 ENCOUNTER — Other Ambulatory Visit: Payer: Self-pay | Admitting: Nurse Practitioner

## 2022-11-07 DIAGNOSIS — F32A Depression, unspecified: Secondary | ICD-10-CM

## 2022-11-11 ENCOUNTER — Other Ambulatory Visit: Payer: Self-pay | Admitting: Nurse Practitioner

## 2022-11-11 DIAGNOSIS — F32A Depression, unspecified: Secondary | ICD-10-CM

## 2022-11-14 ENCOUNTER — Other Ambulatory Visit: Payer: Self-pay | Admitting: Physician Assistant

## 2022-11-14 DIAGNOSIS — R399 Unspecified symptoms and signs involving the genitourinary system: Secondary | ICD-10-CM

## 2022-11-16 ENCOUNTER — Other Ambulatory Visit: Payer: Self-pay | Admitting: Nurse Practitioner

## 2022-11-16 DIAGNOSIS — F419 Anxiety disorder, unspecified: Secondary | ICD-10-CM

## 2022-11-18 ENCOUNTER — Other Ambulatory Visit: Payer: Self-pay | Admitting: Nurse Practitioner

## 2022-11-18 DIAGNOSIS — F419 Anxiety disorder, unspecified: Secondary | ICD-10-CM

## 2022-11-18 MED ORDER — FLUOXETINE HCL 20 MG PO CAPS: 20.0000 mg | ORAL_CAPSULE | Freq: Every day | ORAL | 0 refills | Status: DC

## 2022-11-20 ENCOUNTER — Other Ambulatory Visit: Payer: Self-pay | Admitting: Family Medicine

## 2022-11-21 ENCOUNTER — Encounter: Payer: Self-pay | Admitting: Urology

## 2022-11-28 ENCOUNTER — Encounter: Payer: Self-pay | Admitting: Family Medicine

## 2022-11-28 ENCOUNTER — Ambulatory Visit (INDEPENDENT_AMBULATORY_CARE_PROVIDER_SITE_OTHER): Payer: BC Managed Care – PPO | Admitting: Family Medicine

## 2022-11-28 VITALS — BP 122/70 | HR 72 | Temp 97.9°F | Ht 67.0 in | Wt 137.2 lb

## 2022-11-28 DIAGNOSIS — F430 Acute stress reaction: Secondary | ICD-10-CM

## 2022-11-28 DIAGNOSIS — F32A Depression, unspecified: Secondary | ICD-10-CM

## 2022-11-28 DIAGNOSIS — F419 Anxiety disorder, unspecified: Secondary | ICD-10-CM | POA: Diagnosis not present

## 2022-11-28 MED ORDER — FLUOXETINE HCL 20 MG PO CAPS: 20.0000 mg | ORAL_CAPSULE | Freq: Every day | ORAL | 3 refills | Status: DC

## 2022-11-28 NOTE — Progress Notes (Unsigned)
   Subjective:    Patient ID: Carolyn Cordova, female    DOB: 31-Aug-1961, 62 y.o.   MRN: [id number]  HPI Pt presents for f/u of mood/ anx/dep and chronic medical problems   Wt Readings from Last 3 Encounters:  11/28/22 137 lb 4 oz (62.3 kg)  11/04/22 134 lb (60.8 kg)  10/31/22 138 lb (62.6 kg)   21.50 kg/m  Vitals:   11/28/22 0814  BP: 122/70  Pulse: 72  Temp: 97.9 F (36.6 C)  TempSrc: Temporal  SpO2: 98%  Weight: 137 lb 4 oz (62.3 kg)  Height: '5\' 7"'$  (1.702 m)     Pt saw NP Cable for mood on 12/15 They discussed grief and work stress PHQ scores was 18 and GAD 17  Started her on fluoexetine 10 to titrate to 20     Thinks the medicine is helping  No longer feels chest pressure  Mood is a little lighter  Is clearer to makes decisions   No side effects  Still wakes up frequently at night  Appetite is better   She also quit her job yesterday! It was hard on her/ unrealistic expectations for that   Needs some time to get better then search for another job  Needs time to grieve   Declines counseling right now   Her daughter got married this month   A lot of changes   Review of Systems     Objective:   Physical Exam        Assessment & Plan:

## 2022-11-28 NOTE — Patient Instructions (Signed)
Let us know if you want to do some counseling   Think about some exercise you enjoy   Continue your current medicine   Take care of yourself!

## 2022-11-30 NOTE — Assessment & Plan Note (Signed)
With stress reactoin  Reviewed note and plan from NP Cable on 12/15  Overall much improved with fluoxetine 20 mg daily (tolerates well) as well ad decreased stress from quitting her job  Reviewed stressors/ coping techniques/symptoms/ support sources/ tx options and side effects in detail today Will plan to continue medication for 6-12 months in light of recent life changes Declines counseling referral- urged her to let us know if she re considers  Enc good self care incl exercise and eventual smoking cessation

## 2023-01-31 ENCOUNTER — Other Ambulatory Visit: Payer: Self-pay | Admitting: Internal Medicine

## 2023-02-28 ENCOUNTER — Other Ambulatory Visit: Payer: Self-pay | Admitting: Family Medicine

## 2023-03-03 ENCOUNTER — Ambulatory Visit
Admission: RE | Admit: 2023-03-03 | Discharge: 2023-03-03 | Disposition: A | Discharge status: Home / Self Care | Payer: 59 | Source: Ambulatory Visit | Attending: Family Medicine | Admitting: Family Medicine

## 2023-03-03 DIAGNOSIS — Z1231 Encounter for screening mammogram for malignant neoplasm of breast: Secondary | ICD-10-CM | POA: Insufficient documentation

## 2023-03-05 ENCOUNTER — Other Ambulatory Visit: Payer: Self-pay | Admitting: Family Medicine

## 2023-03-05 ENCOUNTER — Inpatient Hospital Stay
Admission: RE | Admit: 2023-03-05 | Discharge: 2023-03-05 | Disposition: A | Discharge status: Home / Self Care | Payer: Self-pay | Source: Ambulatory Visit | Attending: Family Medicine | Admitting: Family Medicine

## 2023-03-05 ENCOUNTER — Other Ambulatory Visit: Payer: Self-pay | Admitting: *Deleted

## 2023-03-05 DIAGNOSIS — R928 Other abnormal and inconclusive findings on diagnostic imaging of breast: Secondary | ICD-10-CM

## 2023-03-05 DIAGNOSIS — Z1231 Encounter for screening mammogram for malignant neoplasm of breast: Secondary | ICD-10-CM

## 2023-03-05 DIAGNOSIS — N6489 Other specified disorders of breast: Secondary | ICD-10-CM

## 2023-03-07 ENCOUNTER — Other Ambulatory Visit: Payer: Self-pay | Admitting: Nurse Practitioner

## 2023-03-07 DIAGNOSIS — F419 Anxiety disorder, unspecified: Secondary | ICD-10-CM

## 2023-03-16 ENCOUNTER — Ambulatory Visit
Admission: RE | Admit: 2023-03-16 | Discharge: 2023-03-16 | Disposition: A | Discharge status: Home / Self Care | Payer: 59 | Source: Ambulatory Visit | Attending: Family Medicine | Admitting: Family Medicine

## 2023-03-16 DIAGNOSIS — R92331 Mammographic heterogeneous density, right breast: Secondary | ICD-10-CM | POA: Diagnosis not present

## 2023-03-16 DIAGNOSIS — N6489 Other specified disorders of breast: Secondary | ICD-10-CM | POA: Diagnosis not present

## 2023-03-16 DIAGNOSIS — R928 Other abnormal and inconclusive findings on diagnostic imaging of breast: Secondary | ICD-10-CM

## 2023-04-07 ENCOUNTER — Telehealth: Payer: Self-pay | Admitting: Family Medicine

## 2023-04-07 NOTE — Telephone Encounter (Signed)
I need to discuss with her at virtual visit, thanks for letting me know  Do avoid caffeine if she can

## 2023-04-07 NOTE — Telephone Encounter (Signed)
Pt notified of Dr. Tower's comments and verbalized understanding  

## 2023-04-07 NOTE — Telephone Encounter (Signed)
Patient contacted the office regarding some symptoms she is having while taking medication  FLUoxetine (PROZAC) 20 MG capsule, states she is having some shakiness I her hands and some rouble with remembering things. Made patient a virtual visit for tomorrow to discuss side effects with Dr. Milinda Antis, sending this message on behalf of patient to see if there is any suggestions for what she should do until then. Please advise, thank you.

## 2023-04-08 ENCOUNTER — Telehealth: Payer: 59 | Admitting: Family Medicine

## 2023-04-08 ENCOUNTER — Encounter: Payer: Self-pay | Admitting: Family Medicine

## 2023-04-08 DIAGNOSIS — F430 Acute stress reaction: Secondary | ICD-10-CM

## 2023-04-08 DIAGNOSIS — F17200 Nicotine dependence, unspecified, uncomplicated: Secondary | ICD-10-CM | POA: Diagnosis not present

## 2023-04-08 DIAGNOSIS — F32A Depression, unspecified: Secondary | ICD-10-CM

## 2023-04-08 DIAGNOSIS — F419 Anxiety disorder, unspecified: Secondary | ICD-10-CM | POA: Diagnosis not present

## 2023-04-08 NOTE — Patient Instructions (Addendum)
Take your fluoxetine every other day for a week  Then stop it  Let us know if mood worsens   Keep walking Add some strength training to your routine, this is important for bone and brain health and can reduce your risk of falls and help your body use insulin properly and regulate weight  Light weights, exercise bands , and internet videos are a good way to start  Yoga (chair or regular), machines , floor exercises or a gym with machines are also good options    Keep getting out side Continue to socialize as much as you can   If concentration and memory do not improve off the medication let us know

## 2023-04-08 NOTE — Assessment & Plan Note (Signed)
With stress reaction and grief  Has improved on fluoxetine 20 mg but now noting more cognitive/memory side effects Also shakiness Reviewed stressors/ coping techniques/symptoms/ support sources/ tx options and side effects in detail today Stressors are down Since symptoms are well controlled- would like ot come off of to see if still needed  Will dose fluoxetine 20 mg every other day for 1 wk and then stop  Plans to check back in with [place]  Encouraged good self care incl exercise /outdoor time also

## 2023-04-08 NOTE — Progress Notes (Signed)
Virtual Visit via Video Note  I connected with Carolyn Cordova on 04/08/23 at 12:00 PM EDT by a video enabled telemedicine application and verified that I am speaking with the correct person using two identifiers.  Location: Patient: home Provider: office   I discussed the limitations of evaluation and management by telemedicine and the availability of in person appointments. The patient expressed understanding and agreed to proceed.  Parties involved in encounter  Patient: Carolyn Cordova  Provider:  Roxy Manns MD   History of Present Illness: Pt presents for mood and side effects from medication   H/o anxiety and depression with grief reaction in the past (lost her husband)    Taking fluoxetine 20 mg cap  Notes some shakiness in hands Short term memory changes   Having trouble focusing ever since starting medicine  Hands are shaky   Does not feel anxious / doing well   A lot of stress came from work before she quit Leaving work really helped her   Self care is good  Trying to eat healthy food  Exercise - walking   Doe things with her daughters  Goes to mother's house  Getting out  Some friends across the street   Likes to get out and walk in the woods   Smoking status   Sertraline in past was sedating    Patient Active Problem List   Diagnosis Date Noted   Anxiety and depression 10/31/2022   Encounter for screening mammogram for breast cancer 09/25/2022   Grief 09/25/2022   Bronchitis 09/23/2022   Glucose found in urine on examination 09/06/2021   Stress reaction 07/30/2016   Internal hemorrhoid 04/10/2015   Routine general medical examination at a health care facility 03/09/2014   H/O herpes zoster 08/09/2012   Dry mouth 04/20/2012   Elevated rheumatoid factor 04/20/2012   Atrophic vaginitis 10/01/2011   HERNIATED CERVICAL DISC 09/14/2009   Pure hypercholesterolemia 05/10/2009   TOBACCO ABUSE 08/19/2007   CARPAL TUNNEL SYNDROME, BILATERAL 08/19/2007    Essential hypertension 08/19/2007   HEMORRHOIDS 08/19/2007   History of cervical dysplasia 08/19/2007   MIGRAINES, HX OF 08/19/2007   Past Medical History:  Diagnosis Date   Allergy    seasonal   Carpal tunnel syndrome    Cervical disc disease    GERD (gastroesophageal reflux disease)    Hyperlipidemia    Hypertension    Migraine    PMDD (premenstrual dysphoric disorder)    Past Surgical History:  Procedure Laterality Date   BREAST BIOPSY Left    unknown when, X-clip, benign   CESAREAN SECTION     COLONOSCOPY     CRYOABLATION  09/1989   secondary to dysplasia   EXTRACORPOREAL SHOCK WAVE LITHOTRIPSY Right 10/02/2022   Procedure: EXTRACORPOREAL SHOCK WAVE LITHOTRIPSY (ESWL);  Surgeon: Carolyn Altes, MD;  Location: [facility];  Service: Urology;  Laterality: Right;   EXTRACORPOREAL SHOCK WAVE LITHOTRIPSY Right 10/16/2022   Procedure: EXTRACORPOREAL SHOCK WAVE LITHOTRIPSY (ESWL);  Surgeon: Carolyn Scotland, MD;  Location: [facility];  Service: Urology;  Laterality: Right;   lipoma removal  2006   PILONIDAL CYST EXCISION  1980s   x2   plantar fasciitis  06/2011   surgery with Dr Al Corpus   POLYPECTOMY     SPINE SURGERY  01/2010   cervical spine surgery   Social History   Tobacco Use   Smoking status: Every Day    Packs/day: 1    Types: Cigarettes   Smokeless tobacco: Never  Vaping Use  Vaping Use: Never used  Substance Use Topics   Alcohol use: No    Alcohol/week: 0.0 standard drinks of alcohol   Drug use: No   Family History  Problem Relation Age of Onset   Hyperlipidemia Mother    Cancer Mother        breast CA   Breast cancer Mother    Cancer Father        adrenal cancer   Colon polyps Father    Hyperlipidemia Father    Breast cancer Maternal Aunt    Breast cancer Paternal Aunt    Breast cancer Maternal Grandmother    Cancer Cousin        breast CA   Hyperlipidemia Brother    Colon cancer Neg Hx    Esophageal cancer Neg Hx    Rectal cancer Neg Hx     Stomach cancer Neg Hx    Allergies  Allergen Reactions   Adhesive [Tape]    Amoxicillin-Pot Clavulanate     REACTION: rash   Codeine     REACTION: ? reaction   Fexofenadine     REACTION: nausea   Sertraline Hcl     REACTION: sedation   Varenicline Tartrate     REACTION: made her feel sick and did not work   Wellbutrin [Bupropion] Other (See Comments)   Current Outpatient Medications on File Prior to Visit  Medication Sig Dispense Refill   amLODipine (NORVASC) 5 MG tablet TAKE 1 TABLET BY MOUTH EVERY DAY 90 tablet 1   aspirin 81 MG tablet Take 81 mg by mouth daily.     cetirizine (ZYRTEC) 10 MG tablet Take 10 mg by mouth daily.     cevimeline (EVOXAC) 30 MG capsule TAKE 1 CAPSULE BY MOUTH 3 TIMES A DAY 270 capsule 3   FLUoxetine (PROZAC) 20 MG capsule Take 1 capsule (20 mg total) by mouth daily. After you have finished 15 days of the 10mg  capsule 90 capsule 3   rosuvastatin (CRESTOR) 5 MG tablet Take 1 tablet (5 mg total) by mouth daily. 90 tablet 3   tamsulosin (FLOMAX) 0.4 MG CAPS capsule TAKE 1 CAPSULE BY MOUTH EVERY DAY 90 capsule 3   No current facility-administered medications on file prior to visit.   Review of Systems  Constitutional:  Negative for chills, fever and malaise/fatigue.  HENT:  Negative for congestion, ear pain, sinus pain and sore throat.   Eyes:  Negative for blurred vision, discharge and redness.  Respiratory:  Negative for cough, shortness of breath and stridor.   Cardiovascular:  Negative for chest pain, palpitations and leg swelling.  Gastrointestinal:  Negative for abdominal pain, diarrhea, nausea and vomiting.  Musculoskeletal:  Negative for myalgias.  Skin:  Negative for rash.  Neurological:  Negative for dizziness and headaches.  Psychiatric/Behavioral:  Positive for memory loss. The patient is not nervous/anxious.        More short term memory loss and cognitive slowing     Observations/Objective: Patient appears well, in no distress Weight is  baseline  No facial swelling or asymmetry Normal voice-not hoarse and no slurred speech No obvious tremor or mobility impairment Moving neck and UEs normally Able to hear the call well  No cough or shortness of breath during interview  Talkative and mentally sharp with no cognitive changes  (no word finding today) No skin changes on face or neck , no rash or pallor Affect is normal    Assessment and Plan: Problem List Items Addressed This Visit  Other   Anxiety and depression - Primary    With stress reaction and grief  Has improved on fluoxetine 20 mg but now noting more cognitive/memory side effects Also shakiness Reviewed stressors/ coping techniques/symptoms/ support sources/ tx options and side effects in detail today Stressors are down Since symptoms are well controlled- would like ot come off of to see if still needed  Will dose fluoxetine 20 mg every other day for 1 wk and then stop  Plans to check back in with [place]  Encouraged good self care incl exercise /outdoor time also         Stress reaction    Some improvement  May be having side effects from fluoxetine Will wean off and report back  Stressors /grief are improved       TOBACCO ABUSE    Continues to smoke  Not ready to quit         Follow Up Instructions:   Take your fluoxetine every other day for a week  Then stop it  Let us know if mood worsens   Keep walking Add some strength training to your routine, this is important for bone and brain health and can reduce your risk of falls and help your body use insulin properly and regulate weight  Light weights, exercise bands , and internet videos are a good way to start  Yoga (chair or regular), machines , floor exercises or a gym with machines are also good options    Keep getting out side Continue to socialize as much as you can   If concentration and memory do not improve off the medication let us know  I discussed the assessment and  treatment plan with the patient. The patient was provided an opportunity to ask questions and all were answered. The patient agreed with the plan and demonstrated an understanding of the instructions.   The patient was advised to call back or seek an in-person evaluation if the symptoms worsen or if the condition fails to improve as anticipated   Roxy Manns, MD

## 2023-04-08 NOTE — Assessment & Plan Note (Signed)
Continues to smoke. Not ready to quit. 

## 2023-04-08 NOTE — Assessment & Plan Note (Signed)
Some improvement  May be having side effects from fluoxetine Will wean off and report back  Stressors /grief are improved

## 2023-06-02 ENCOUNTER — Other Ambulatory Visit: Payer: Self-pay | Admitting: Family Medicine

## 2023-06-24 DIAGNOSIS — Z809 Family history of malignant neoplasm, unspecified: Secondary | ICD-10-CM | POA: Diagnosis not present

## 2023-06-24 DIAGNOSIS — Z8249 Family history of ischemic heart disease and other diseases of the circulatory system: Secondary | ICD-10-CM | POA: Diagnosis not present

## 2023-06-24 DIAGNOSIS — I10 Essential (primary) hypertension: Secondary | ICD-10-CM | POA: Diagnosis not present

## 2023-06-24 DIAGNOSIS — Z720 Tobacco use: Secondary | ICD-10-CM | POA: Diagnosis not present

## 2023-06-24 DIAGNOSIS — M3500 Sicca syndrome, unspecified: Secondary | ICD-10-CM | POA: Diagnosis not present

## 2023-06-24 DIAGNOSIS — F325 Major depressive disorder, single episode, in full remission: Secondary | ICD-10-CM | POA: Diagnosis not present

## 2023-06-24 DIAGNOSIS — Z833 Family history of diabetes mellitus: Secondary | ICD-10-CM | POA: Diagnosis not present

## 2023-06-24 DIAGNOSIS — E785 Hyperlipidemia, unspecified: Secondary | ICD-10-CM | POA: Diagnosis not present

## 2023-06-25 ENCOUNTER — Other Ambulatory Visit: Payer: Self-pay | Admitting: Internal Medicine

## 2023-07-18 ENCOUNTER — Other Ambulatory Visit: Payer: Self-pay | Admitting: Internal Medicine

## 2023-08-06 ENCOUNTER — Ambulatory Visit
Admission: RE | Admit: 2023-08-06 | Discharge: 2023-08-06 | Disposition: A | Discharge status: Home / Self Care | Payer: 59 | Source: Ambulatory Visit | Attending: Physician Assistant | Admitting: Physician Assistant

## 2023-08-06 ENCOUNTER — Ambulatory Visit: Payer: 59 | Admitting: Physician Assistant

## 2023-08-06 ENCOUNTER — Ambulatory Visit
Admission: RE | Admit: 2023-08-06 | Discharge: 2023-08-06 | Disposition: A | Discharge status: Home / Self Care | Payer: 59 | Attending: Physician Assistant | Admitting: Physician Assistant

## 2023-08-06 VITALS — BP 131/79 | HR 67 | Ht 69.0 in | Wt 156.4 lb

## 2023-08-06 DIAGNOSIS — Z09 Encounter for follow-up examination after completed treatment for conditions other than malignant neoplasm: Secondary | ICD-10-CM

## 2023-08-06 DIAGNOSIS — Z87442 Personal history of urinary calculi: Secondary | ICD-10-CM

## 2023-08-06 DIAGNOSIS — N200 Calculus of kidney: Secondary | ICD-10-CM | POA: Insufficient documentation

## 2023-08-06 LAB — MICROSCOPIC EXAMINATION

## 2023-08-06 LAB — URINALYSIS, COMPLETE
Bilirubin, UA: NEGATIVE
Glucose, UA: NEGATIVE
Ketones, UA: NEGATIVE
Leukocytes,UA: NEGATIVE
Nitrite, UA: NEGATIVE
Protein,UA: NEGATIVE
RBC, UA: NEGATIVE
Specific Gravity, UA: 1.02 (ref 1.005–1.030)
Urobilinogen, Ur: 0.2 mg/dL (ref 0.2–1.0)
pH, UA: 7 (ref 5.0–7.5)

## 2023-10-26 ENCOUNTER — Other Ambulatory Visit: Payer: Self-pay | Admitting: Family Medicine

## 2023-10-26 NOTE — Telephone Encounter (Signed)
Will hold until appt tomorrow

## 2023-10-27 ENCOUNTER — Ambulatory Visit: Payer: 59 | Admitting: Family Medicine

## 2023-10-27 ENCOUNTER — Encounter: Payer: Self-pay | Admitting: Family Medicine

## 2023-10-27 VITALS — BP 124/68 | HR 69 | Temp 98.1°F | Ht 69.0 in | Wt 159.0 lb

## 2023-10-27 DIAGNOSIS — E7800 Pure hypercholesterolemia, unspecified: Secondary | ICD-10-CM | POA: Diagnosis not present

## 2023-10-27 DIAGNOSIS — Z23 Encounter for immunization: Secondary | ICD-10-CM | POA: Diagnosis not present

## 2023-10-27 DIAGNOSIS — R413 Other amnesia: Secondary | ICD-10-CM | POA: Diagnosis not present

## 2023-10-27 DIAGNOSIS — E538 Deficiency of other specified B group vitamins: Secondary | ICD-10-CM

## 2023-10-27 DIAGNOSIS — F419 Anxiety disorder, unspecified: Secondary | ICD-10-CM

## 2023-10-27 DIAGNOSIS — R682 Dry mouth, unspecified: Secondary | ICD-10-CM

## 2023-10-27 DIAGNOSIS — I10 Essential (primary) hypertension: Secondary | ICD-10-CM

## 2023-10-27 DIAGNOSIS — R7309 Other abnormal glucose: Secondary | ICD-10-CM | POA: Diagnosis not present

## 2023-10-27 DIAGNOSIS — F32A Depression, unspecified: Secondary | ICD-10-CM

## 2023-10-27 DIAGNOSIS — F17200 Nicotine dependence, unspecified, uncomplicated: Secondary | ICD-10-CM

## 2023-10-27 LAB — COMPREHENSIVE METABOLIC PANEL
ALT: 17 U/L (ref 0–35)
AST: 12 U/L (ref 0–37)
Albumin: 4.4 g/dL (ref 3.5–5.2)
Alkaline Phosphatase: 94 U/L (ref 39–117)
BUN: 16 mg/dL (ref 6–23)
CO2: 29 meq/L (ref 19–32)
Calcium: 9.1 mg/dL (ref 8.4–10.5)
Chloride: 104 meq/L (ref 96–112)
Creatinine, Ser: 0.77 mg/dL (ref 0.40–1.20)
GFR: 82.37 mL/min (ref >60.00)
Glucose, Bld: 78 mg/dL (ref 70–99)
Potassium: 4.1 meq/L (ref 3.5–5.1)
Sodium: 140 meq/L (ref 135–145)
Total Bilirubin: 0.6 mg/dL (ref 0.2–1.2)
Total Protein: 6.3 g/dL (ref 6.0–8.3)

## 2023-10-27 LAB — CBC WITH DIFFERENTIAL/PLATELET
Basophils Absolute: 0.1 10*3/uL (ref 0.0–0.1)
Basophils Relative: 1.2 % (ref 0.0–3.0)
Eosinophils Absolute: 0.3 10*3/uL (ref 0.0–0.7)
Eosinophils Relative: 4.4 % (ref 0.0–5.0)
HCT: 46.4 % — ABNORMAL HIGH (ref 36.0–46.0)
Hemoglobin: 15.2 g/dL — ABNORMAL HIGH (ref 12.0–15.0)
Lymphocytes Relative: 29.6 % (ref 12.0–46.0)
Lymphs Abs: 1.8 10*3/uL (ref 0.7–4.0)
MCHC: 32.8 g/dL (ref 30.0–36.0)
MCV: 95.5 fL (ref 78.0–100.0)
Monocytes Absolute: 0.5 10*3/uL (ref 0.1–1.0)
Monocytes Relative: 8.1 % (ref 3.0–12.0)
Neutro Abs: 3.5 10*3/uL (ref 1.4–7.7)
Neutrophils Relative %: 56.7 % (ref 43.0–77.0)
Platelets: 224 10*3/uL (ref 150.0–400.0)
RBC: 4.86 Mil/uL (ref 3.87–5.11)
RDW: 13.3 % (ref 11.5–15.5)
WBC: 6.1 10*3/uL (ref 4.0–10.5)

## 2023-10-27 LAB — LIPID PANEL
Cholesterol: 194 mg/dL (ref 0–200)
HDL: 57 mg/dL (ref >39.00)
LDL Cholesterol: 115 mg/dL — ABNORMAL HIGH (ref 0–99)
NonHDL: 137.23
Total CHOL/HDL Ratio: 3
Triglycerides: 109 mg/dL (ref 0.0–149.0)
VLDL: 21.8 mg/dL (ref 0.0–40.0)

## 2023-10-27 LAB — TSH: TSH: 1.31 u[IU]/mL (ref 0.35–5.50)

## 2023-10-27 LAB — SEDIMENTATION RATE: Sed Rate: 6 mm/h (ref 0–30)

## 2023-10-27 LAB — HEMOGLOBIN A1C: Hgb A1c MFr Bld: 5.8 % (ref 4.6–6.5)

## 2023-10-27 LAB — VITAMIN B12: Vitamin B-12: 185 pg/mL — ABNORMAL LOW (ref 211–911)

## 2023-10-27 MED ORDER — FLUOXETINE HCL 20 MG PO CAPS: 20.0000 mg | ORAL_CAPSULE | Freq: Every day | ORAL | 1 refills | Status: DC

## 2023-10-27 MED ORDER — AMLODIPINE BESYLATE 5 MG PO TABS: 5.0000 mg | ORAL_TABLET | Freq: Every day | ORAL | 1 refills | Status: DC

## 2023-10-27 MED ORDER — CEVIMELINE HCL 30 MG PO CAPS: ORAL_CAPSULE | ORAL | 1 refills | Status: DC

## 2023-10-27 NOTE — Assessment & Plan Note (Signed)
bp in fair control at this time  BP Readings from Last 1 Encounters:  10/27/23 124/68   No changes needed Most recent labs reviewed  Disc lifstyle change with low sodium diet and exercise  Continues amlodipine 5 mg daily  Encouraged smoking cessation  Lab today

## 2023-10-27 NOTE — Patient Instructions (Addendum)
Flu shot today  Tetanus shot today (Td)   Labs today   Start back on the generic prozac   Try and socialize as much as you can    Follow up in 2-4 weeks - review labs, discuss mood and memory some more also   Stay active  Add some strength training to your routine, this is important for bone and brain health and can reduce your risk of falls and help your body use insulin properly and regulate weight  Light weights, exercise bands , and internet videos are a good way to start  Yoga (chair or regular), machines , floor exercises or a gym with machines are also good options

## 2023-10-27 NOTE — Assessment & Plan Note (Signed)
Encouraged to start back on fluoxetine  20 mg  Sent to pharmacy  Having some cognitive /memory change also - ? If this is adding Follow up in 2-4 wk to discuss mood and memory in more detail

## 2023-10-27 NOTE — Assessment & Plan Note (Signed)
Lab today  Disc goals for lipids and reasons to control them Rev last labs with pt Rev low sat fat diet in detail Continues crestor 5 mg daily

## 2023-10-27 NOTE — Assessment & Plan Note (Signed)
Disc in detail risks of smoking and possible outcomes including copd, vascular/ heart disease, cancer , respiratory and sinus infections  Pt voices understanding  Not interested in quitting yet

## 2023-10-27 NOTE — Assessment & Plan Note (Signed)
A1c added to labs today  Encouraged a low glycemic diet

## 2023-10-27 NOTE — Progress Notes (Signed)
Subjective:    Patient ID: Carolyn Cordova, female    DOB: 1961-11-16, 62 y.o.   MRN: [id number]  HPI  Wt Readings from Last 3 Encounters:  10/27/23 159 lb (72.1 kg)  08/06/23 156 lb 6 oz (70.9 kg)  11/28/22 137 lb 4 oz (62.3 kg)   23.48 kg/m  Vitals:   10/27/23 1230  BP: 124/68  Pulse: 69  Temp: 98.1 F (36.7 C)  SpO2: 99%    Pt presents for follow up of chronic medical problems including HTN, dry mouth, mental health , hyperlipidemia in setting of tob abuse  Also new concern of short term memory /personality/cognitive change   Having some short term memory issues   Has not become lost  Not confused acutely  ? Brain fog / cognitive change - takes longer to do bills  Does like to read  Sees daughter a lot (lives with her)    HTN bp is stable today  No cp or palpitations or headaches or edema  No side effects to medicines  BP Readings from Last 3 Encounters:  10/27/23 124/68  08/06/23 131/79  11/28/22 122/70    Amlodipine 5 mg daily   Lab Results  Component Value Date   NA 143 09/18/2022   K 4.1 09/18/2022   CO2 27 09/18/2022   GLUCOSE 100 (H) 09/18/2022   BUN 17 09/18/2022   CREATININE 0.59 09/18/2022   CALCIUM 9.9 09/18/2022   GFR 96.98 09/18/2022   GFRNONAA >60 02/22/2022   Due for labs   Evoxac for dry mouth   Lab Results  Component Value Date   HGBA1C 5.8 09/18/2022      Mood     10/31/2022   11:05 AM 09/25/2022    9:17 AM 03/13/2021    8:58 AM 10/07/2019    3:52 PM 09/09/2018    8:46 AM  Depression screen PHQ 2/9  Decreased Interest 3 1 1  0 1  Down, Depressed, Hopeless 3 1 1  0 1  PHQ - 2 Score 6 2 2  0 2  Altered sleeping 3 1 1 2 2   Tired, decreased energy 3 1 1 2 2   Change in appetite 0 0 1 0 2  Feeling bad or failure about yourself  3 0 1 1 1   Trouble concentrating 3 1 1  0 1  Moving slowly or fidgety/restless 0 0 0 0 0  Suicidal thoughts 0 0 0 0 0  PHQ-9 Score 18 5 7 5 10   Difficult doing work/chores Very difficult        Grief plays a role - lost husband  Fluoxetine 20 mg daily - stopped it due to having trouble remembering  Has not noticed a difference  Daughter thinks she needs to get back on it   Fair amt of socialization  Takes care of a child intermittently as well   Due for flu shot and tetanus shot also     Hyperlipidemia Lab Results  Component Value Date   CHOL 160 09/18/2022   HDL 53.20 09/18/2022   LDLCALC 94 09/18/2022   LDLDIRECT 150.0 08/05/2017   TRIG 63.0 09/18/2022   CHOLHDL 3 09/18/2022   Due for labs  Crestor 5 mg daily   Continues to smoke Not ready to quit  May in the future No breathing problems    Due for Td and flu shots   Colonoscopy due for 5 y recall  Patient Active Problem List   Diagnosis Date Noted   Memory changes 10/27/2023  Anxiety and depression 10/31/2022   Encounter for screening mammogram for breast cancer 09/25/2022   Grief 09/25/2022   Bronchitis 09/23/2022   Elevated glucose level 09/06/2021   Stress reaction 07/30/2016   Internal hemorrhoid 04/10/2015   Routine general medical examination at a health care facility 03/09/2014   H/O herpes zoster 08/09/2012   Dry mouth 04/20/2012   Elevated rheumatoid factor 04/20/2012   Atrophic vaginitis 10/01/2011   HERNIATED CERVICAL DISC 09/14/2009   Pure hypercholesterolemia 05/10/2009   TOBACCO ABUSE 08/19/2007   CARPAL TUNNEL SYNDROME, BILATERAL 08/19/2007   Essential hypertension 08/19/2007   HEMORRHOIDS 08/19/2007   History of cervical dysplasia 08/19/2007   MIGRAINES, HX OF 08/19/2007   Past Medical History:  Diagnosis Date   Allergy    seasonal   Carpal tunnel syndrome    Cervical disc disease    GERD (gastroesophageal reflux disease)    Hyperlipidemia    Hypertension    Migraine    PMDD (premenstrual dysphoric disorder)    Past Surgical History:  Procedure Laterality Date   BREAST BIOPSY Left    unknown when, X-clip, benign   CESAREAN SECTION     COLONOSCOPY      CRYOABLATION  09/1989   secondary to dysplasia   EXTRACORPOREAL SHOCK WAVE LITHOTRIPSY Right 10/02/2022   Procedure: EXTRACORPOREAL SHOCK WAVE LITHOTRIPSY (ESWL);  Surgeon: Riki Altes, MD;  Location: [facility];  Service: Urology;  Laterality: Right;   EXTRACORPOREAL SHOCK WAVE LITHOTRIPSY Right 10/16/2022   Procedure: EXTRACORPOREAL SHOCK WAVE LITHOTRIPSY (ESWL);  Surgeon: Vanna Scotland, MD;  Location: [facility];  Service: Urology;  Laterality: Right;   lipoma removal  2006   PILONIDAL CYST EXCISION  1980s   x2   plantar fasciitis  06/2011   surgery with Dr Al Corpus   POLYPECTOMY     SPINE SURGERY  01/2010   cervical spine surgery   Social History   Tobacco Use   Smoking status: Every Day    Current packs/day: 1.00    Types: Cigarettes   Smokeless tobacco: Never  Vaping Use   Vaping status: Never Used  Substance Use Topics   Alcohol use: No    Alcohol/week: 0.0 standard drinks of alcohol   Drug use: No   Family History  Problem Relation Age of Onset   Hyperlipidemia Mother    Cancer Mother        breast CA   Breast cancer Mother    Cancer Father        adrenal cancer   Colon polyps Father    Hyperlipidemia Father    Breast cancer Maternal Aunt    Breast cancer Paternal Aunt    Breast cancer Maternal Grandmother    Cancer Cousin        breast CA   Hyperlipidemia Brother    Colon cancer Neg Hx    Esophageal cancer Neg Hx    Rectal cancer Neg Hx    Stomach cancer Neg Hx    Allergies  Allergen Reactions   Adhesive [Tape]    Amoxicillin-Pot Clavulanate     REACTION: rash   Codeine     REACTION: ? reaction   Fexofenadine     REACTION: nausea   Sertraline Hcl     REACTION: sedation   Varenicline Tartrate     REACTION: made her feel sick and did not work   Wellbutrin [Bupropion] Other (See Comments)   Current Outpatient Medications on File Prior to Visit  Medication Sig Dispense Refill   aspirin 81  MG tablet Take 81 mg by mouth daily.     cetirizine  (ZYRTEC) 10 MG tablet Take 10 mg by mouth daily.     rosuvastatin (CRESTOR) 5 MG tablet TAKE 1 TABLET (5 MG TOTAL) BY MOUTH DAILY. 90 tablet 1   tamsulosin (FLOMAX) 0.4 MG CAPS capsule TAKE 1 CAPSULE BY MOUTH EVERY DAY 90 capsule 3   No current facility-administered medications on file prior to visit.    Review of Systems  Constitutional:  Positive for fatigue. Negative for activity change, appetite change, fever and unexpected weight change.  HENT:  Negative for congestion, ear pain, rhinorrhea, sinus pressure and sore throat.   Eyes:  Negative for pain, redness and visual disturbance.  Respiratory:  Negative for cough, shortness of breath and wheezing.   Cardiovascular:  Negative for chest pain and palpitations.  Gastrointestinal:  Negative for abdominal pain, blood in stool, constipation and diarrhea.  Endocrine: Negative for polydipsia and polyuria.  Genitourinary:  Negative for dysuria, frequency and urgency.  Musculoskeletal:  Negative for arthralgias, back pain and myalgias.  Skin:  Negative for pallor and rash.  Allergic/Immunologic: Negative for environmental allergies.  Neurological:  Negative for dizziness, syncope, facial asymmetry, weakness, light-headedness and headaches.  Hematological:  Negative for adenopathy. Does not bruise/bleed easily.  Psychiatric/Behavioral:  Positive for decreased concentration and dysphoric mood. The patient is not nervous/anxious.        Some lack of motivation   Some changes in short term memory  ? Also with cognition         Objective:   Physical Exam Constitutional:      General: She is not in acute distress.    Appearance: Normal appearance. She is well-developed and normal weight. She is not ill-appearing or diaphoretic.  HENT:     Head: Normocephalic and atraumatic.     Mouth/Throat:     Comments: MM slightly dry  Eyes:     General: No scleral icterus.    Conjunctiva/sclera: Conjunctivae normal.     Pupils: Pupils are equal,  round, and reactive to light.  Neck:     Thyroid: No thyromegaly.     Vascular: No carotid bruit or JVD.  Cardiovascular:     Rate and Rhythm: Normal rate and regular rhythm.     Heart sounds: Normal heart sounds.     No gallop.  Pulmonary:     Effort: Pulmonary effort is normal. No respiratory distress.     Breath sounds: Normal breath sounds. No stridor. No wheezing, rhonchi or rales.     Comments: Diffusely distant bs  Abdominal:     General: There is no distension or abdominal bruit.     Palpations: Abdomen is soft.  Musculoskeletal:     Cervical back: Normal range of motion and neck supple.     Right lower leg: No edema.     Left lower leg: No edema.  Lymphadenopathy:     Cervical: No cervical adenopathy.  Skin:    General: Skin is warm and dry.     Coloration: Skin is not pale.     Findings: No rash.  Neurological:     Mental Status: She is alert.     Coordination: Coordination normal.     Deep Tendon Reflexes: Reflexes are normal and symmetric. Reflexes normal.  Psychiatric:        Attention and Perception: Attention normal.        Mood and Affect: Mood normal. Affect is not flat or tearful.  Speech: Speech normal.        Behavior: Behavior normal.     Comments: Candidly discusses symptoms and stressors   Supportive daughter here             Assessment & Plan:   Problem List Items Addressed This Visit       Cardiovascular and Mediastinum   Essential hypertension    bp in fair control at this time  BP Readings from Last 1 Encounters:  10/27/23 124/68   No changes needed Most recent labs reviewed  Disc lifstyle change with low sodium diet and exercise  Continues amlodipine 5 mg daily  Encouraged smoking cessation  Lab today      Relevant Medications   amLODipine (NORVASC) 5 MG tablet   Other Relevant Orders   TSH   Lipid Panel   Comprehensive metabolic panel   CBC with Differential/Platelet     Other   TOBACCO ABUSE    Disc in  detail risks of smoking and possible outcomes including copd, vascular/ heart disease, cancer , respiratory and sinus infections  Pt voices understanding  Not interested in quitting yet        Pure hypercholesterolemia    Lab today  Disc goals for lipids and reasons to control them Rev last labs with pt Rev low sat fat diet in detail Continues crestor 5 mg daily       Relevant Medications   amLODipine (NORVASC) 5 MG tablet   Other Relevant Orders   Lipid Panel   Memory changes - Primary    Some short term memory changes Personality changes Possible slowed cognition  Daughter is here with her today-helpful   Lab today  Will follow up for discussion of mood/memory with PHQ and also MMSE  Reassuring exam      Relevant Orders   Vitamin B12   Sedimentation Rate   Elevated glucose level    A1c added to labs today  Encouraged a low glycemic diet          Relevant Orders   Hemoglobin A1c   Dry mouth    Refilled evoxac 30 mg tid Is helpful  No clinical changes overall      Anxiety and depression    Encouraged to start back on fluoxetine  20 mg  Sent to pharmacy  Having some cognitive /memory change also - ? If this is adding Follow up in 2-4 wk to discuss mood and memory in more detail       Relevant Medications   FLUoxetine (PROZAC) 20 MG capsule

## 2023-10-27 NOTE — Assessment & Plan Note (Signed)
Some short term memory changes Personality changes Possible slowed cognition  Daughter is here with her today-helpful   Lab today  Will follow up for discussion of mood/memory with PHQ and also MMSE  Reassuring exam

## 2023-10-27 NOTE — Assessment & Plan Note (Signed)
Refilled evoxac 30 mg tid Is helpful  No clinical changes overall

## 2023-10-28 DIAGNOSIS — E538 Deficiency of other specified B group vitamins: Secondary | ICD-10-CM | POA: Insufficient documentation

## 2023-10-28 NOTE — Addendum Note (Signed)
Addended by: Roxy Manns A on: 10/28/2023 07:42 PM   Modules accepted: Orders

## 2023-10-28 NOTE — Assessment & Plan Note (Signed)
New today  Lab Results  Component Value Date   VITAMINB12 185 (L) 10/27/2023   Will treatment with B12 shot weekly for 4 weeks Also 1000 mcg b12 daily oral  Re check 2-3 mo

## 2023-11-04 ENCOUNTER — Ambulatory Visit (INDEPENDENT_AMBULATORY_CARE_PROVIDER_SITE_OTHER): Payer: 59

## 2023-11-04 DIAGNOSIS — E538 Deficiency of other specified B group vitamins: Secondary | ICD-10-CM | POA: Diagnosis not present

## 2023-11-04 MED ORDER — CYANOCOBALAMIN 1000 MCG/ML IJ SOLN
1000.0000 ug | Freq: Once | INTRAMUSCULAR | Status: AC
  Administered 2023-11-04: 1000 ug via INTRAMUSCULAR

## 2023-11-04 NOTE — Progress Notes (Signed)
Per orders of Dr. Roxy Manns, injection of vitamin b 12 given by Lewanda Rife in right deltoid. Patient tolerated injection well. Patient will make appointment for 1 week. This was pts first vitamin b 12 injection and pt waited 15 mins with no problems, no difficulty breathing and no swelling.

## 2023-11-12 ENCOUNTER — Ambulatory Visit (INDEPENDENT_AMBULATORY_CARE_PROVIDER_SITE_OTHER): Payer: 59

## 2023-11-12 ENCOUNTER — Ambulatory Visit: Payer: 59 | Admitting: Family Medicine

## 2023-11-12 DIAGNOSIS — E538 Deficiency of other specified B group vitamins: Secondary | ICD-10-CM | POA: Diagnosis not present

## 2023-11-12 MED ORDER — CYANOCOBALAMIN 1000 MCG/ML IJ SOLN
1000.0000 ug | Freq: Once | INTRAMUSCULAR | Status: AC
  Administered 2023-11-12: 1000 ug via INTRAMUSCULAR

## 2023-11-12 NOTE — Progress Notes (Signed)
Per orders of Dr. Marne Tower, injection of vitamin b 12  given by Rena Isley in left deltoid. Patient tolerated injection well. Patient will make appointment for 1 week.   

## 2023-11-19 ENCOUNTER — Ambulatory Visit: Payer: 59 | Admitting: Family Medicine

## 2023-11-19 ENCOUNTER — Ambulatory Visit: Payer: 59

## 2023-11-19 ENCOUNTER — Encounter: Payer: Self-pay | Admitting: Family Medicine

## 2023-11-19 ENCOUNTER — Ambulatory Visit (INDEPENDENT_AMBULATORY_CARE_PROVIDER_SITE_OTHER): Payer: 59 | Admitting: Family Medicine

## 2023-11-19 VITALS — BP 116/70 | HR 76 | Temp 98.5°F | Ht 69.0 in | Wt 157.5 lb

## 2023-11-19 DIAGNOSIS — F419 Anxiety disorder, unspecified: Secondary | ICD-10-CM | POA: Diagnosis not present

## 2023-11-19 DIAGNOSIS — E7800 Pure hypercholesterolemia, unspecified: Secondary | ICD-10-CM

## 2023-11-19 DIAGNOSIS — R413 Other amnesia: Secondary | ICD-10-CM | POA: Diagnosis not present

## 2023-11-19 DIAGNOSIS — E538 Deficiency of other specified B group vitamins: Secondary | ICD-10-CM | POA: Diagnosis not present

## 2023-11-19 DIAGNOSIS — I10 Essential (primary) hypertension: Secondary | ICD-10-CM

## 2023-11-19 DIAGNOSIS — F32A Depression, unspecified: Secondary | ICD-10-CM

## 2023-11-19 DIAGNOSIS — F17200 Nicotine dependence, unspecified, uncomplicated: Secondary | ICD-10-CM | POA: Diagnosis not present

## 2023-11-19 MED ORDER — CYANOCOBALAMIN 1000 MCG/ML IJ SOLN
1000.0000 ug | Freq: Once | INTRAMUSCULAR | Status: AC
  Administered 2023-11-19: 1000 ug via INTRAMUSCULAR

## 2023-11-19 NOTE — Assessment & Plan Note (Signed)
 bp in fair control at this time  BP Readings from Last 1 Encounters:  11/19/23 116/70   No changes needed Most recent labs reviewed  Disc lifstyle change with low sodium diet and exercise  Continues amlodipine  5 mg daily  Encouraged smoking cessation

## 2023-11-19 NOTE — Assessment & Plan Note (Signed)
 Just started back on fluoxetine  20 mg daily  Mood seems improved and may need more time to see full effect  PHQ 1 and GAD7 4  Overall more motivated and has been out walking and doing self care activities   Will take more time if this improves cognition

## 2023-11-19 NOTE — Assessment & Plan Note (Signed)
 Disc goals for lipids and reasons to control them Rev last labs with pt Rev low sat fat diet in detail LDL up to 115  Crestor 5 mg daily  May consider going up on this in February  Encouraged strongly to watch diet / has been holiday eating

## 2023-11-19 NOTE — Assessment & Plan Note (Signed)
 Lab Results  Component Value Date   VITAMINB12 185 (L) 10/27/2023   Today is 3 of 4 B12 shots weekly Taking 1000 mcg B12 Thinks this is starting to help brain fog and cognition  Will follow up in feb for re check

## 2023-11-19 NOTE — Progress Notes (Signed)
 Subjective:    Patient ID: Carolyn Cordova, female    DOB: 1961-07-15, 63 y.o.   MRN: [id number]  HPI  Wt Readings from Last 3 Encounters:  11/19/23 157 lb 8 oz (71.4 kg)  10/27/23 159 lb (72.1 kg)  08/06/23 156 lb 6 oz (70.9 kg)   23.26 kg/m  Vitals:   11/19/23 1028  BP: 116/70  Pulse: 76  Temp: 98.5 F (36.9 C)  SpO2: 98%   Pt presents for follow up of memory issues and mood  Also B12 def   Lab Results  Component Value Date   VITAMINB12 185 (L) 10/27/2023   Plan made for 4 b12 shots then also 1000 mcg B12 orally  Has more energy   Walks every day now  Enjoys it -now finally has some time    Mood Last visit started back on fluoxetine  20 mg daily  Thinks she is doing a little better      11/19/2023   10:39 AM 10/31/2022   11:05 AM 09/25/2022    9:17 AM 03/13/2021    8:58 AM 10/07/2019    3:52 PM  Depression screen PHQ 2/9  Decreased Interest 0 3 1 1  0  Down, Depressed, Hopeless 0 3 1 1  0  PHQ - 2 Score 0 6 2 2  0  Altered sleeping 0 3 1 1 2   Tired, decreased energy 1 3 1 1 2   Change in appetite 0 0 0 1 0  Feeling bad or failure about yourself  0 3 0 1 1  Trouble concentrating 0 3 1 1  0  Moving slowly or fidgety/restless 0 0 0 0 0  Suicidal thoughts 0 0 0 0 0  PHQ-9 Score 1 18 5 7 5   Difficult doing work/chores Not difficult at all Very difficult         11/19/2023   10:39 AM 10/31/2022   11:07 AM  GAD 7 : Generalized Anxiety Score  Nervous, Anxious, on Edge 1 3  Control/stop worrying 1 3  Worry too much - different things 1 3  Trouble relaxing 1 3  Restless 0 3  Easily annoyed or irritable 0 0  Afraid - awful might happen 0 0  Total GAD 7 Score 4 15  Anxiety Difficulty Not difficult at all Very difficult    Hyperlipidemia Lab Results  Component Value Date   CHOL 194 10/27/2023   CHOL 160 09/18/2022   CHOL 251 (H) 03/13/2021   Lab Results  Component Value Date   HDL 57.00 10/27/2023   HDL 53.20 09/18/2022   HDL 51.90 03/13/2021    Lab Results  Component Value Date   LDLCALC 115 (H) 10/27/2023   LDLCALC 94 09/18/2022   LDLCALC 176 (H) 03/13/2021   Lab Results  Component Value Date   TRIG 109.0 10/27/2023   TRIG 63.0 09/18/2022   TRIG 113.0 03/13/2021   Lab Results  Component Value Date   CHOLHDL 3 10/27/2023   CHOLHDL 3 09/18/2022   CHOLHDL 5 03/13/2021   Lab Results  Component Value Date   LDLDIRECT 150.0 08/05/2017   LDLDIRECT 217.5 12/11/2010   LDLDIRECT 185.0 05/10/2009   Continues crestor  5 mg daily   Labs from last visit Office Visit on 10/27/2023  Component Date Value Ref Range Status   TSH 10/27/2023 1.31  0.35 - 5.50 uIU/mL Final   Cholesterol 10/27/2023 194  0 - 200 mg/dL Final   ATP III Classification       Desirable:  < 200  mg/dL               Borderline High:  200 - 239 mg/dL          High:  > = 759 mg/dL   Triglycerides 87/89/7975 109.0  0.0 - 149.0 mg/dL Final   Normal:  <849 mg/dLBorderline High:  150 - 199 mg/dL   HDL 87/89/7975 42.99  >39.00 mg/dL Final   VLDL 87/89/7975 21.8  0.0 - 40.0 mg/dL Final   LDL Cholesterol 10/27/2023 115 (H)  0 - 99 mg/dL Final   Total CHOL/HDL Ratio 10/27/2023 3   Final                  Men          Women1/2 Average Risk     3.4          3.3Average Risk          5.0          4.42X Average Risk          9.6          7.13X Average Risk          15.0          11.0                       NonHDL 10/27/2023 137.23   Final   NOTE:  Non-HDL goal should be 30 mg/dL higher than patient's LDL goal (i.e. LDL goal of < 70 mg/dL, would have non-HDL goal of < 100 mg/dL)   Sodium 87/89/7975 859  135 - 145 mEq/L Final   Potassium 10/27/2023 4.1  3.5 - 5.1 mEq/L Final   Chloride 10/27/2023 104  96 - 112 mEq/L Final   CO2 10/27/2023 29  19 - 32 mEq/L Final   Glucose, Bld 10/27/2023 78  70 - 99 mg/dL Final   BUN 87/89/7975 16  6 - 23 mg/dL Final   Creatinine, Ser 10/27/2023 0.77  0.40 - 1.20 mg/dL Final   Total Bilirubin 10/27/2023 0.6  0.2 - 1.2 mg/dL Final    Alkaline Phosphatase 10/27/2023 94  39 - 117 U/L Final   AST 10/27/2023 12  0 - 37 U/L Final   ALT 10/27/2023 17  0 - 35 U/L Final   Total Protein 10/27/2023 6.3  6.0 - 8.3 g/dL Final   Albumin 87/89/7975 4.4  3.5 - 5.2 g/dL Final   GFR 87/89/7975 82.37  >60.00 mL/min Final   Calculated using the CKD-EPI Creatinine Equation (2021)   Calcium  10/27/2023 9.1  8.4 - 10.5 mg/dL Final   WBC 87/89/7975 6.1  4.0 - 10.5 K/uL Final   RBC 10/27/2023 4.86  3.87 - 5.11 Mil/uL Final   Hemoglobin 10/27/2023 15.2 (H)  12.0 - 15.0 g/dL Final   HCT 87/89/7975 46.4 (H)  36.0 - 46.0 % Final   MCV 10/27/2023 95.5  78.0 - 100.0 fl Final   MCHC 10/27/2023 32.8  30.0 - 36.0 g/dL Final   RDW 87/89/7975 13.3  11.5 - 15.5 % Final   Platelets 10/27/2023 224.0  150.0 - 400.0 K/uL Final   Neutrophils Relative % 10/27/2023 56.7  43.0 - 77.0 % Final   Lymphocytes Relative 10/27/2023 29.6  12.0 - 46.0 % Final   Monocytes Relative 10/27/2023 8.1  3.0 - 12.0 % Final   Eosinophils Relative 10/27/2023 4.4  0.0 - 5.0 % Final   Basophils Relative 10/27/2023 1.2  0.0 - 3.0 % Final  Neutro Abs 10/27/2023 3.5  1.4 - 7.7 K/uL Final   Lymphs Abs 10/27/2023 1.8  0.7 - 4.0 K/uL Final   Monocytes Absolute 10/27/2023 0.5  0.1 - 1.0 K/uL Final   Eosinophils Absolute 10/27/2023 0.3  0.0 - 0.7 K/uL Final   Basophils Absolute 10/27/2023 0.1  0.0 - 0.1 K/uL Final   Hgb A1c MFr Bld 10/27/2023 5.8  4.6 - 6.5 % Final   Glycemic Control Guidelines for People with Diabetes:Non Diabetic:  <6%Goal of Therapy: <7%Additional Action Suggested:  >8%    Vitamin B-12 10/27/2023 185 (L)  211 - 911 pg/mL Final   Sed Rate 10/27/2023 6  0 - 30 mm/hr Final     Patient Active Problem List   Diagnosis Date Noted   B12 deficiency 10/28/2023   Memory changes 10/27/2023   Anxiety and depression 10/31/2022   Encounter for screening mammogram for breast cancer 09/25/2022   Grief 09/25/2022   Bronchitis 09/23/2022   Elevated glucose level 09/06/2021    Stress reaction 07/30/2016   Internal hemorrhoid 04/10/2015   Routine general medical examination at a health care facility 03/09/2014   H/O herpes zoster 08/09/2012   Dry mouth 04/20/2012   Elevated rheumatoid factor 04/20/2012   Atrophic vaginitis 10/01/2011   HERNIATED CERVICAL DISC 09/14/2009   Pure hypercholesterolemia 05/10/2009   TOBACCO ABUSE 08/19/2007   CARPAL TUNNEL SYNDROME, BILATERAL 08/19/2007   Essential hypertension 08/19/2007   HEMORRHOIDS 08/19/2007   History of cervical dysplasia 08/19/2007   MIGRAINES, HX OF 08/19/2007   Past Medical History:  Diagnosis Date   Allergy    seasonal   Carpal tunnel syndrome    Cervical disc disease    GERD (gastroesophageal reflux disease)    Hyperlipidemia    Hypertension    Migraine    PMDD (premenstrual dysphoric disorder)    Past Surgical History:  Procedure Laterality Date   BREAST BIOPSY Left    unknown when, X-clip, benign   CESAREAN SECTION     COLONOSCOPY     CRYOABLATION  09/1989   secondary to dysplasia   EXTRACORPOREAL SHOCK WAVE LITHOTRIPSY Right 10/02/2022   Procedure: EXTRACORPOREAL SHOCK WAVE LITHOTRIPSY (ESWL);  Surgeon: Twylla Glendia BROCKS, MD;  Location: [facility];  Service: Urology;  Laterality: Right;   EXTRACORPOREAL SHOCK WAVE LITHOTRIPSY Right 10/16/2022   Procedure: EXTRACORPOREAL SHOCK WAVE LITHOTRIPSY (ESWL);  Surgeon: Penne Knee, MD;  Location: [facility];  Service: Urology;  Laterality: Right;   lipoma removal  2006   PILONIDAL CYST EXCISION  1980s   x2   plantar fasciitis  06/2011   surgery with Dr Verta   POLYPECTOMY     SPINE SURGERY  01/2010   cervical spine surgery   Social History   Tobacco Use   Smoking status: Every Day    Current packs/day: 1.00    Types: Cigarettes   Smokeless tobacco: Never  Vaping Use   Vaping status: Never Used  Substance Use Topics   Alcohol use: No    Alcohol/week: 0.0 standard drinks of alcohol   Drug use: No   Family History  Problem Relation  Age of Onset   Hyperlipidemia Mother    Cancer Mother        breast CA   Breast cancer Mother    Cancer Father        adrenal cancer   Colon polyps Father    Hyperlipidemia Father    Breast cancer Maternal Aunt    Breast cancer Paternal Aunt    Breast  cancer Maternal Grandmother    Cancer Cousin        breast CA   Hyperlipidemia Brother    Colon cancer Neg Hx    Esophageal cancer Neg Hx    Rectal cancer Neg Hx    Stomach cancer Neg Hx    Allergies  Allergen Reactions   Adhesive [Tape]    Amoxicillin-Pot Clavulanate     REACTION: rash   Codeine     REACTION: ? reaction   Fexofenadine     REACTION: nausea   Sertraline Hcl     REACTION: sedation   Varenicline Tartrate     REACTION: made her feel sick and did not work   Wellbutrin [Bupropion] Other (See Comments)   Current Outpatient Medications on File Prior to Visit  Medication Sig Dispense Refill   amLODipine  (NORVASC ) 5 MG tablet Take 1 tablet (5 mg total) by mouth daily. 90 tablet 1   aspirin 81 MG tablet Take 81 mg by mouth daily.     cetirizine (ZYRTEC) 10 MG tablet Take 10 mg by mouth daily.     cevimeline  (EVOXAC ) 30 MG capsule TAKE 1 CAPSULE BY MOUTH 3 TIMES A DAY 270 capsule 1   cyanocobalamin  (VITAMIN B12) 1000 MCG tablet Take 1,000 mcg by mouth daily.     FLUoxetine  (PROZAC ) 20 MG capsule Take 1 capsule (20 mg total) by mouth daily. 90 capsule 1   rosuvastatin  (CRESTOR ) 5 MG tablet TAKE 1 TABLET (5 MG TOTAL) BY MOUTH DAILY. 90 tablet 1   tamsulosin  (FLOMAX ) 0.4 MG CAPS capsule TAKE 1 CAPSULE BY MOUTH EVERY DAY 90 capsule 3   No current facility-administered medications on file prior to visit.    Review of Systems  Constitutional:  Positive for activity change. Negative for appetite change, fatigue, fever and unexpected weight change.  HENT:  Negative for congestion, ear pain, rhinorrhea, sinus pressure and sore throat.   Eyes:  Negative for pain, redness and visual disturbance.  Respiratory:  Negative for  cough, shortness of breath and wheezing.   Cardiovascular:  Negative for chest pain and palpitations.  Gastrointestinal:  Negative for abdominal pain, blood in stool, constipation and diarrhea.  Endocrine: Negative for polydipsia and polyuria.  Genitourinary:  Negative for dysuria, frequency and urgency.  Musculoskeletal:  Negative for arthralgias, back pain and myalgias.  Skin:  Negative for pallor and rash.  Allergic/Immunologic: Negative for environmental allergies.  Neurological:  Negative for dizziness, syncope and headaches.  Hematological:  Negative for adenopathy. Does not bruise/bleed easily.  Psychiatric/Behavioral:  Positive for decreased concentration. Negative for agitation, behavioral problems, confusion, dysphoric mood, sleep disturbance and suicidal ideas. The patient is nervous/anxious.        Objective:   Physical Exam Constitutional:      General: She is not in acute distress.    Appearance: Normal appearance. She is well-developed and normal weight.  HENT:     Head: Normocephalic and atraumatic.  Eyes:     Conjunctiva/sclera: Conjunctivae normal.     Pupils: Pupils are equal, round, and reactive to light.  Neck:     Thyroid : No thyromegaly.     Vascular: No carotid bruit or JVD.  Cardiovascular:     Rate and Rhythm: Normal rate and regular rhythm.     Heart sounds:     No gallop.  Pulmonary:     Effort: Pulmonary effort is normal. No respiratory distress.  Abdominal:     General: There is no distension or abdominal bruit.  Palpations: Abdomen is soft.  Musculoskeletal:     Cervical back: Normal range of motion and neck supple.     Right lower leg: No edema.     Left lower leg: No edema.  Lymphadenopathy:     Cervical: No cervical adenopathy.  Skin:    General: Skin is warm and dry.     Coloration: Skin is not pale.     Findings: No rash.  Neurological:     Mental Status: She is alert.     Coordination: Coordination normal.     Deep Tendon  Reflexes: Reflexes are normal and symmetric. Reflexes normal.  Psychiatric:        Mood and Affect: Mood normal.     Comments: Mood is good  More relaxed Candidly discusses symptoms and stressors    MMSE today 28.5 out of 30  Only missed point was 3 word recall Normal clock draw            Assessment & Plan:   Problem List Items Addressed This Visit       Cardiovascular and Mediastinum   Essential hypertension   bp in fair control at this time  BP Readings from Last 1 Encounters:  11/19/23 116/70   No changes needed Most recent labs reviewed  Disc lifstyle change with low sodium diet and exercise  Continues amlodipine  5 mg daily  Encouraged smoking cessation          Other   TOBACCO ABUSE   Disc in detail risks of smoking and possible outcomes including copd, vascular/ heart disease, cancer , respiratory and sinus infections  Pt voices understanding Again strongly urged to quit  She is not ready      Pure hypercholesterolemia   Disc goals for lipids and reasons to control them Rev last labs with pt Rev low sat fat diet in detail LDL up to 115  Crestor  5 mg daily  May consider going up on this in February  Encouraged strongly to watch diet / has been holiday eating       Memory changes - Primary   Some improvement so far back on antidepressant and also with supplementation of B12   MMSE score was 28.5 out of 30-overall reassuring  Also good clock draw  Will follow closely  Follow up feb- if she feels she is still sturggling at that time can discuss possible of MCI (primarily short term memory)   Has increased exercise and outdoor time Encouraged strongly to socialize 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

## 2023-11-19 NOTE — Assessment & Plan Note (Signed)
 Disc in detail risks of smoking and possible outcomes including copd, vascular/ heart disease, cancer , respiratory and sinus infections  Pt voices understanding Again strongly urged to quit  She is not ready

## 2023-11-19 NOTE — Assessment & Plan Note (Signed)
 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

## 2023-11-19 NOTE — Patient Instructions (Addendum)
 B12 shot today (your 3rd of 4 )  Then get you next one on the 9th as planned  Continue the oral B12   You performed well on the questionnaire today  We are going to closely monitor memory and cognition over time  I want to see if the B12 and fluoxetine  continue to help   Follow up with me in February as planned for your annual    Take care of yourself   Please consider quitting smoking   Stay active physically and mentally

## 2023-11-26 ENCOUNTER — Ambulatory Visit (INDEPENDENT_AMBULATORY_CARE_PROVIDER_SITE_OTHER): Payer: 59

## 2023-11-26 ENCOUNTER — Other Ambulatory Visit: Payer: Self-pay | Admitting: Physician Assistant

## 2023-11-26 ENCOUNTER — Ambulatory Visit: Payer: 59 | Admitting: Family Medicine

## 2023-11-26 DIAGNOSIS — E538 Deficiency of other specified B group vitamins: Secondary | ICD-10-CM

## 2023-11-26 DIAGNOSIS — R399 Unspecified symptoms and signs involving the genitourinary system: Secondary | ICD-10-CM

## 2023-11-26 MED ORDER — CYANOCOBALAMIN 1000 MCG/ML IJ SOLN
1000.0000 ug | Freq: Once | INTRAMUSCULAR | Status: AC
  Administered 2023-11-26: 1000 ug via INTRAMUSCULAR

## 2023-11-26 NOTE — Progress Notes (Signed)
 Per orders of Dr. Roxy Manns, injection of vitamin b 12 given by Lewanda Rife in left deltoid. Patient tolerated injection well. This is # 4 wkly injection and pt is taking OTC vitamin b 12 po 1000 mcg daily.. pt will reck vitamin b 12 labs in 1 - 2 months

## 2023-12-01 IMAGING — CT CT RENAL STONE PROTOCOL
2 of 4 series · 16 of 46 positions shown, 18 images · non-contrast
Comparison: CT abdomen dated 03/25/2007.

CLINICAL DATA: Flank pain.



[Series 2: ap without · axial · non-contrast · 0.67mm/px · z∈[-1844,-1434]mm · 13 of 94 slices shown, 15 images]
[im 6/94  soft-tissue]
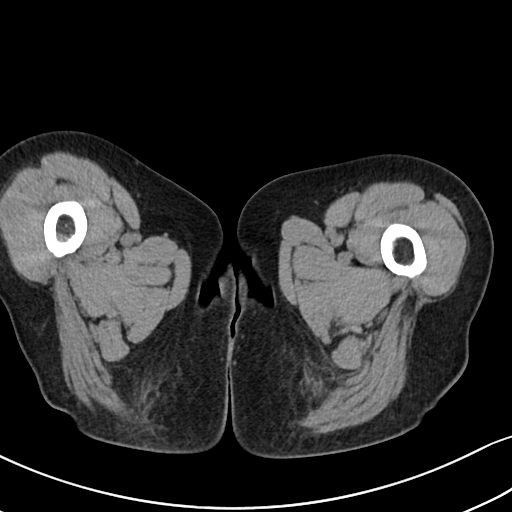
[im 6/94  bone]
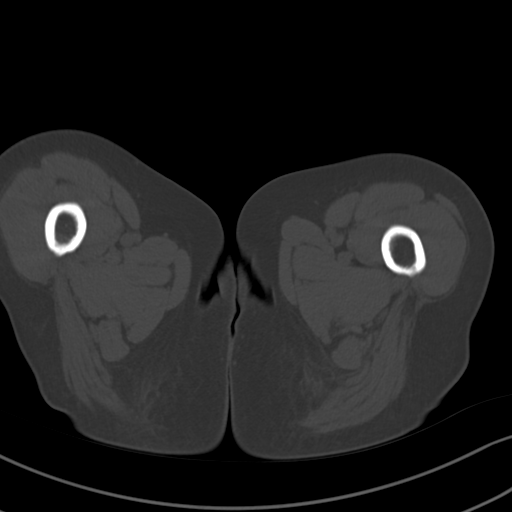
[im 11/94  soft-tissue]
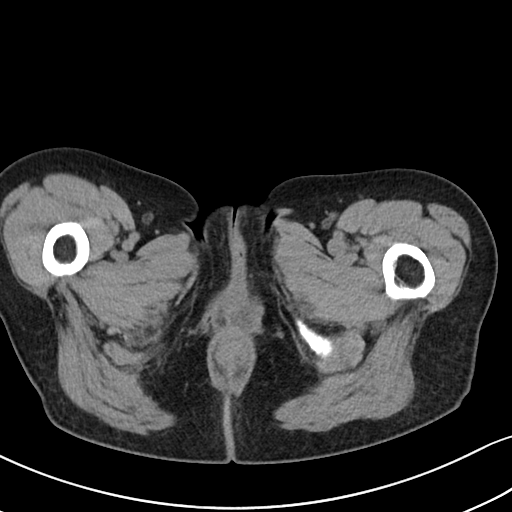
[im 21/94  soft-tissue]
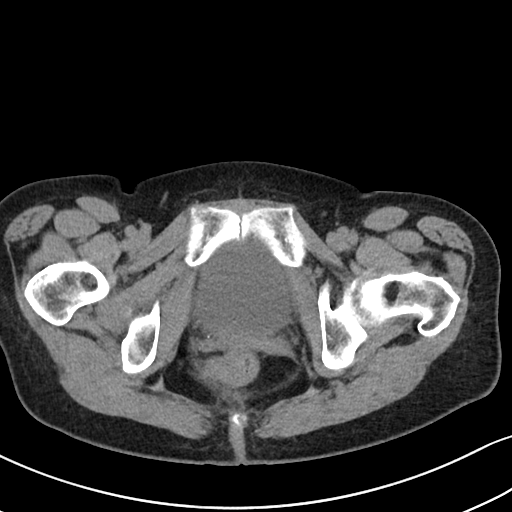
[im 26/94  soft-tissue]
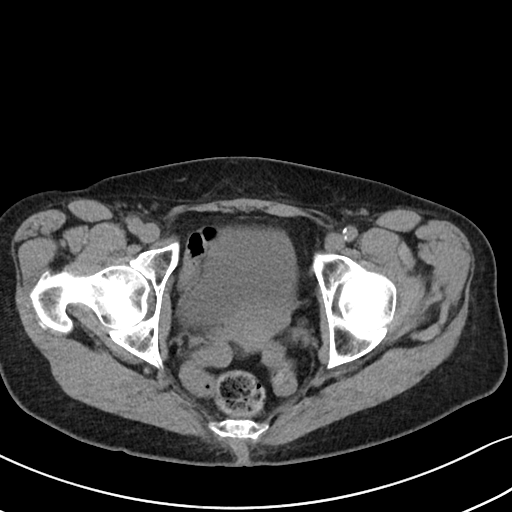
[im 32/94  soft-tissue]
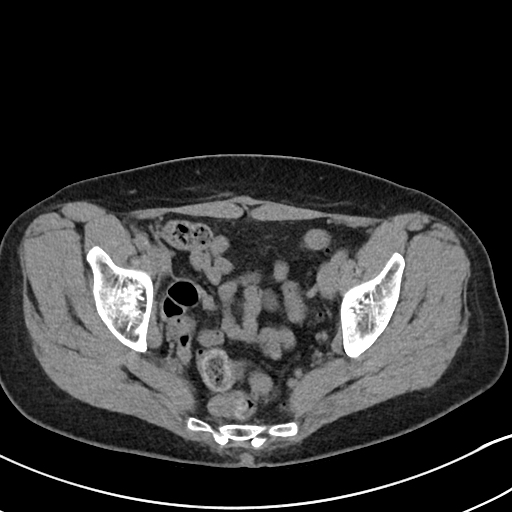
[im 42/94  soft-tissue]
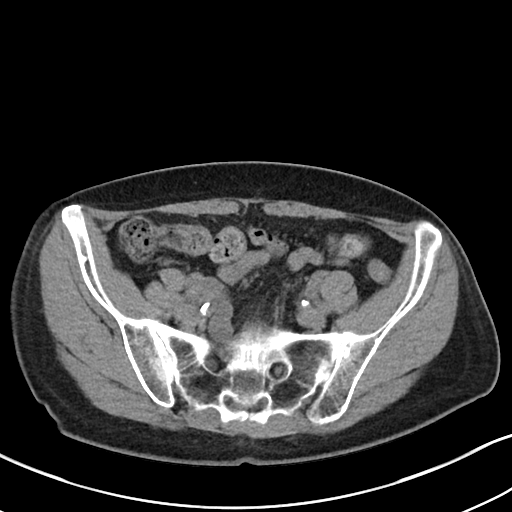
[im 47/94  soft-tissue]
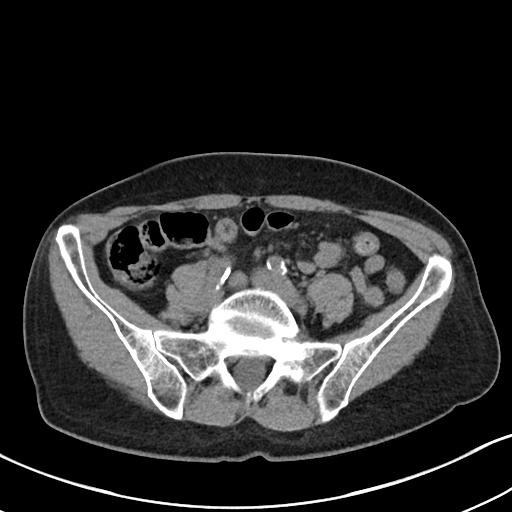
[im 52/94  soft-tissue]
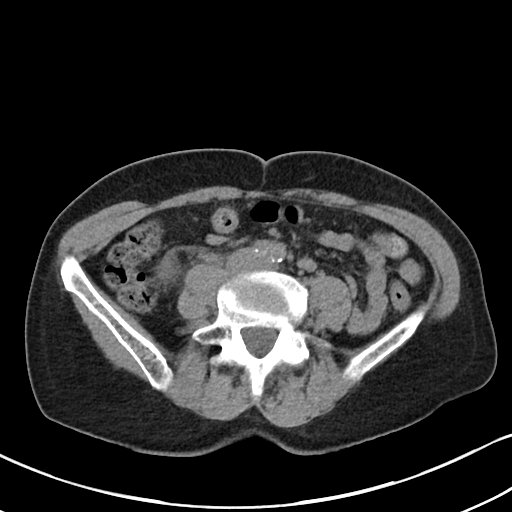
[im 63/94  soft-tissue]
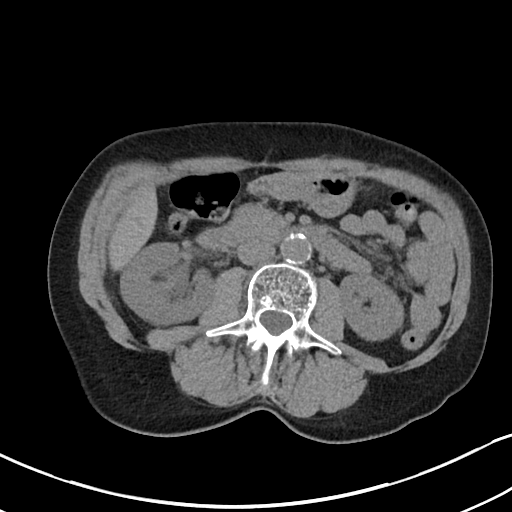
[im 63/94  bone]
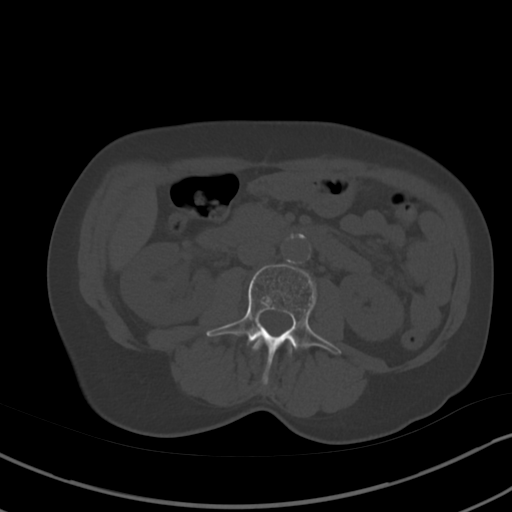
[im 68/94  soft-tissue]
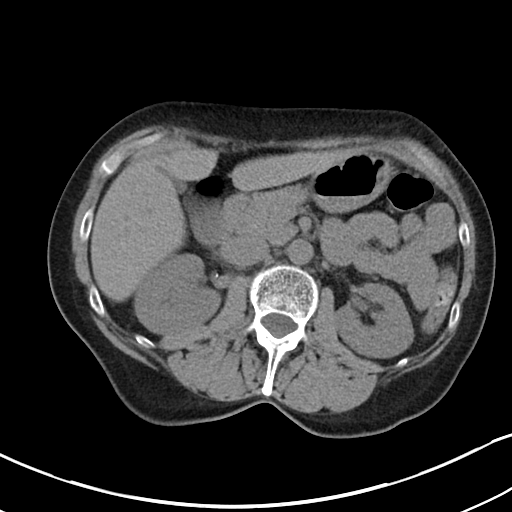
[im 73/94  soft-tissue]
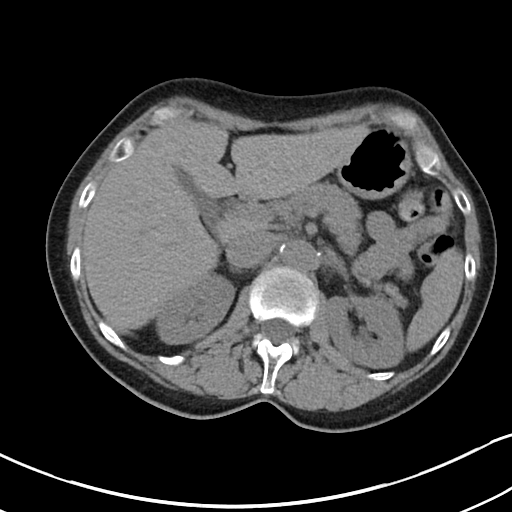
[im 83/94  soft-tissue]
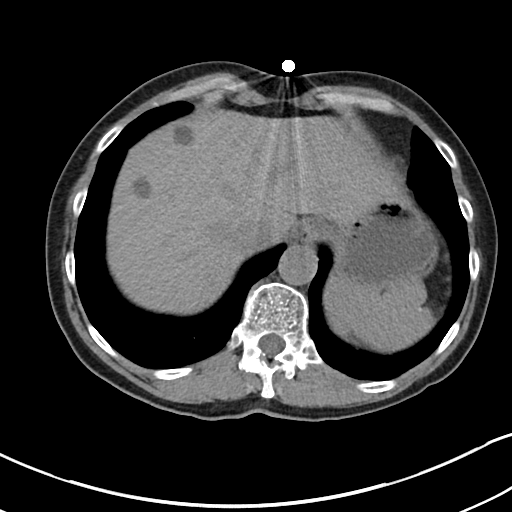
[im 88/94  soft-tissue]
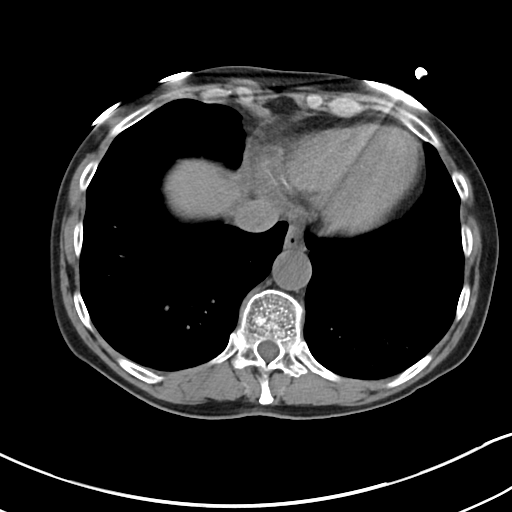

[Series 5: cor · coronal · 0.70mm/px · 3 of 93 slices shown]
[im 31/93  soft-tissue]
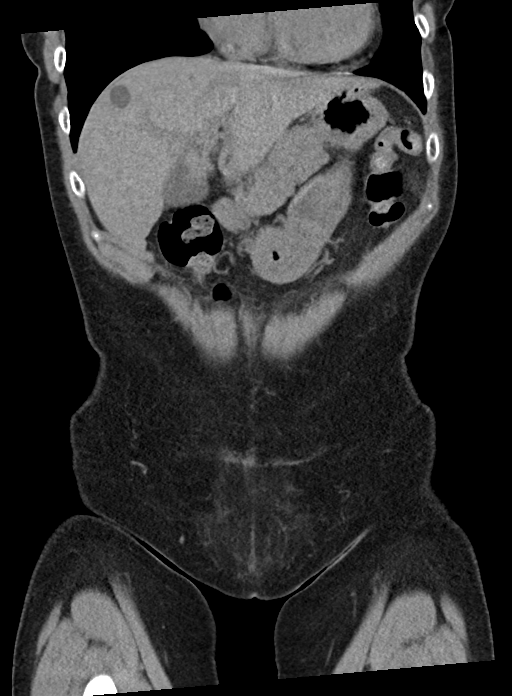
[im 41/93  soft-tissue]
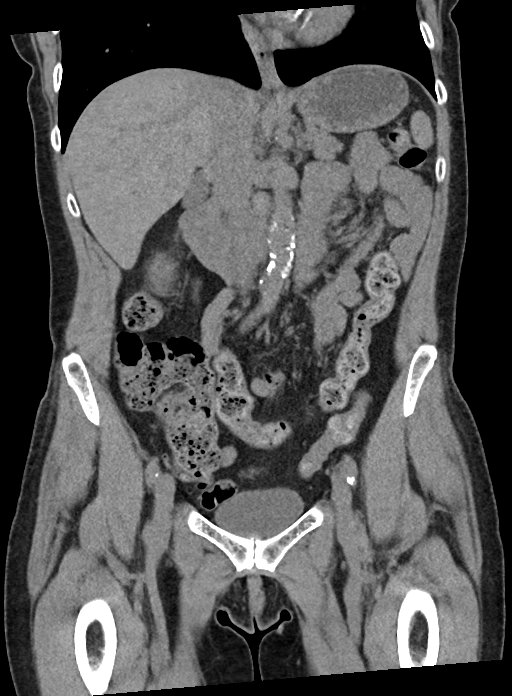
[im 52/93  soft-tissue]
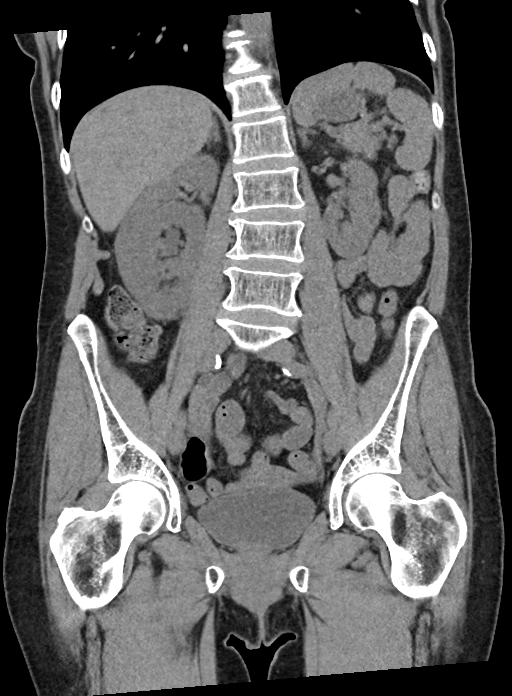

[16 of 46 positions shown; findings below may reference images not displayed]

FINDINGS: Lower chest: No acute abnormality.

Hepatobiliary: Stable hepatic cysts. No acute or suspicious findings
within the liver. Gallbladder appears normal. No bile duct
dilatation seen.

Pancreas: Unremarkable. No pancreatic ductal dilatation or
surrounding inflammatory changes.

Spleen: Normal in size without focal abnormality.

Adrenals/Urinary Tract: Adrenal glands appear normal. RIGHT
hydronephrosis, mild to moderate in degree, with associated
perinephric and periureteral inflammation. 2 mm stone within the
distal RIGHT ureter.

7 mm RIGHT renal stone.

LEFT kidney is unremarkable without stone or hydronephrosis.

Stomach/Bowel: No dilated large or small bowel loops. No evidence of
a focal bowel wall inflammation. Appendix is normal. Stomach is
unremarkable, partially decompressed.

Vascular/Lymphatic: Aortic atherosclerosis. No enlarged lymph nodes
seen.

Reproductive: Uterus and bilateral adnexa are unremarkable.

Other: No free fluid or abscess collection is seen. No free
intraperitoneal air.

Musculoskeletal: No acute or suspicious osseous abnormality. Mild
degenerative spondylosis of the slightly scoliotic thoracolumbar
spine.
IMPRESSION: 1. 2 mm stone within the distal RIGHT ureter, causing mild to
moderate RIGHT-sided hydronephrosis, and associated perinephric and
periureteral inflammation.
2. 7 mm RIGHT renal stone.
3. Appendix is normal.

Aortic Atherosclerosis ([id number]).

## 2023-12-05 IMAGING — CR DG ABDOMEN 1V
2 series · 2 of 2 positions shown · non-contrast
Comparison: CT 4 days ago 02/22/2022

CLINICAL DATA: Kidney stone.

EXAM:
ABDOMEN - 1 VIEW

[abdomen kub (1 of 2)]
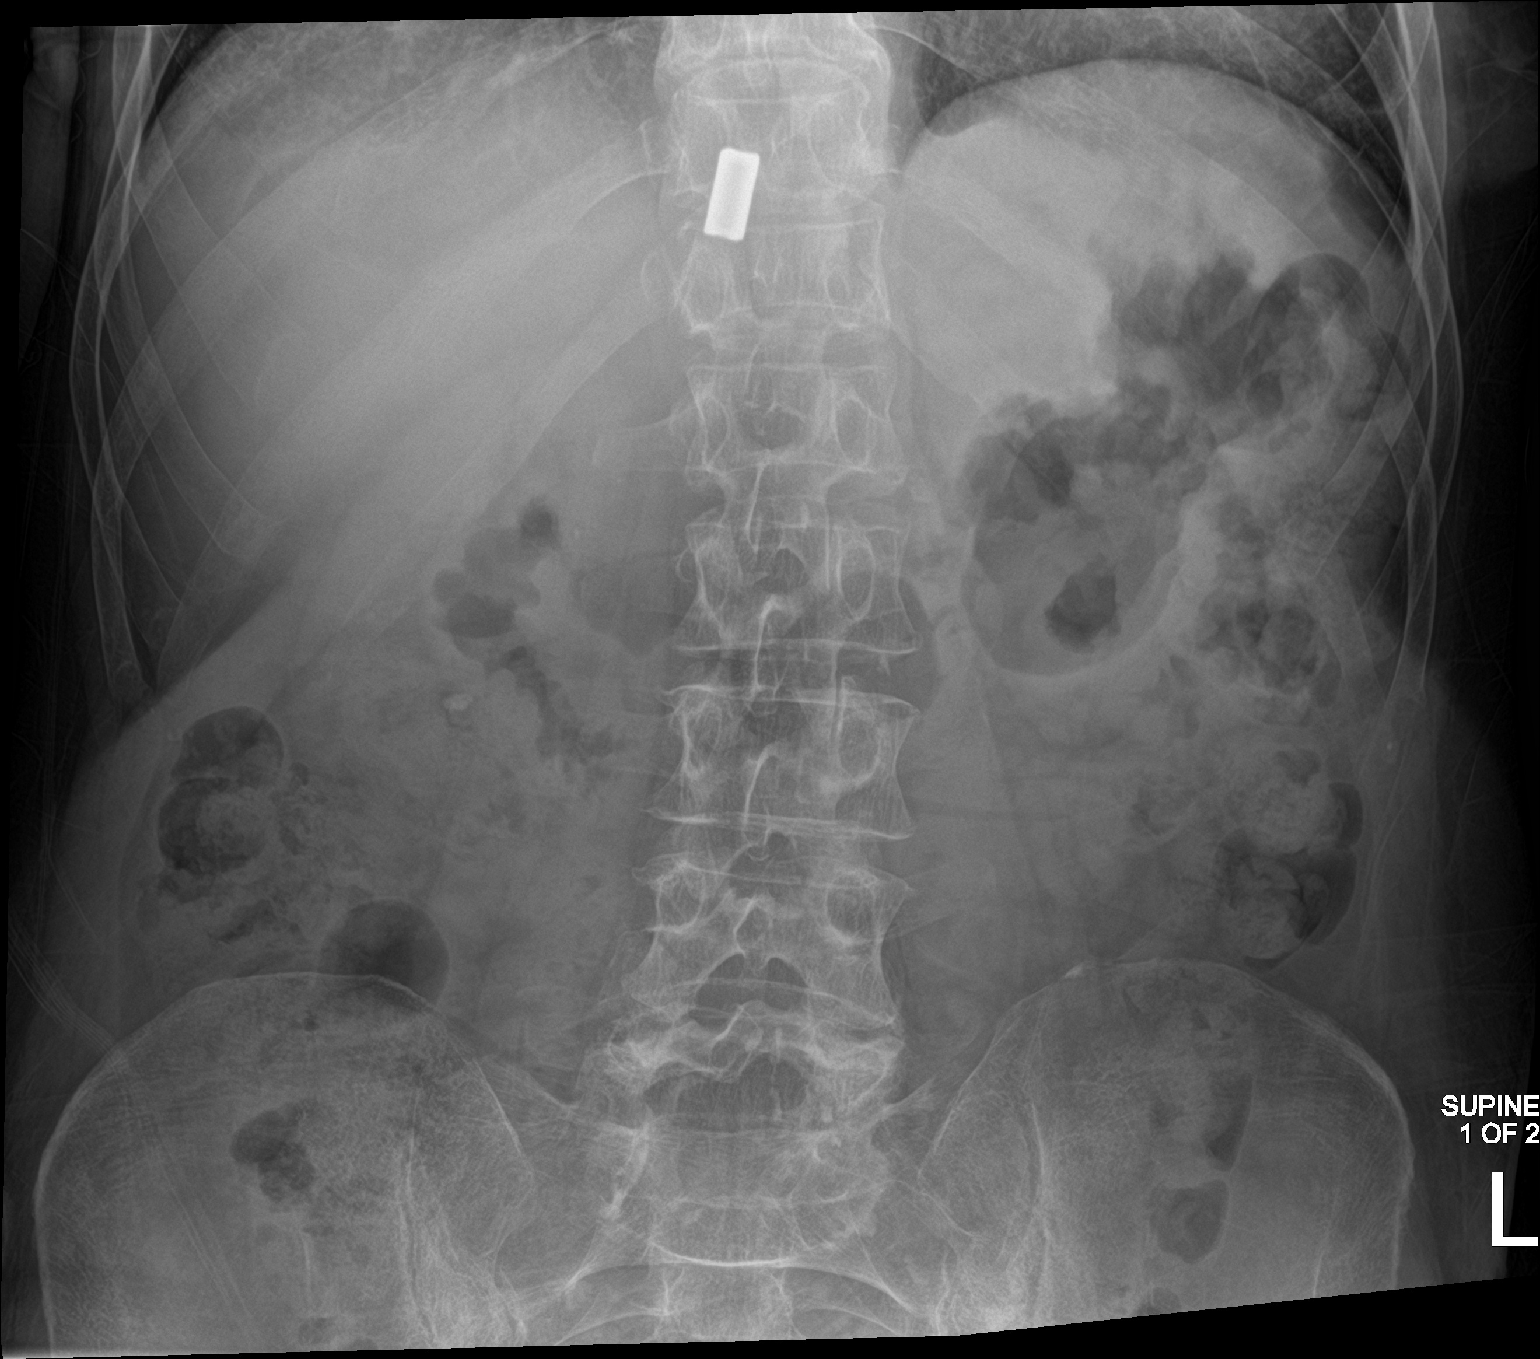

[abdomen kub (2 of 2)]
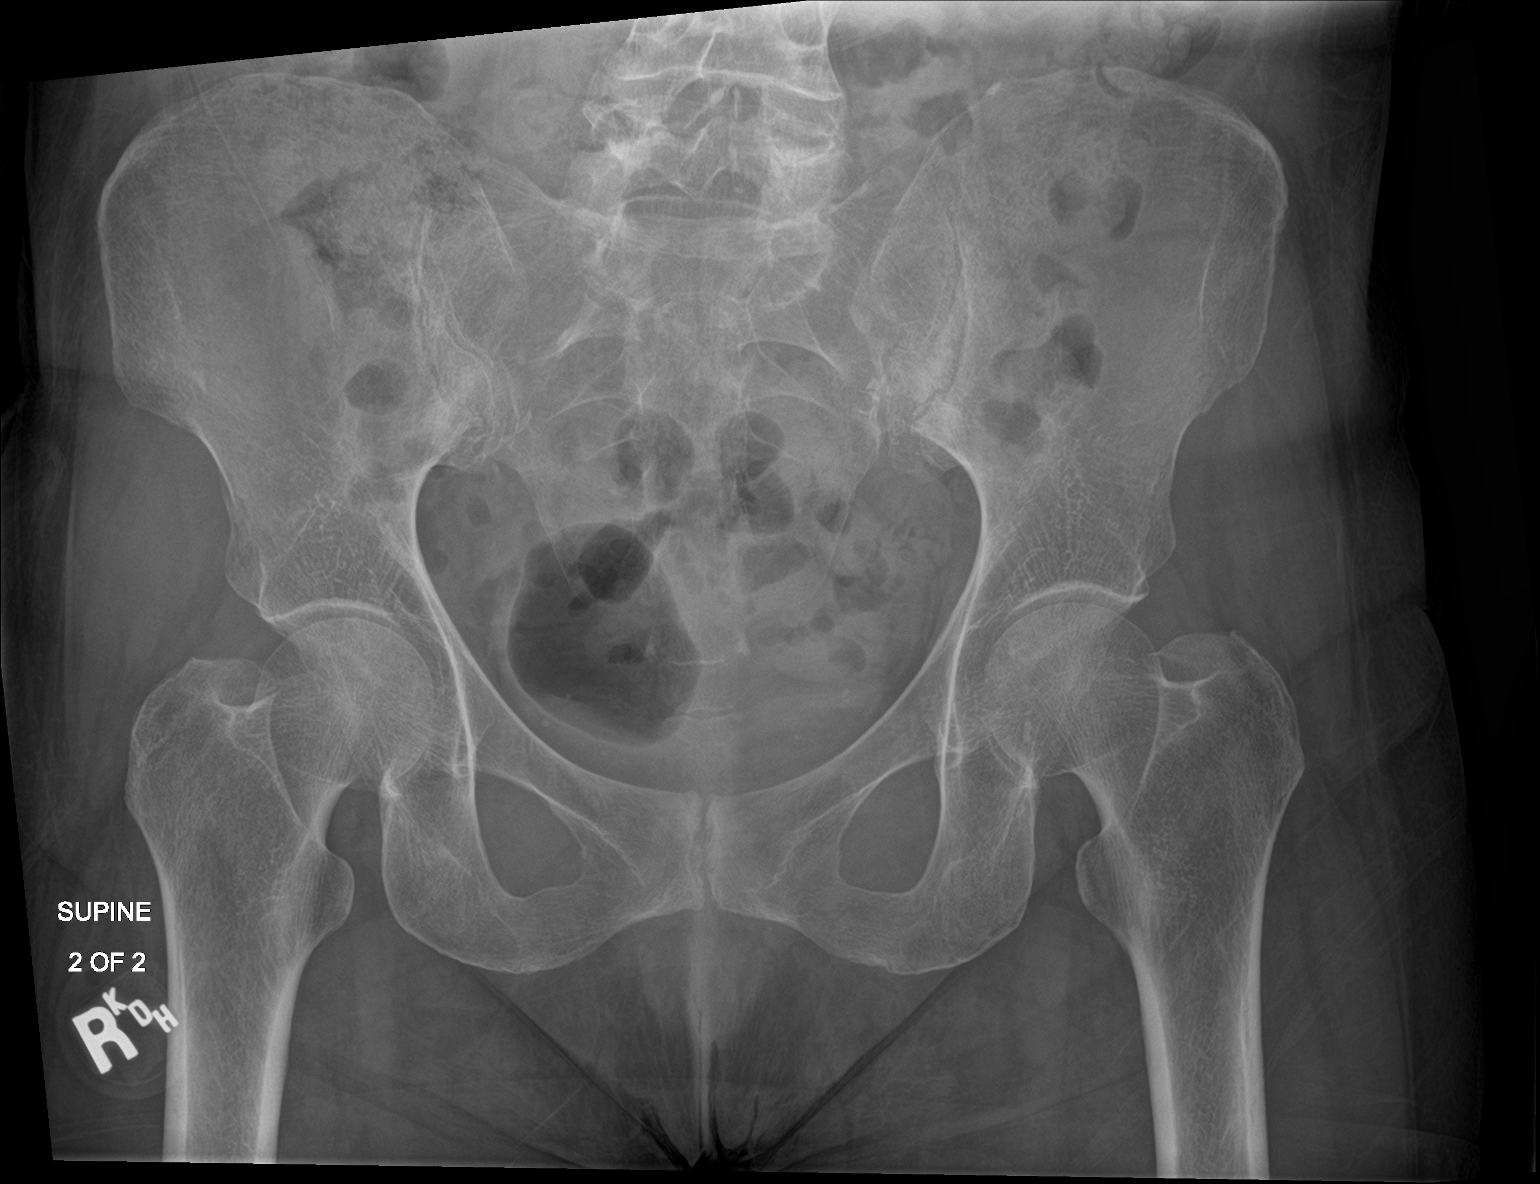

[2 of 2 positions shown; findings below may reference images not displayed]

FINDINGS: The tiny 2 mm distal right ureteric stone on prior CT is not
definitively seen on the prior exam, however there are multiple
regional phleboliths which limits detailed assessment. 6 mm stone
projects over the lower right kidney. Punctate left renal stone is
not definitively seen by radiograph. Normal bowel gas pattern with
moderate colonic stool. Rectangular density projecting over the
midline upper abdomen is presumably external to the patient. No
acute osseous abnormalities are seen.
IMPRESSION: 1. The tiny 2 mm distal right ureteric stone on prior CT is not
definitively seen on radiograph, however there are multiple regional
phleboliths which limits assessment.
2. A 6 mm right renal stone. Punctate left renal stone on CT is not
definitively seen by radiograph.

## 2023-12-20 ENCOUNTER — Telehealth: Payer: Self-pay | Admitting: Family Medicine

## 2023-12-20 DIAGNOSIS — I10 Essential (primary) hypertension: Secondary | ICD-10-CM

## 2023-12-20 DIAGNOSIS — E538 Deficiency of other specified B group vitamins: Secondary | ICD-10-CM

## 2023-12-20 DIAGNOSIS — E7800 Pure hypercholesterolemia, unspecified: Secondary | ICD-10-CM

## 2023-12-20 NOTE — Telephone Encounter (Signed)
-----   Message from Alvina Chou sent at 12/10/2023  9:47 AM EST ----- Regarding: Lab orders Tue, 1.4.25 Patient is scheduled for CPX labs, please order future labs, Thanks , Camelia Eng

## 2023-12-22 ENCOUNTER — Other Ambulatory Visit (INDEPENDENT_AMBULATORY_CARE_PROVIDER_SITE_OTHER): Payer: 59

## 2023-12-22 DIAGNOSIS — I10 Essential (primary) hypertension: Secondary | ICD-10-CM

## 2023-12-22 DIAGNOSIS — E7800 Pure hypercholesterolemia, unspecified: Secondary | ICD-10-CM | POA: Diagnosis not present

## 2023-12-22 DIAGNOSIS — E538 Deficiency of other specified B group vitamins: Secondary | ICD-10-CM | POA: Diagnosis not present

## 2023-12-22 LAB — CBC WITH DIFFERENTIAL/PLATELET
Basophils Absolute: 0.1 10*3/uL (ref 0.0–0.1)
Basophils Relative: 1.2 % (ref 0.0–3.0)
Eosinophils Absolute: 0.5 10*3/uL (ref 0.0–0.7)
Eosinophils Relative: 7 % — ABNORMAL HIGH (ref 0.0–5.0)
HCT: 47.7 % — ABNORMAL HIGH (ref 36.0–46.0)
Hemoglobin: 16.2 g/dL — ABNORMAL HIGH (ref 12.0–15.0)
Lymphocytes Relative: 36.4 % (ref 12.0–46.0)
Lymphs Abs: 2.5 10*3/uL (ref 0.7–4.0)
MCHC: 33.9 g/dL (ref 30.0–36.0)
MCV: 94.3 fL (ref 78.0–100.0)
Monocytes Absolute: 0.6 10*3/uL (ref 0.1–1.0)
Monocytes Relative: 8.2 % (ref 3.0–12.0)
Neutro Abs: 3.2 10*3/uL (ref 1.4–7.7)
Neutrophils Relative %: 47.2 % (ref 43.0–77.0)
Platelets: 233 10*3/uL (ref 150.0–400.0)
RBC: 5.06 Mil/uL (ref 3.87–5.11)
RDW: 13.2 % (ref 11.5–15.5)
WBC: 6.8 10*3/uL (ref 4.0–10.5)

## 2023-12-22 LAB — LIPID PANEL
Cholesterol: 179 mg/dL (ref 0–200)
HDL: 52.4 mg/dL (ref >39.00)
LDL Cholesterol: 108 mg/dL — ABNORMAL HIGH (ref 0–99)
NonHDL: 126.54
Total CHOL/HDL Ratio: 3
Triglycerides: 94 mg/dL (ref 0.0–149.0)
VLDL: 18.8 mg/dL (ref 0.0–40.0)

## 2023-12-22 LAB — COMPREHENSIVE METABOLIC PANEL
ALT: 16 U/L (ref 0–35)
AST: 12 U/L (ref 0–37)
Albumin: 4.5 g/dL (ref 3.5–5.2)
Alkaline Phosphatase: 94 U/L (ref 39–117)
BUN: 15 mg/dL (ref 6–23)
CO2: 27 meq/L (ref 19–32)
Calcium: 9.3 mg/dL (ref 8.4–10.5)
Chloride: 105 meq/L (ref 96–112)
Creatinine, Ser: 0.83 mg/dL (ref 0.40–1.20)
GFR: 75.19 mL/min (ref >60.00)
Glucose, Bld: 100 mg/dL — ABNORMAL HIGH (ref 70–99)
Potassium: 4.3 meq/L (ref 3.5–5.1)
Sodium: 141 meq/L (ref 135–145)
Total Bilirubin: 0.6 mg/dL (ref 0.2–1.2)
Total Protein: 6.5 g/dL (ref 6.0–8.3)

## 2023-12-22 LAB — TSH: TSH: 1.68 u[IU]/mL (ref 0.35–5.50)

## 2023-12-22 LAB — VITAMIN B12: Vitamin B-12: 742 pg/mL (ref 211–911)

## 2023-12-29 ENCOUNTER — Other Ambulatory Visit (HOSPITAL_COMMUNITY)
Admission: RE | Admit: 2023-12-29 | Discharge: 2023-12-29 | Disposition: A | Discharge status: Home / Self Care | Payer: 59 | Source: Ambulatory Visit | Attending: Family Medicine | Admitting: Family Medicine

## 2023-12-29 ENCOUNTER — Other Ambulatory Visit: Payer: Self-pay | Admitting: Physician Assistant

## 2023-12-29 ENCOUNTER — Ambulatory Visit (INDEPENDENT_AMBULATORY_CARE_PROVIDER_SITE_OTHER): Payer: 59 | Admitting: Family Medicine

## 2023-12-29 ENCOUNTER — Encounter: Payer: Self-pay | Admitting: Family Medicine

## 2023-12-29 VITALS — BP 108/68 | HR 67 | Temp 98.2°F | Ht 67.0 in | Wt 154.4 lb

## 2023-12-29 DIAGNOSIS — Z0000 Encounter for general adult medical examination without abnormal findings: Secondary | ICD-10-CM

## 2023-12-29 DIAGNOSIS — Z124 Encounter for screening for malignant neoplasm of cervix: Secondary | ICD-10-CM

## 2023-12-29 DIAGNOSIS — E7800 Pure hypercholesterolemia, unspecified: Secondary | ICD-10-CM

## 2023-12-29 DIAGNOSIS — R413 Other amnesia: Secondary | ICD-10-CM | POA: Diagnosis not present

## 2023-12-29 DIAGNOSIS — I10 Essential (primary) hypertension: Secondary | ICD-10-CM

## 2023-12-29 DIAGNOSIS — F17200 Nicotine dependence, unspecified, uncomplicated: Secondary | ICD-10-CM | POA: Diagnosis not present

## 2023-12-29 DIAGNOSIS — F32A Depression, unspecified: Secondary | ICD-10-CM

## 2023-12-29 DIAGNOSIS — R682 Dry mouth, unspecified: Secondary | ICD-10-CM

## 2023-12-29 DIAGNOSIS — F419 Anxiety disorder, unspecified: Secondary | ICD-10-CM

## 2023-12-29 DIAGNOSIS — Z23 Encounter for immunization: Secondary | ICD-10-CM

## 2023-12-29 DIAGNOSIS — R7303 Prediabetes: Secondary | ICD-10-CM

## 2023-12-29 DIAGNOSIS — R399 Unspecified symptoms and signs involving the genitourinary system: Secondary | ICD-10-CM

## 2023-12-29 DIAGNOSIS — Z01419 Encounter for gynecological examination (general) (routine) without abnormal findings: Secondary | ICD-10-CM | POA: Diagnosis not present

## 2023-12-29 DIAGNOSIS — R7309 Other abnormal glucose: Secondary | ICD-10-CM

## 2023-12-29 DIAGNOSIS — E538 Deficiency of other specified B group vitamins: Secondary | ICD-10-CM | POA: Diagnosis not present

## 2023-12-29 DIAGNOSIS — Z1211 Encounter for screening for malignant neoplasm of colon: Secondary | ICD-10-CM

## 2023-12-29 MED ORDER — AMLODIPINE BESYLATE 5 MG PO TABS: 5.0000 mg | ORAL_TABLET | Freq: Every day | ORAL | 3 refills | Status: AC

## 2023-12-29 MED ORDER — CEVIMELINE HCL 30 MG PO CAPS: ORAL_CAPSULE | ORAL | 3 refills | Status: AC

## 2023-12-29 MED ORDER — FLUOXETINE HCL 20 MG PO CAPS: 20.0000 mg | ORAL_CAPSULE | Freq: Every day | ORAL | 3 refills | Status: DC

## 2023-12-29 NOTE — Patient Instructions (Addendum)
If you are interested in the shingles vaccine series (Shingrix), call your insurance or pharmacy to check on coverage and location it must be given.  If affordable - you can schedule it here or at your pharmacy depending on coverage   Pneumonia vaccine today   Pap today   You are due for a colonoscopy  [facility]  [phone]   For bone health  Try to get 1200-1500 mg of calcium per day with at least 2000 iu of vitamin D - for bone health    Keep walking  Add some strength training to your routine, this is important for bone and brain health and can reduce your risk of falls and help your body use insulin properly and regulate weight  Light weights, exercise bands , and internet videos are a good way to start  Yoga (chair or regular), machines , floor exercises or a gym with machines are also good options    I will place a referral to pharmacy here to discuss cholesterol options  If you don't get a call in 1-2 weeks let us know    To prevent diabetes Try to get most of your carbohydrates from produce (with the exception of white potatoes) and whole grains Eat less bread/pasta/rice/snack foods/cereals/sweets and other items from the middle of the grocery store (processed carbs)  Change your daily cookie to an apple (have the cookie once per week rather than daily)   B12 is good  Let's re check in about 2 months  Keep taking the B12   Read about the lung cancer screening program If interested let me know

## 2023-12-29 NOTE — Assessment & Plan Note (Signed)
Lab Results  Component Value Date   HGBA1C 5.8 10/27/2023   HGBA1C 5.8 09/18/2022   HGBA1C 5.5 09/06/2021    disc imp of low glycemic diet and wt loss to prevent DM2

## 2023-12-29 NOTE — Assessment & Plan Note (Addendum)
Due for 5 year colonoscopy  GI referral done  Pt will call to schedule that

## 2023-12-29 NOTE — Assessment & Plan Note (Signed)
Continues 1ppd Disc in detail risks of smoking and possible outcomes including copd, vascular/ heart disease, cancer , respiratory and sinus infections as well as osteoporosis  Pt voices understanding  Not ready to quit Given info on lung cancer screening program  She will review it and let us know if interested

## 2023-12-29 NOTE — Assessment & Plan Note (Signed)
May have had some improvement with treatment of B12 def Also with treatment of depression with fluoxetine Will continue to monitor   Encouraged brain and body exercise regularly

## 2023-12-29 NOTE — Assessment & Plan Note (Signed)
Exam and pap with HPV screen No complaintes

## 2023-12-29 NOTE — Progress Notes (Signed)
Subjective:    Patient ID: Carolyn Cordova, female    DOB: 10/25/1961, 63 y.o.   MRN: [id number]  HPI  Here for health maintenance exam and to review chronic medical problems   Wt Readings from Last 3 Encounters:  12/29/23 154 lb 6 oz (70 kg)  11/19/23 157 lb 8 oz (71.4 kg)  10/27/23 159 lb (72.1 kg)   24.18 kg/m  Vitals:   12/29/23 0934  BP: 108/68  Pulse: 67  Temp: 98.2 F (36.8 C)  SpO2: 98%    Immunization History  Administered Date(s) Administered   H1N1 11/29/2008   Influenza Split 08/26/2011   Influenza Whole 09/11/2005, 08/19/2007, 08/15/2008   Influenza, Seasonal, Injecte, Preservative Fre 10/27/2023   Influenza,inj,Quad PF,6+ Mos 08/26/2013, 07/30/2016, 08/05/2017, 09/09/2018, 10/07/2019, 09/25/2022   PFIZER(Purple Top)SARS-COV-2 Vaccination 02/12/2020, 03/07/2020   Pneumococcal Polysaccharide-23 11/29/2008   Td 08/22/2003, 10/27/2023   Tdap 06/17/2013    Health Maintenance Due  Topic Date Due   Pneumococcal Vaccine 62-70 Years old (2 of 2 - PCV) 11/29/2009   Cervical Cancer Screening (HPV/Pap Cotest)  09/02/2022   Colonoscopy  09/30/2023   Pna vaccine was 2010  pna 23   Shingrix- is interested in later     Smoking status avg 1 ppd / trying to cut back   Mammogram 02/2023 - had addn view for stable b9 asymmetry in med right breast since 2016   Self breast exam: no lumps   Gyn health  No problems  Will do pap today    Colon cancer screening    colonoscopy 09/2018  with 5 y recall for polyps  Dr Lavon Paganini  Wants to schedule that   Bone health   Falls-none  Fractures-none  Supplements - ca and D   Exercise :  Walking regularly outdoors in woods      Mood    12/29/2023    9:39 AM 11/19/2023   10:39 AM 10/31/2022   11:05 AM 09/25/2022    9:17 AM 03/13/2021    8:58 AM  Depression screen PHQ 2/9  Decreased Interest 0 0 3 1 1   Down, Depressed, Hopeless 0 0 3 1 1   PHQ - 2 Score 0 0 6 2 2   Altered sleeping 0 0 3 1 1   Tired,  decreased energy 1 1 3 1 1   Change in appetite 0 0 0 0 1  Feeling bad or failure about yourself  0 0 3 0 1  Trouble concentrating 0 0 3 1 1   Moving slowly or fidgety/restless 0 0 0 0 0  Suicidal thoughts 0 0 0 0 0  PHQ-9 Score 1 1 18 5 7   Difficult doing work/chores Not difficult at all Not difficult at all Very difficult     Continues fluoxetine 20 mg daily   Watching memory changes  Last MMSE 28.5 out of 30  Possible MCI  Thinks this is a bit better with the vitamin B12 and better mood     HTN bp is stable today  No cp or palpitations or headaches or edema  No side effects to medicines  BP Readings from Last 3 Encounters:  12/29/23 108/68  11/19/23 116/70  10/27/23 124/68    Amlodipine 5 mg daily   Takes asa 81 mg daily   Sjogren's  Uses cevimeline for saliva   Lab Results  Component Value Date   NA 141 12/22/2023   K 4.3 12/22/2023   CO2 27 12/22/2023   GLUCOSE 100 (H) 12/22/2023   BUN  15 12/22/2023   CREATININE 0.83 12/22/2023   CALCIUM 9.3 12/22/2023   GFR 75.19 12/22/2023   GFRNONAA >60 02/22/2022      Hyperlipidemia Lab Results  Component Value Date   CHOL 179 12/22/2023   CHOL 194 10/27/2023   CHOL 160 09/18/2022   Lab Results  Component Value Date   HDL 52.40 12/22/2023   HDL 57.00 10/27/2023   HDL 53.20 09/18/2022   Lab Results  Component Value Date   LDLCALC 108 (H) 12/22/2023   LDLCALC 115 (H) 10/27/2023   LDLCALC 94 09/18/2022   Lab Results  Component Value Date   TRIG 94.0 12/22/2023   TRIG 109.0 10/27/2023   TRIG 63.0 09/18/2022   Lab Results  Component Value Date   CHOLHDL 3 12/22/2023   CHOLHDL 3 10/27/2023   CHOLHDL 3 09/18/2022   Lab Results  Component Value Date   LDLDIRECT 150.0 08/05/2017   LDLDIRECT 217.5 12/11/2010   LDLDIRECT 185.0 05/10/2009   Crestor 5 mg daily  Cannot tolerate more than that    Had cardiac ca score in 2022 that was in 83rd percentile  Saw Dr Rennis Golden -cardiology in light of intol of  higher statin dose  Discussed possible pcyk9  Glucose  Lab Results  Component Value Date   HGBA1C 5.8 10/27/2023   HGBA1C 5.8 09/18/2022   HGBA1C 5.5 09/06/2021   Eats a little debbie oatmeal cookie   B12 def  Lab Results  Component Value Date   VITAMINB12 742 12/22/2023   Had 4 weekly shots  Taking 1000 mcg daily    Patient Active Problem List   Diagnosis Date Noted   B12 deficiency 10/28/2023   Memory changes 10/27/2023   Anxiety and depression 10/31/2022   Encounter for screening mammogram for breast cancer 09/25/2022   Grief 09/25/2022   Prediabetes 09/06/2021   Colon cancer screening 04/10/2015   Internal hemorrhoid 04/10/2015   Encounter for routine gynecological examination 03/17/2014   Routine general medical examination at a health care facility 03/09/2014   H/O herpes zoster 08/09/2012   Dry mouth 04/20/2012   Elevated rheumatoid factor 04/20/2012   HERNIATED CERVICAL DISC 09/14/2009   Pure hypercholesterolemia 05/10/2009   TOBACCO ABUSE 08/19/2007   CARPAL TUNNEL SYNDROME, BILATERAL 08/19/2007   Essential hypertension 08/19/2007   HEMORRHOIDS 08/19/2007   History of cervical dysplasia 08/19/2007   MIGRAINES, HX OF 08/19/2007   Past Medical History:  Diagnosis Date   Allergy    seasonal   Carpal tunnel syndrome    Cervical disc disease    GERD (gastroesophageal reflux disease)    Hyperlipidemia    Hypertension    Migraine    PMDD (premenstrual dysphoric disorder)    Past Surgical History:  Procedure Laterality Date   BREAST BIOPSY Left    unknown when, X-clip, benign   CESAREAN SECTION     COLONOSCOPY     CRYOABLATION  09/1989   secondary to dysplasia   EXTRACORPOREAL SHOCK WAVE LITHOTRIPSY Right 10/02/2022   Procedure: EXTRACORPOREAL SHOCK WAVE LITHOTRIPSY (ESWL);  Surgeon: Riki Altes, MD;  Location: [facility];  Service: Urology;  Laterality: Right;   EXTRACORPOREAL SHOCK WAVE LITHOTRIPSY Right 10/16/2022   Procedure: EXTRACORPOREAL  SHOCK WAVE LITHOTRIPSY (ESWL);  Surgeon: Vanna Scotland, MD;  Location: [facility];  Service: Urology;  Laterality: Right;   lipoma removal  2006   PILONIDAL CYST EXCISION  1980s   x2   plantar fasciitis  06/2011   surgery with Dr Al Corpus   POLYPECTOMY  SPINE SURGERY  01/2010   cervical spine surgery   Social History   Tobacco Use   Smoking status: Every Day    Current packs/day: 1.00    Types: Cigarettes   Smokeless tobacco: Never  Vaping Use   Vaping status: Never Used  Substance Use Topics   Alcohol use: No    Alcohol/week: 0.0 standard drinks of alcohol   Drug use: No   Family History  Problem Relation Age of Onset   Hyperlipidemia Mother    Cancer Mother        breast CA   Breast cancer Mother    Cancer Father        adrenal cancer   Colon polyps Father    Hyperlipidemia Father    Breast cancer Maternal Aunt    Breast cancer Paternal Aunt    Breast cancer Maternal Grandmother    Cancer Cousin        breast CA   Hyperlipidemia Brother    Colon cancer Neg Hx    Esophageal cancer Neg Hx    Rectal cancer Neg Hx    Stomach cancer Neg Hx    Allergies  Allergen Reactions   Adhesive [Tape]    Amoxicillin-Pot Clavulanate     REACTION: rash   Codeine     REACTION: ? reaction   Fexofenadine     REACTION: nausea   Sertraline Hcl     REACTION: sedation   Varenicline Tartrate     REACTION: made her feel sick and did not work   Wellbutrin [Bupropion] Other (See Comments)   Current Outpatient Medications on File Prior to Visit  Medication Sig Dispense Refill   amLODipine (NORVASC) 5 MG tablet Take 1 tablet (5 mg total) by mouth daily. 90 tablet 1   aspirin 81 MG tablet Take 81 mg by mouth daily.     cetirizine (ZYRTEC) 10 MG tablet Take 10 mg by mouth daily.     cevimeline (EVOXAC) 30 MG capsule TAKE 1 CAPSULE BY MOUTH 3 TIMES A DAY 270 capsule 1   cyanocobalamin (VITAMIN B12) 1000 MCG tablet Take 1,000 mcg by mouth daily.     FLUoxetine (PROZAC) 20 MG capsule  Take 1 capsule (20 mg total) by mouth daily. 90 capsule 1   rosuvastatin (CRESTOR) 5 MG tablet TAKE 1 TABLET (5 MG TOTAL) BY MOUTH DAILY. 90 tablet 1   tamsulosin (FLOMAX) 0.4 MG CAPS capsule TAKE 1 CAPSULE BY MOUTH EVERY DAY 90 capsule 3   No current facility-administered medications on file prior to visit.    Review of Systems  Constitutional:  Negative for activity change, appetite change, fatigue, fever and unexpected weight change.  HENT:  Negative for congestion, ear pain, rhinorrhea, sinus pressure and sore throat.   Eyes:  Negative for pain, redness and visual disturbance.  Respiratory:  Negative for cough, shortness of breath and wheezing.   Cardiovascular:  Negative for chest pain and palpitations.  Gastrointestinal:  Negative for abdominal pain, blood in stool, constipation and diarrhea.  Endocrine: Negative for polydipsia and polyuria.  Genitourinary:  Negative for dysuria, frequency and urgency.  Musculoskeletal:  Negative for arthralgias, back pain and myalgias.  Skin:  Negative for pallor and rash.  Allergic/Immunologic: Negative for environmental allergies.  Neurological:  Negative for dizziness, syncope and headaches.  Hematological:  Negative for adenopathy. Does not bruise/bleed easily.  Psychiatric/Behavioral:  Negative for decreased concentration and dysphoric mood. The patient is not nervous/anxious.        Objective:  Physical Exam Exam conducted with a chaperone present.  Constitutional:      General: She is not in acute distress.    Appearance: Normal appearance. She is well-developed. She is not ill-appearing or diaphoretic.  HENT:     Head: Normocephalic and atraumatic.     Right Ear: Tympanic membrane, ear canal and external ear normal.     Left Ear: Tympanic membrane, ear canal and external ear normal.     Nose: Nose normal. No congestion.     Mouth/Throat:     Mouth: Mucous membranes are moist.     Pharynx: Oropharynx is clear. No posterior  oropharyngeal erythema.  Eyes:     General: No scleral icterus.    Extraocular Movements: Extraocular movements intact.     Conjunctiva/sclera: Conjunctivae normal.     Pupils: Pupils are equal, round, and reactive to light.  Neck:     Thyroid: No thyromegaly.     Vascular: No carotid bruit or JVD.  Cardiovascular:     Rate and Rhythm: Normal rate and regular rhythm.     Pulses: Normal pulses.     Heart sounds: Normal heart sounds.     No gallop.  Pulmonary:     Effort: Pulmonary effort is normal. No respiratory distress.     Breath sounds: Normal breath sounds. No stridor. No wheezing or rhonchi.     Comments: Diffusely distant bs No wheeze  Scant upper airway sounds  Chest:     Chest wall: No tenderness.  Abdominal:     General: Bowel sounds are normal. There is no distension or abdominal bruit.     Palpations: Abdomen is soft. There is no mass.     Tenderness: There is no abdominal tenderness.     Hernia: No hernia is present.  Genitourinary:    Exam position: Supine.     Comments: Breast exam: No mass, nodules, thickening, tenderness, bulging, retraction, inflamation, nipple discharge or skin changes noted.  No axillary or clavicular LA.                 Anus appears normal w/o hemorrhoids or masses       External genitalia : nl appearance and hair distribution/no lesions       Urethral meatus : nl size, no lesions or prolapse       Urethra: no masses, tenderness or scarring      Bladder : no masses or tenderness       Vagina: nl general appearance, no discharge or  Lesions, no significant cystocele  or rectocele       Cervix: no lesions/ discharge or friability      Uterus: nl size, contour, position, and mobility (not fixed) , non tender      Adnexa : no masses, tenderness, enlargement or nodularity            Musculoskeletal:        General: No tenderness. Normal range of motion.     Cervical back: Normal range of motion and neck supple. No rigidity. No muscular  tenderness.     Right lower leg: No edema.     Left lower leg: No edema.     Comments: No kyphosis   Lymphadenopathy:     Cervical: No cervical adenopathy.  Skin:    General: Skin is warm and dry.     Coloration: Skin is not pale.     Findings: No erythema or rash.     Comments: Solar lentigines diffusely  Neurological:     Mental Status: She is alert. Mental status is at baseline.     Cranial Nerves: No cranial nerve deficit.     Motor: No abnormal muscle tone.     Coordination: Coordination normal.     Gait: Gait normal.     Deep Tendon Reflexes: Reflexes are normal and symmetric. Reflexes normal.  Psychiatric:        Mood and Affect: Mood normal.        Cognition and Memory: Cognition and memory normal.     Comments: Mentally sharp today           Assessment & Plan:   Problem List Items Addressed This Visit       Cardiovascular and Mediastinum   Essential hypertension   bp in fair control at this time  BP Readings from Last 1 Encounters:  12/29/23 108/68   No changes needed Most recent labs reviewed  Disc lifstyle change with low sodium diet and exercise  Continues amlodipine 5 mg daily  Encouraged smoking cessation          Other   TOBACCO ABUSE   Continues 1ppd Disc in detail risks of smoking and possible outcomes including copd, vascular/ heart disease, cancer , respiratory and sinus infections as well as osteoporosis  Pt voices understanding  Not ready to quit Given info on lung cancer screening program  She will review it and let us know if interested       Routine general medical examination at a health care facility - Primary   Reviewed health habits including diet and exercise and skin cancer prevention Reviewed appropriate screening tests for age  Also reviewed health mt list, fam hx and immunization status , as well as social and family history   See HPI Labs reviewed and ordered Health Maintenance  Topic Date Due   Pneumococcal  Vaccination (2 of 2 - PCV) 11/29/2009   Pap with HPV screening  09/02/2022   Colon Cancer Screening  09/30/2023   Zoster (Shingles) Vaccine (1 of 2) 01/25/2024*   COVID-19 Vaccine (3 - 2024-25 season) 11/11/2024*   Mammogram  03/02/2024   DTaP/Tdap/Td vaccine (4 - Td or Tdap) 10/26/2033   Flu Shot  Completed   Hepatitis C Screening  Completed   HIV Screening  Completed   HPV Vaccine  Aged Out  *Topic was postponed. The date shown is not the original due date.   Prevnar 20 vaccine today GI ref for colonoscopy  Discussed smoking cessation  Discussed fall prevention, supplements and exercise for bone density  PHQ 1   improved Given info on lung cancer screening program to review and update if interested  Plans to check insurance cov of shingrix vaccine for future        Pure hypercholesterolemia   Disc goals for lipids and reasons to control them Rev last labs with pt Rev low sat fat diet in detail   LDL of 108  Would like this lower in light of positive cardiac ca score and smoking She only tolerates 5 mg of crestor /no more  Reviewed last cardiology note Dr Rennis Golden 2022   Will ref to pharm  Would like to consider pcyk9 but so far not affordable       Relevant Orders   AMB Referral VBCI Care Management   Prediabetes   Lab Results  Component Value Date   HGBA1C 5.8 10/27/2023   HGBA1C 5.8 09/18/2022   HGBA1C 5.5 09/06/2021    disc  imp of low glycemic diet and wt loss to prevent DM2       Memory changes   May have had some improvement with treatment of B12 def Also with treatment of depression with fluoxetine Will continue to monitor   Encouraged brain and body exercise regularly       Encounter for routine gynecological examination   Exam and pap with HPV screen No complaintes       Relevant Orders   Cytology - PAP([facility])   Dry mouth   Continues evoxac       Colon cancer screening   Due for 5 year colonoscopy  GI referral done  Pt will call to  schedule that       Relevant Orders   Ambulatory referral to Gastroenterology   B12 deficiency   Lab Results  Component Value Date   VITAMINB12 742 12/22/2023   After 4 weekly shots then 1000 mcg daily  Re check 2 mo  If low will make a shot schedule   Some improvement in cognition       Anxiety and depression   Continues fluoxetine 20 mg daily  Is helpful  Encouraged good self care including exercise

## 2023-12-29 NOTE — Assessment & Plan Note (Signed)
Continues evoxac

## 2023-12-29 NOTE — Assessment & Plan Note (Signed)
Lab Results  Component Value Date   VITAMINB12 742 12/22/2023   After 4 weekly shots then 1000 mcg daily  Re check 2 mo  If low will make a shot schedule   Some improvement in cognition

## 2023-12-29 NOTE — Assessment & Plan Note (Signed)
bp in fair control at this time  BP Readings from Last 1 Encounters:  12/29/23 108/68   No changes needed Most recent labs reviewed  Disc lifstyle change with low sodium diet and exercise  Continues amlodipine 5 mg daily  Encouraged smoking cessation

## 2023-12-29 NOTE — Assessment & Plan Note (Addendum)
Reviewed health habits including diet and exercise and skin cancer prevention Reviewed appropriate screening tests for age  Also reviewed health mt list, fam hx and immunization status , as well as social and family history   See HPI Labs reviewed and ordered Health Maintenance  Topic Date Due   Pneumococcal Vaccination (2 of 2 - PCV) 11/29/2009   Pap with HPV screening  09/02/2022   Colon Cancer Screening  09/30/2023   Zoster (Shingles) Vaccine (1 of 2) 01/25/2024*   COVID-19 Vaccine (3 - 2024-25 season) 11/11/2024*   Mammogram  03/02/2024   DTaP/Tdap/Td vaccine (4 - Td or Tdap) 10/26/2033   Flu Shot  Completed   Hepatitis C Screening  Completed   HIV Screening  Completed   HPV Vaccine  Aged Out  *Topic was postponed. The date shown is not the original due date.   Prevnar 20 vaccine today GI ref for colonoscopy  Discussed smoking cessation  Discussed fall prevention, supplements and exercise for bone density  PHQ 1   improved Given info on lung cancer screening program to review and update if interested  Plans to check insurance cov of shingrix vaccine for future

## 2023-12-29 NOTE — Assessment & Plan Note (Signed)
Disc goals for lipids and reasons to control them Rev last labs with pt Rev low sat fat diet in detail   LDL of 108  Would like this lower in light of positive cardiac ca score and smoking She only tolerates 5 mg of crestor /no more  Reviewed last cardiology note Dr Rennis Golden 2022   Will ref to pharm  Would like to consider pcyk9 but so far not affordable

## 2023-12-29 NOTE — Assessment & Plan Note (Signed)
Continues fluoxetine 20 mg daily  Is helpful  Encouraged good self care including exercise

## 2023-12-31 ENCOUNTER — Encounter: Payer: Self-pay | Admitting: Family Medicine

## 2023-12-31 LAB — CYTOLOGY - PAP
Comment: NEGATIVE
Diagnosis: NEGATIVE
High risk HPV: NEGATIVE

## 2024-01-06 ENCOUNTER — Telehealth: Payer: Self-pay

## 2024-01-06 NOTE — Progress Notes (Signed)
Care Guide Pharmacy Note  01/06/2024 Name: Carolyn Cordova MRN: [id number] DOB: [birth year]  Referred By: [name], MD Reason for referral: Care Coordination (Outreach to schedule with Pharm d )   Carolyn Cordova is a 63 y.o. year old female who is a primary care patient of Tower, Audrie Gallus, MD.  Carolyn Cordova was referred to the pharmacist for assistance related to: HLD  Successful contact was made with the patient to discuss pharmacy services including being ready for the pharmacist to call at least 5 minutes before the scheduled appointment time and to have medication bottles and any blood pressure readings ready for review. The patient agreed to meet with the pharmacist via telephone visit on (date/time).01/07/2024  Carolyn Cordova , RMA     [place]  [facility], [facility] Guide  Direct Dial: [phone]  Website: [email or web address]

## 2024-01-07 ENCOUNTER — Other Ambulatory Visit (INDEPENDENT_AMBULATORY_CARE_PROVIDER_SITE_OTHER): Payer: 59 | Admitting: Pharmacist

## 2024-01-07 DIAGNOSIS — F17200 Nicotine dependence, unspecified, uncomplicated: Secondary | ICD-10-CM

## 2024-01-07 DIAGNOSIS — E7800 Pure hypercholesterolemia, unspecified: Secondary | ICD-10-CM

## 2024-01-07 NOTE — Progress Notes (Signed)
 01/07/2024 Name: Carolyn Cordova MRN: [id number] DOB: December 13, 1960  Subjective  Chief Complaint  Patient presents with   Hyperlipidemia    Reason for visit: ?  Carolyn Cordova is a 63 y.o. female who presents today for an initial pharmacotherapy visit related to hypercholesterolemia and Cardiorenal risk reduction.? Pertinent PMH includes HTN, HLD, tobacco use, pre-diabetes.  Care Team: Primary Care Provider: [name], MD  Date of Last Visit: with PCP on 12/29/23   Recent Summary of Change: Hyperlipidemia in smoker with positive cardiac ca score. On crestor 5 mg (only statin/only dose tolerated) Would like to consider pcsk9i but has been unaffordable).    Medication Access/Adherence: Prescription drug coverage: Payor: A[employer]NA / Plan: [employer] [facility]  / Product Type: *No Product type* / .  Reports that all medications are affordable. Not sure if she has a deductible  Current Patient Assistance: None Medication Adherence: Patient denies missing doses of their medication.     History of Present Illness: ?  Tolerating 5 mg rosuvastatin well at this time though has been unable to tolerate higher doses due to bilateral persistent leg pain, only resolved with stopping or reducing dose of rosuvastatin.   Reported Lipid Regimen: ?  Rosuvastatin 5 mg daily  Lipid-lowering medications tried in the past:?  Atorvastatin 20 mg - Prescribed 2016. D/c 2018 due to leg pain Rosuvastatin 10 mg (from 2012 to 2016. Stopped 2016 due to insurance change). Reports tolerating brand Crestor, but leg pains on generic.  Simvastatin 20 mg - Prescribed 2010 (leg and arm pain bilaterally, persistent for >10 days. Resolved with stopping simvastatin) Not covered - Repatha (09/03/2021) Coverage denied - CT scan does not meet criteria for atherosclerotic cardiovascular disease (ASCVD)   Reported Diet: Reports mainly lean meats (chicken - limits red meats). Lives with daughter who eats  Exercise: Walks  outside quite a bit    Tobacco Use:  Current tobacco use: 1 PDD Start Smoking: >10 years ago Current Medications: N/A Medication Tried Previously:  Chantix ("made her feel sick and did not work") Bupropion (side effect not documented) Nicotine gum (did not like the taste)   Cardiovascular Risk Reduction History of clinical ASCVD? no The 10-year ASCVD risk score (Arnett DK, et al., 2019) is: 8% CAC: Yes. 47.5 (08/28/21) History of heart failure? no  History of diabetes: Prediabetes Current BMI: 24.1 kg/m2 (Ht 67 in, Wt 70 kg) Tobacco: Current use (1 ppd) Taking statin? yes; moderate intensity (rosuvastatin 5 mg) Taking aspirin?  Taking for priary prevention w hx of  ; Taking   Taking SGLT-2i? no Taking GLP- 1 RA? no   Family History: Father with Hx heart attack    _______________________________________________  Objective    Review of Systems:? Limited in the setting of virtual visit  Constitutional:? No fever, chills or unintentional weight loss  Cardiovascular:? No chest pain or pressure, shortness of breath, dyspnea on exertion, orthopnea or LE edema  Pulmonary:? No cough or shortness of breath    Physical Examination:  Vitals:  Wt Readings from Last 3 Encounters:  12/29/23 154 lb 6 oz (70 kg)  11/19/23 157 lb 8 oz (71.4 kg)  10/27/23 159 lb (72.1 kg)   BP Readings from Last 3 Encounters:  12/29/23 108/68  11/19/23 116/70  10/27/23 124/68   Pulse Readings from Last 3 Encounters:  12/29/23 67  11/19/23 76  10/27/23 69   Labs:?    Lab Results  Component Value Date   CHOL 179 12/22/2023  LDLCALC 108 (H) 12/22/2023   LDLCALC 115 (H) 10/27/2023   LDLCALC 94 09/18/2022   LDLDIRECT 150.0 08/05/2017   LDLDIRECT 217.5 12/11/2010   HDL 52.40 12/22/2023   TRIG 94.0 12/22/2023   TRIG 109.0 10/27/2023   TRIG 63.0 09/18/2022   ALT 16 12/22/2023   ALT 17 10/27/2023   AST 12 12/22/2023   AST 12 10/27/2023   Lab Results  Component Value Date   HGBA1C 5.8  10/27/2023   HGBA1C 5.8 09/18/2022   HGBA1C 5.5 09/06/2021   GLUCOSE 100 (H) 12/22/2023   CREATININE 0.83 12/22/2023   CREATININE 0.77 10/27/2023   CREATININE 0.59 09/18/2022   GFR 75.19 12/22/2023   GFR 82.37 10/27/2023   GFR 96.98 09/18/2022     Chemistry      Component Value Date/Time   NA 141 12/22/2023 0747   K 4.3 12/22/2023 0747   CL 105 12/22/2023 0747   CO2 27 12/22/2023 0747   BUN 15 12/22/2023 0747   CREATININE 0.83 12/22/2023 0747   CREATININE 0.67 10/07/2019 1554      Component Value Date/Time   CALCIUM 9.3 12/22/2023 0747   ALKPHOS 94 12/22/2023 0747   AST 12 12/22/2023 0747   ALT 16 12/22/2023 0747   BILITOT 0.6 12/22/2023 0747      The 10-year ASCVD risk score (Arnett DK, et al., 2019) is: 8%  Assessment and Plan:    Hyperlipidemia (primary prevention):  Uncontrolled  on last lipid panel (12/22/23) which is reflective of taking rosuvastatin 5 mg daily: TC 179 mg/dL, LDL 161 mg/dL, HDL 52 mg/dL, TG 64 mg/dL. LDL goal <70 mg/dL (primary prevention, diabetes). Per 2024 ACC/AHA consensus statement, pt indicated for ezetimibe therapy with moderately elevated CAC. Reasonable option given PSCK9i not covered.  - Current Regimen: rosuvastatin 5 mg daily - Previous therapies: Simvastatin, rosuvastatin 10 mg - Key risk factors include: hypertension (well-controlled), hyperlipidemia, current smoker (1PPD), and elevated CAC score 2022 , Hx prediabetes - The 10-year ASCVD risk score (Arnett DK, et al., 2019) is: 8% - PREVENT: 10-Yr CAD 10.7% PA Repatha: CMM Key [id number] - Denied (baseline LDL must be >190 mg/dL for approval. Start ezetimibe 10 mg daily  Continue rosuvastatin 5 mg daily  Consider repeat lipid panel 4-12 weeks or at next office visit       Future Consideration: PCSK9i: 15% reduction in MACE outcomes per FOURIER/ODYSSEY trials. Not covered by insurance. Consider new request if unable to achieve LDL goal s/p 4-12 weeks after ezetimibe start  Smoking  Cessation: Patient reports desire to quit smoking though struggles with the mental component of committing. Documented trials of bupropion and Chantix in the past with unspecified side effects/ felt were ineffective. Patient has no recollection of either. Denies hx nicotine patch though recallsl disliking the taste of nicotine gum.  - Readiness to Quit: Open, expresses desire to quit. Hesitancy regarding her ability to do it.  - Established Goals: Continue to reduce quantity of cigarettes per day. Has cut back from previous.  Consider NRT with nicotine patch / lozenge if covered by insurance  Nicotine Patch (step 1) >10 cigarettes/day: Begin with step 1 (21 mg/day) for 6 weeks, followed by step 2 (14 mg/day) for 2 weeks; finish with step 3 (7 mg/day) for 2 weeks. Nicotine Lozenge (4 mg) 1 lozenge q1-2 hours as needed x 6 weeks then re-evaluate reduction to 2 mg lozenge    Follow Up Follow up with clinical pharmacist via phone in 4 weeks to assess tolerability / tobacco cessation  progress  Future Appointments  Date Time Provider Department Center  02/26/2024  8:45 AM [facility]    Loree Fee, PharmD Clinical Pharmacist [facility] [phone]

## 2024-01-08 MED ORDER — EZETIMIBE 10 MG PO TABS
10.0000 mg | ORAL_TABLET | Freq: Every day | ORAL | 3 refills | Status: DC
Start: 2024-01-08 — End: 2024-01-27

## 2024-01-08 MED ORDER — NICOTINE 21 MG/24HR TD PT24: 21.0000 mg | MEDICATED_PATCH | Freq: Every day | TRANSDERMAL | 1 refills | Status: DC

## 2024-01-08 MED ORDER — NICOTINE POLACRILEX 4 MG MT LOZG: LOZENGE | OROMUCOSAL | 1 refills | Status: DC

## 2024-01-14 ENCOUNTER — Other Ambulatory Visit: Payer: Self-pay | Admitting: Internal Medicine

## 2024-01-27 ENCOUNTER — Ambulatory Visit (INDEPENDENT_AMBULATORY_CARE_PROVIDER_SITE_OTHER): Admitting: Family Medicine

## 2024-01-27 ENCOUNTER — Encounter: Payer: Self-pay | Admitting: Family Medicine

## 2024-01-27 VITALS — BP 126/82 | HR 43 | Temp 97.9°F | Ht 67.0 in | Wt 154.0 lb

## 2024-01-27 DIAGNOSIS — E7800 Pure hypercholesterolemia, unspecified: Secondary | ICD-10-CM | POA: Diagnosis not present

## 2024-01-27 DIAGNOSIS — R413 Other amnesia: Secondary | ICD-10-CM | POA: Diagnosis not present

## 2024-01-27 DIAGNOSIS — F4321 Adjustment disorder with depressed mood: Secondary | ICD-10-CM

## 2024-01-27 DIAGNOSIS — F32A Depression, unspecified: Secondary | ICD-10-CM

## 2024-01-27 DIAGNOSIS — F419 Anxiety disorder, unspecified: Secondary | ICD-10-CM

## 2024-01-27 DIAGNOSIS — F17200 Nicotine dependence, unspecified, uncomplicated: Secondary | ICD-10-CM | POA: Diagnosis not present

## 2024-01-27 DIAGNOSIS — Z87442 Personal history of urinary calculi: Secondary | ICD-10-CM | POA: Diagnosis not present

## 2024-01-27 MED ORDER — EZETIMIBE 10 MG PO TABS: 10.0000 mg | ORAL_TABLET | Freq: Every day | ORAL | 3 refills | Status: AC

## 2024-01-27 NOTE — Progress Notes (Signed)
 Subjective:    Patient ID: Carolyn Cordova, female    DOB: 12-Apr-1961, 63 y.o.   MRN: [id number]  HPI  Wt Readings from Last 3 Encounters:  01/27/24 154 lb (69.9 kg)  12/29/23 154 lb 6 oz (70 kg)  11/19/23 157 lb 8 oz (71.4 kg)   24.12 kg/m  Vitals:   01/27/24 0924  BP: 126/82  Pulse: (!) 43  Temp: 97.9 F (36.6 C)  SpO2: 99%    Here for visit to discuss memory  Also refill of zetia  Takes flomax for renal stones -needs refill   Daughter is on cell phone with [place] today and adds to history     Takes fluoxetine 20 mg daily   Memory changes -daughter is concerned about this / getting worse  Pt thinks it is due to not working anymore   Noted some improvement with treatment of B12 def  Also treatment of mood /dep   Daugher on phone today  Good and bad days but does not think B12 is helping much   Forgets where she is going to do things  Worse if she gets anxious    Pt is unsure how often she is anxious  Several days per week - nervous or worried / occational gets fixated on something (like finances)  Makes it hard to concentrate at times   Has left the stove on -once   No confusion  No wandering   Drinks too much coffee- trying to transition to decaf  Grief is about the same  Not in therapy- wants to check in to     MMSE was in 11/2023 and scored 28.5 out of 30       12/29/2023    9:39 AM 11/19/2023   10:39 AM 10/31/2022   11:05 AM 09/25/2022    9:17 AM 03/13/2021    8:58 AM  Depression screen PHQ 2/9  Decreased Interest 0 0 3 1 1   Down, Depressed, Hopeless 0 0 3 1 1   PHQ - 2 Score 0 0 6 2 2   Altered sleeping 0 0 3 1 1   Tired, decreased energy 1 1 3 1 1   Change in appetite 0 0 0 0 1  Feeling bad or failure about yourself  0 0 3 0 1  Trouble concentrating 0 0 3 1 1   Moving slowly or fidgety/restless 0 0 0 0 0  Suicidal thoughts 0 0 0 0 0  PHQ-9 Score 1 1 18 5 7   Difficult doing work/chores Not difficult at all Not difficult at all Very  difficult        12/29/2023    9:39 AM 11/19/2023   10:39 AM 10/31/2022   11:07 AM  GAD 7 : Generalized Anxiety Score  Nervous, Anxious, on Edge 1 1 3   Control/stop worrying 1 1 3   Worry too much - different things 1 1 3   Trouble relaxing 0 1 3  Restless 0 0 3  Easily annoyed or irritable 0 0 0  Afraid - awful might happen 0 0 0  Total GAD 7 Score 3 4 15   Anxiety Difficulty Somewhat difficult Not difficult at all Very difficult        Lab Results  Component Value Date   VITAMINB12 742 12/22/2023    Hyperlipidemia Lab Results  Component Value Date   CHOL 179 12/22/2023   HDL 52.40 12/22/2023   LDLCALC 108 (H) 12/22/2023   LDLDIRECT 150.0 08/05/2017   TRIG 94.0 12/22/2023   CHOLHDL  3 12/22/2023   Zetia 10 mg  Crestor 5 mg daily   Still smoking   Sees Dr Dannette Barbara for urology  On flomax for history of renal stones   Patient Active Problem List   Diagnosis Date Noted   History of kidney stones 01/27/2024   B12 deficiency 10/28/2023   Memory changes 10/27/2023   Anxiety and depression 10/31/2022   Encounter for screening mammogram for breast cancer 09/25/2022   Grief 09/25/2022   Prediabetes 09/06/2021   Colon cancer screening 04/10/2015   Internal hemorrhoid 04/10/2015   Encounter for routine gynecological examination 03/17/2014   Routine general medical examination at a health care facility 03/09/2014   H/O herpes zoster 08/09/2012   Dry mouth 04/20/2012   Elevated rheumatoid factor 04/20/2012   HERNIATED CERVICAL DISC 09/14/2009   Pure hypercholesterolemia 05/10/2009   TOBACCO ABUSE 08/19/2007   CARPAL TUNNEL SYNDROME, BILATERAL 08/19/2007   Essential hypertension 08/19/2007   HEMORRHOIDS 08/19/2007   History of cervical dysplasia 08/19/2007   MIGRAINES, HX OF 08/19/2007   Past Medical History:  Diagnosis Date   Allergy    seasonal   Carpal tunnel syndrome    Cervical disc disease    GERD (gastroesophageal reflux disease)    Hyperlipidemia     Hypertension    Migraine    PMDD (premenstrual dysphoric disorder)    Past Surgical History:  Procedure Laterality Date   BREAST BIOPSY Left    unknown when, X-clip, benign   CESAREAN SECTION     COLONOSCOPY     CRYOABLATION  09/1989   secondary to dysplasia   EXTRACORPOREAL SHOCK WAVE LITHOTRIPSY Right 10/02/2022   Procedure: EXTRACORPOREAL SHOCK WAVE LITHOTRIPSY (ESWL);  Surgeon: Riki Altes, MD;  Location: [facility];  Service: Urology;  Laterality: Right;   EXTRACORPOREAL SHOCK WAVE LITHOTRIPSY Right 10/16/2022   Procedure: EXTRACORPOREAL SHOCK WAVE LITHOTRIPSY (ESWL);  Surgeon: Vanna Scotland, MD;  Location: [facility];  Service: Urology;  Laterality: Right;   lipoma removal  2006   PILONIDAL CYST EXCISION  1980s   x2   plantar fasciitis  06/2011   surgery with Dr Al Corpus   POLYPECTOMY     SPINE SURGERY  01/2010   cervical spine surgery   Social History   Tobacco Use   Smoking status: Every Day    Current packs/day: 1.00    Types: Cigarettes   Smokeless tobacco: Never  Vaping Use   Vaping status: Never Used  Substance Use Topics   Alcohol use: No    Alcohol/week: 0.0 standard drinks of alcohol   Drug use: No   Family History  Problem Relation Age of Onset   Hyperlipidemia Mother    Cancer Mother        breast CA   Breast cancer Mother    Cancer Father        adrenal cancer   Colon polyps Father    Hyperlipidemia Father    Breast cancer Maternal Aunt    Breast cancer Paternal Aunt    Breast cancer Maternal Grandmother    Cancer Cousin        breast CA   Hyperlipidemia Brother    Colon cancer Neg Hx    Esophageal cancer Neg Hx    Rectal cancer Neg Hx    Stomach cancer Neg Hx    Allergies  Allergen Reactions   Adhesive [Tape]    Amoxicillin-Pot Clavulanate     REACTION: rash   Codeine     REACTION: ? reaction   Fexofenadine  REACTION: nausea   Sertraline Hcl     REACTION: sedation   Varenicline Tartrate     REACTION: made her feel sick  and did not work   Wellbutrin [Bupropion] Other (See Comments)   Current Outpatient Medications on File Prior to Visit  Medication Sig Dispense Refill   amLODipine (NORVASC) 5 MG tablet Take 1 tablet (5 mg total) by mouth daily. 90 tablet 3   aspirin 81 MG tablet Take 81 mg by mouth daily.     cetirizine (ZYRTEC) 10 MG tablet Take 10 mg by mouth daily.     cevimeline (EVOXAC) 30 MG capsule TAKE 1 CAPSULE BY MOUTH 3 TIMES A DAY 270 capsule 3   cyanocobalamin (VITAMIN B12) 1000 MCG tablet Take 1,000 mcg by mouth daily.     FLUoxetine (PROZAC) 20 MG capsule Take 1 capsule (20 mg total) by mouth daily. 90 capsule 3   rosuvastatin (CRESTOR) 5 MG tablet TAKE 1 TABLET (5 MG TOTAL) BY MOUTH DAILY. 15 tablet 0   tamsulosin (FLOMAX) 0.4 MG CAPS capsule TAKE 1 CAPSULE BY MOUTH EVERY DAY 90 capsule 3   No current facility-administered medications on file prior to visit.    Review of Systems  Constitutional:  Positive for fatigue.  Cardiovascular:  Negative for chest pain, palpitations and leg swelling.  Genitourinary:  Negative for dysuria, flank pain, hematuria and pelvic pain.  Psychiatric/Behavioral:  Positive for decreased concentration and dysphoric mood. Negative for behavioral problems and confusion. The patient is nervous/anxious.        Objective:   Physical Exam Constitutional:      General: She is not in acute distress.    Appearance: Normal appearance. She is normal weight. She is not ill-appearing or diaphoretic.  Eyes:     Conjunctiva/sclera: Conjunctivae normal.     Pupils: Pupils are equal, round, and reactive to light.  Neurological:     Mental Status: She is alert.     Cranial Nerves: No cranial nerve deficit.     Motor: No weakness.     Coordination: Coordination normal.     Gait: Gait normal.  Psychiatric:        Attention and Perception: Attention normal.        Mood and Affect: Mood normal. Affect is not blunt or tearful.        Speech: Speech normal.         Behavior: Behavior normal.        Thought Content: Thought content normal.     Comments: Candidly discusses symptoms and stressors   Mildly anxious   Does not repeat herself  Responds appropriately            Assessment & Plan:   Problem List Items Addressed This Visit       Other   TOBACCO ABUSE   Disc in detail risks of smoking and possible outcomes including copd, vascular/ heart disease, cancer , respiratory and sinus infections as well as osteoporosis  Pt voices understanding  Pt is not ready to quit       Pure hypercholesterolemia   Refilled zetia today  Also taking low dose crestor 5 mg daily  Under card care Disc goals for lipids and reasons to control them Rev last labs with pt Rev low sat fat diet in detail       Relevant Medications   ezetimibe (ZETIA) 10 MG tablet   Memory changes - Primary   Continues to struggle with short term memory  Also concentration  In setting of anxiety and grief  Per family -not improved much after B12 replacement   Did ref for mental health therapy   Is a smoker with HTN  Ct head ordered to look for old stroke or other changes   Also ref to neuro for further eval       Relevant Orders   CT HEAD WO CONTRAST ( )   Ambulatory referral to Neurology   History of kidney stones   Reviewed urology note from fall Unclear if flomax is daily or only prn  She will call to clarify that   If doing well and not seeing urology on schedule/ we can refill if needed   She will let us know       Grief   Referred for mental health talk therapy   Continues fluoxetine 20 mg        Relevant Orders   Ambulatory referral to Psychology   Anxiety and depression   Still dealing with some grief At least 2 anxious days per week where she can get hyper fixated on a worry  Dealing with loss of job and down time Reviewed stressors/ coping techniques/symptoms/ support sources/ tx options and side effects in detail today Continues  fluoxetine 20 mg daily has helped Ref for mental health counseling today -she is open to that   ? How much impact on short term memory and cognition       Relevant Orders   Ambulatory referral to Psychology

## 2024-01-27 NOTE — Assessment & Plan Note (Signed)
 Reviewed urology note from fall Unclear if flomax is daily or only prn  She will call to clarify that   If doing well and not seeing urology on schedule/ we can refill if needed   She will let us know

## 2024-01-27 NOTE — Assessment & Plan Note (Signed)
 Still dealing with some grief At least 2 anxious days per week where she can get hyper fixated on a worry  Dealing with loss of job and down time Reviewed stressors/ coping techniques/symptoms/ support sources/ tx options and side effects in detail today Continues fluoxetine 20 mg daily has helped Ref for mental health counseling today -she is open to that   ? How much impact on short term memory and cognition

## 2024-01-27 NOTE — Assessment & Plan Note (Signed)
 Refilled zetia today  Also taking low dose crestor 5 mg daily  Under card care Disc goals for lipids and reasons to control them Rev last labs with pt Rev low sat fat diet in detail

## 2024-01-27 NOTE — Patient Instructions (Addendum)
 I will work on referrals for mental health talk therapy, CT scan , and neurologist   I put the referrals in  Please let us know if you don't hear in 1-2 weeks    Call your urology office and ask about the status of the tamsulosin prescription - ? Are you supposed to take it all the time or just when actively passing a stone  Let me know if we need to take over that prescription   Take care of yourself  Stay active  Stay social

## 2024-01-27 NOTE — Assessment & Plan Note (Signed)
 Disc in detail risks of smoking and possible outcomes including copd, vascular/ heart disease, cancer , respiratory and sinus infections as well as osteoporosis  Pt voices understanding  Pt is not ready to quit

## 2024-01-27 NOTE — Assessment & Plan Note (Signed)
 Continues to struggle with short term memory  Also concentration  In setting of anxiety and grief  Per family -not improved much after B12 replacement   Did ref for mental health therapy   Is a smoker with HTN  Ct head ordered to look for old stroke or other changes   Also ref to neuro for further eval

## 2024-01-27 NOTE — Assessment & Plan Note (Signed)
 Referred for mental health talk therapy   Continues fluoxetine 20 mg

## 2024-01-29 ENCOUNTER — Encounter: Payer: Self-pay | Admitting: *Deleted

## 2024-02-03 ENCOUNTER — Telehealth (HOSPITAL_BASED_OUTPATIENT_CLINIC_OR_DEPARTMENT_OTHER): Payer: Self-pay | Admitting: Internal Medicine

## 2024-02-03 ENCOUNTER — Other Ambulatory Visit: Payer: Self-pay | Admitting: Internal Medicine

## 2024-02-03 MED ORDER — ROSUVASTATIN CALCIUM 5 MG PO TABS: 5.0000 mg | ORAL_TABLET | Freq: Every day | ORAL | 1 refills | Status: AC

## 2024-02-03 NOTE — Telephone Encounter (Signed)
 Refill sent to pharmacy until upcoming visit with Dr. Rennis Golden.

## 2024-02-03 NOTE — Telephone Encounter (Signed)
*  STAT* If patient is at the pharmacy, call can be transferred to refill team.   1. Which medications need to be refilled? (please list name of each medication and dose if known) rosuvastatin (CRESTOR) 5 MG tablet    2. Would you like to learn more about the convenience, safety, & potential cost savings by using the [facility]? N/A    3. Are you open to using the [facility] (Type [facility]. N/A   4. Which pharmacy/location (including street and city if local pharmacy) is medication to be sent to?  [id number] - [place], [place] - [address]    5. Do they need a 30 day or 90 day supply? Needs enough medication to last until 05/06 appt. Patient is completely out of medication.

## 2024-02-04 ENCOUNTER — Other Ambulatory Visit (INDEPENDENT_AMBULATORY_CARE_PROVIDER_SITE_OTHER): Payer: 59 | Admitting: Pharmacist

## 2024-02-04 DIAGNOSIS — F17200 Nicotine dependence, unspecified, uncomplicated: Secondary | ICD-10-CM

## 2024-02-04 MED ORDER — NICOTINE POLACRILEX 4 MG MT LOZG: LOZENGE | OROMUCOSAL | 1 refills | Status: DC

## 2024-02-04 NOTE — Progress Notes (Unsigned)
 02/04/2024 Name: Carolyn Cordova MRN: [id number] DOB: November 21, 1960  Subjective  No chief complaint on file.   Reason for visit: ?  Carolyn Cordova is a 63 y.o. female who presents today for a follow up pharmacotherapy visit related to hypercholesterolemia and Cardiorenal risk reduction.? Pertinent PMH includes HTN, HLD, tobacco use, pre-diabetes.  Care Team: Primary Care Provider: [name], MD  Date of Last Visit: with PCP on 12/29/23   Recent Summary of Change: Hyperlipidemia in smoker with positive cardiac ca score. On crestor 5 mg (only statin/only dose tolerated) Would like to consider pcsk9i but has been unaffordable).    Medication Access/Adherence: Prescription drug coverage: Payor: AETNA / Plan: AETNA [facility]  / Product Type: *No Product type* / .  Reports that all medications are affordable. Not sure if she has a deductible  Current Patient Assistance: None Medication Adherence: Patient denies missing doses of their medication.     History of Present Illness: ?  Tolerating 5 mg rosuvastatin well at this time though has been unable to tolerate higher doses due to bilateral persistent leg pain, only resolved with stopping or reducing dose of rosuvastatin.  Since last visit, confirms starting ezetimibe 10 mg daily without side effects or other concerns.  Reports NRT products are in the process of being filled at her pharmacy so she [place] still unsure how they are covered through her pharmacy insurance.  Reported Lipid Regimen: ?  Rosuvastatin 5 mg daily Ezetimibe 10 mg daily   Lipid-lowering medications tried in the past:?  Atorvastatin 20 mg - Prescribed 2016. D/c 2018 due to leg pain Rosuvastatin 10 mg (from 2012 to 2016. Stopped 2016 due to insurance change). Reports tolerating brand Crestor, but leg pains on generic.  Simvastatin 20 mg - Prescribed 2010 (leg and arm pain bilaterally, persistent for >10 days. Resolved with stopping simvastatin) Not covered -  Repatha (09/03/2021) Coverage denied - CT scan does not meet criteria for atherosclerotic cardiovascular disease (ASCVD)   Reported Diet: Reports mainly lean meats (chicken - limits red meats). Lives with daughter who eats  Exercise: Walks outside quite a bit    Tobacco Use:  Current tobacco use: 1 PDD Start Smoking: >10 years ago Current Medications: N/A Medication Tried Previously:  Chantix ("made her feel sick and did not work") Bupropion (side effect not documented, patient cannot recall) Nicotine gum (did not like the taste)   Cardiovascular Risk Reduction History of clinical ASCVD? no The 10-year ASCVD risk score (Arnett DK, et al., 2019) is: 10.7% CAC: 47.5 (08/28/21) History of heart failure? no  History of diabetes: Prediabetes Current BMI: 24.1 kg/m2 (Ht 67 in, Wt 70 kg) Tobacco: Current use (1 ppd) Taking statin? yes; moderate intensity (rosuvastatin 5 mg) Taking aspirin?  Taking for priary prevention w hx of  ; Taking   Taking SGLT-2i? no Taking GLP- 1 RA? no   Family History: Father Hx heart attack    _______________________________________________  Objective    Review of Systems:? Limited in the setting of virtual visit  Constitutional:? No fever, chills or unintentional weight loss  Cardiovascular:? No chest pain or pressure, shortness of breath, dyspnea on exertion, orthopnea or LE edema  Pulmonary:? No cough or shortness of breath    Physical Examination:  Vitals:  Wt Readings from Last 3 Encounters:  01/27/24 154 lb (69.9 kg)  12/29/23 154 lb 6 oz (70 kg)  11/19/23 157 lb 8 oz (71.4 kg)   BP Readings from Last 3 Encounters:  01/27/24 126/82  12/29/23 108/68  11/19/23 116/70   Pulse Readings from Last 3 Encounters:  01/27/24 (!) 43  12/29/23 67  11/19/23 76   Labs:?    Lab Results  Component Value Date   CHOL 179 12/22/2023   LDLCALC 108 (H) 12/22/2023   LDLCALC 115 (H) 10/27/2023   LDLCALC 94 09/18/2022   LDLDIRECT 150.0 08/05/2017    LDLDIRECT 217.5 12/11/2010   HDL 52.40 12/22/2023   TRIG 94.0 12/22/2023   TRIG 109.0 10/27/2023   TRIG 63.0 09/18/2022   ALT 16 12/22/2023   ALT 17 10/27/2023   AST 12 12/22/2023   AST 12 10/27/2023   Lab Results  Component Value Date   HGBA1C 5.8 10/27/2023   HGBA1C 5.8 09/18/2022   HGBA1C 5.5 09/06/2021   GLUCOSE 100 (H) 12/22/2023   CREATININE 0.83 12/22/2023   CREATININE 0.77 10/27/2023   CREATININE 0.59 09/18/2022   GFR 75.19 12/22/2023   GFR 82.37 10/27/2023   GFR 96.98 09/18/2022     Chemistry      Component Value Date/Time   NA 141 12/22/2023 0747   K 4.3 12/22/2023 0747   CL 105 12/22/2023 0747   CO2 27 12/22/2023 0747   BUN 15 12/22/2023 0747   CREATININE 0.83 12/22/2023 0747   CREATININE 0.67 10/07/2019 1554      Component Value Date/Time   CALCIUM 9.3 12/22/2023 0747   ALKPHOS 94 12/22/2023 0747   AST 12 12/22/2023 0747   ALT 16 12/22/2023 0747   BILITOT 0.6 12/22/2023 0747      The 10-year ASCVD risk score (Arnett DK, et al., 2019) is: 10.7%  Assessment and Plan:   Hyperlipidemia (primary prevention):  Uncontrolled  on last lipid panel (12/22/23) which is reflective of taking rosuvastatin 5 mg daily: TC 179 mg/dL, LDL 621 mg/dL, HDL 52 mg/dL, TG 64 mg/dL. LDL goal <70 mg/dL (primary prevention, diabetes). Per 2024 ACC/AHA consensus statement, pt indicated for ezetimibe therapy with moderately elevated CAC. Reasonable option given PSCK9i not covered.  - Current Regimen: rosuvastatin 5 mg daily - Previous therapies: Simvastatin, rosuvastatin 10 mg - Key risk factors include: hypertension (well-controlled), hyperlipidemia, current smoker (1PPD), and elevated CAC score 2022 , Hx prediabetes - The 10-year ASCVD risk score (Arnett DK, et al., 2019) is: 10.7% - PREVENT: 10-Yr CAD 10.7% PA Repatha: CMM Key [id number] - Denied (baseline LDL must be >190 mg/dL for approval. Start ezetimibe 10 mg daily  Continue rosuvastatin 5 mg daily  Consider repeat lipid  panel 4-12 weeks or at next office visit       Future Consideration: PCSK9i: 15% reduction in MACE outcomes per FOURIER/ODYSSEY trials. Not covered by insurance. Consider new request if unable to achieve LDL goal s/p 4-12 weeks after ezetimibe start  Smoking Cessation: Patient reports desire to quit smoking though struggles with the mental component of committing. Documented trials of bupropion and Chantix in the past with unspecified side effects/ felt were ineffective. Patient has no recollection of either. Denies hx nicotine patch though recallsl disliking the taste of nicotine gum.  - Readiness to Quit: Open, expresses desire to quit. Hesitancy regarding her ability to do it.  - Established Goals: Continue to reduce quantity of cigarettes per day. Has cut back from previous.  Consider NRT with nicotine patch / lozenge if covered by insurance  Nicotine Patch (step 1) >10 cigarettes/day: Begin with step 1 (21 mg/day) for 6 weeks, followed by step 2 (14 mg/day) for 2 weeks; finish with step 3 (7 mg/day)  for 2 weeks. Nicotine Lozenge (4 mg) 1 lozenge q1-2 hours as needed x 6 weeks then re-evaluate reduction to 2 mg lozenge    Follow Up Follow up with clinical pharmacist via phone in 4 weeks to assess tolerability / tobacco cessation progress  Future Appointments  Date Time Provider Department Center  02/04/2024 10:00 AM [facility] PHARMACIST [facility]  02/23/2024  8:30 AM [facility] 2 [facility] [address] [facility]  02/24/2024  2:30 PM Doree Barthel, LCSW [facility] None  02/26/2024  8:45 AM [facility] LAB [facility]  03/22/2024  1:15 PM Hilty, Lisette Abu, MD [facility]  04/12/2024  1:15 PM Windell Norfolk, MD [facility] None    Loree Fee, PharmD Clinical Pharmacist [facility] [phone]

## 2024-02-23 ENCOUNTER — Ambulatory Visit
Admission: RE | Admit: 2024-02-23 | Discharge: 2024-02-23 | Disposition: A | Discharge status: Home / Self Care | Source: Ambulatory Visit | Attending: Family Medicine | Admitting: Family Medicine

## 2024-02-23 DIAGNOSIS — R4182 Altered mental status, unspecified: Secondary | ICD-10-CM | POA: Diagnosis not present

## 2024-02-23 DIAGNOSIS — R413 Other amnesia: Secondary | ICD-10-CM

## 2024-02-24 ENCOUNTER — Ambulatory Visit (INDEPENDENT_AMBULATORY_CARE_PROVIDER_SITE_OTHER): Admitting: Clinical

## 2024-02-24 ENCOUNTER — Encounter: Payer: Self-pay | Admitting: Family Medicine

## 2024-02-24 ENCOUNTER — Other Ambulatory Visit (INDEPENDENT_AMBULATORY_CARE_PROVIDER_SITE_OTHER)

## 2024-02-24 ENCOUNTER — Telehealth: Payer: Self-pay | Admitting: Family Medicine

## 2024-02-24 DIAGNOSIS — F419 Anxiety disorder, unspecified: Secondary | ICD-10-CM

## 2024-02-24 DIAGNOSIS — E538 Deficiency of other specified B group vitamins: Secondary | ICD-10-CM | POA: Diagnosis not present

## 2024-02-24 NOTE — Telephone Encounter (Signed)
-----   Message from Alvina Chou sent at 02/05/2024  2:36 PM EDT ----- Regarding: Lab orders for Fri, 4.10.25 Lab orders, thanks

## 2024-02-24 NOTE — Progress Notes (Signed)
   Doree Barthel, LCSW

## 2024-02-24 NOTE — Progress Notes (Addendum)
 63 Behavioral Health Counselor Initial Adult Exam  Name: Carolyn Cordova Date: 02/24/2024 MRN: [id number] DOB: 01/01/1964 PCP: [name], MD  Time spent: 2:35pm - 3:41pm   Guardian/Payee:  NA    Paperwork requested:  NA  Reason for Visit /Presenting Problem: Patient stated, "my daughter's concern about my memory and I'm concerned about my memory". Patient reported patient's husband died September 13, 2022 due to illness and she lost her job in 2023, "there are changes in my personality I believe", "I forget things".   Mental Status Exam: Appearance:   Neat and Well Groomed     Behavior:  Appropriate  Motor:  Normal  Speech/Language:   Clear and Coherent and Normal Rate  Affect:  Appropriate  Mood:  anxious  Thought process:  normal  Thought content:    WNL  Sensory/Perceptual disturbances:    WNL  Orientation:  oriented to person, place, situation, day of week, month of year, and year  Attention:  Good  Concentration:  Good  Memory:  WNL during today's session  Fund of knowledge:   Good  Insight:    Good  Judgment:   Good  Impulse Control:  Good    Reported Symptoms:  Patient reported recent change in personality (not around as many people since the loss of patient's job, withdrawn, stated "I second guess things"), changes in memory and stated, "ill forget things", short term memory loss, anxious at times, stated "I'm pretty content" in reference to mood, decreased energy, stated "my attention span will float away every once in awhile", loses train of thought when interrupted. Patient reported memory changes started prior to patient losing her job. [name] reported changes in patient's memory has affected patient's daily life. [name] reported "some days are better than others", some days patient will fixate on tasks/objects, forgets where patient left items, is frustrated when others try to assist patient, forgets what patient is doing and has to be  reminded of the task patient is performing, short term memory loss, and confusion at times. Daughter reported changes occurred approximately 1 year ago. [name] reported patient doesn't sleep well. Patient stated, "I over think" and reported feeling anxious at times.   Risk Assessment: Danger to Self:  No Patient denied current and past suicidal ideation, homicidal ideation, and symptoms of psychosis Self-injurious Behavior: No Danger to Others: No Duty to Warn:no Physical Aggression / Violence:No  Access to Firearms a concern: No but reported access Gang Involvement:No  Patient / guardian was educated about steps to take if suicide or homicide risk level increases between visits: yes While future psychiatric events cannot be accurately predicted, the patient does not currently require acute inpatient psychiatric care and does not currently meet [facility] involuntary commitment criteria.  Substance Abuse History: Current substance abuse: No . Patient reported she currently smokes cigarettes, 1 pack per day, daily use, with last use today. Patient reported no current alcohol use. Patient reported no current or past drug use.   Past Psychiatric History:   No previous psychological problems have been observed Outpatient Providers: none History of Psych Hospitalization: No  Psychological Testing:  none    Abuse History:  Victim of: No.,  none    Report needed: No. Victim of Neglect:No. Perpetrator of  none   Witness / Exposure to Domestic Violence: No   Protective Services Involvement: No  Witness to [employer] Violence:  No   Family History:  Family History  Problem Relation Age of Onset  Hyperlipidemia Mother    Cancer Mother        breast CA   Breast cancer Mother    Cancer Father        adrenal cancer   Colon polyps Father    Hyperlipidemia Father    Breast cancer Maternal Aunt    Breast cancer Paternal Aunt    Breast cancer Maternal Grandmother    Cancer  Cousin        breast CA   Hyperlipidemia Brother    Colon cancer Neg Hx    Esophageal cancer Neg Hx    Rectal cancer Neg Hx    Stomach cancer Neg Hx     Living situation: the patient lives with their family (with daughter and daughter's boyfriend)  Sexual Orientation: Straight  Relationship Status: widowed  Name of spouse / other: Carolyn Cordova If a parent, number of children / ages: 3 children (ages 21, 86, and reported stepdaughter is in her 45's  Support Systems: daughters, brother, neighbors  Surveyor, quantity Stress:  No   Income/Employment/Disability: Social Immunologist: No   Educational History: Education: high school diploma/GED  Religion/Sprituality/World View: [employer] of christ  Any cultural differences that may affect / interfere with treatment:  not applicable   Recreation/Hobbies: being outside, walking around the pond, fishing, gardening  Stressors: Other: none reported    Strengths: Supportive Relationships, Family, and Spirituality  Barriers:  none   Legal History: Pending legal issue / charges: The patient has no significant history of legal issues. History of legal issue / charges:  none  Medical History/Surgical History: reviewed Past Medical History:  Diagnosis Date   Allergy    seasonal   Carpal tunnel syndrome    Cervical disc disease    GERD (gastroesophageal reflux disease)    Hyperlipidemia    Hypertension    Migraine    PMDD (premenstrual dysphoric disorder)     Past Surgical History:  Procedure Laterality Date   BREAST BIOPSY Left    unknown when, X-clip, benign   CESAREAN SECTION     COLONOSCOPY     CRYOABLATION  09/1989   secondary to dysplasia   EXTRACORPOREAL SHOCK WAVE LITHOTRIPSY Right 10/02/2022   Procedure: EXTRACORPOREAL SHOCK WAVE LITHOTRIPSY (ESWL);  Surgeon: Riki Altes, MD;  Location: [facility] ORS;  Service: Urology;  Laterality: Right;   EXTRACORPOREAL SHOCK WAVE  LITHOTRIPSY Right 10/16/2022   Procedure: EXTRACORPOREAL SHOCK WAVE LITHOTRIPSY (ESWL);  Surgeon: Vanna Scotland, MD;  Location: [facility] ORS;  Service: Urology;  Laterality: Right;   lipoma removal  2006   PILONIDAL CYST EXCISION  1980s   x2   plantar fasciitis  06/2011   surgery with Dr Al Corpus   POLYPECTOMY     SPINE SURGERY  01/2010   cervical spine surgery    Medications: Current Outpatient Medications  Medication Sig Dispense Refill   amLODipine (NORVASC) 5 MG tablet Take 1 tablet (5 mg total) by mouth daily. 90 tablet 3   aspirin 81 MG tablet Take 81 mg by mouth daily.     cetirizine (ZYRTEC) 10 MG tablet Take 10 mg by mouth daily.     cevimeline (EVOXAC) 30 MG capsule TAKE 1 CAPSULE BY MOUTH 3 TIMES A DAY 270 capsule 3   cyanocobalamin (VITAMIN B12) 1000 MCG tablet Take 1,000 mcg by mouth daily.     ezetimibe (ZETIA) 10 MG tablet Take 1 tablet (10 mg total) by mouth daily. 90 tablet 3   FLUoxetine (PROZAC) 20 MG capsule  Take 1 capsule (20 mg total) by mouth daily. 90 capsule 3   nicotine (NICODERM CQ - DOSED IN MG/24 HOURS) 21 mg/24hr patch 21 mg daily.     nicotine polacrilex (COMMIT) 4 MG lozenge Use as needed. Dissolve 1 lozenge po every 1-2 hours. MDD 20 lozenge 200 lozenge 1   rosuvastatin (CRESTOR) 5 MG tablet Take 1 tablet (5 mg total) by mouth daily. 30 tablet 1   tamsulosin (FLOMAX) 0.4 MG CAPS capsule TAKE 1 CAPSULE BY MOUTH EVERY DAY 90 capsule 3   No current facility-administered medications for this visit.    Allergies  Allergen Reactions   Adhesive [Tape]    Amoxicillin-Pot Clavulanate     REACTION: rash   Codeine     REACTION: ? reaction   Fexofenadine     REACTION: nausea   Sertraline Hcl     REACTION: sedation   Varenicline Tartrate     REACTION: made her feel sick and did not work   Wellbutrin [Bupropion] Other (See Comments)    Diagnoses:  Anxiety disorder, unspecified type  Plan of Care: Patient is 63 year old female who presented for an initial  assessment. Clinician conducted initial assessment in person from clinician's office at [facility]. Patient provided verbal consent for patient's daughter, Sterling Big, to provide collateral information at the end of today's session. Patient reported the following symptoms: recent change in personality, is withdrawn, changes in memory, forgetful,  short term memory loss, anxious at times, decreased energy, decreased concentration at times, loses train of thought when interrupted.  [name] reported patient has exhibited the following symptoms: will fixate on tasks/objects, forgets where patient left items, becomes frustrated when others try to assist patient, forgets the tasks patient is performing and has to be reminded of the task, short term memory loss, and confusion at times. [name] reported patient does not sleep well. Patient denied current and past suicidal ideation, homicidal ideation, and symptoms of psychosis. Patient reported daily tobacco use. Patient reported no current alcohol use. Patient reported no current or past drug use. Patient reported no history of inpatient or outpatient behavioral health treatment. Patient reported no current stressors. Patient identified patient's daughters, patient's brother, and neighbors as current supports. It is recommended patient participate in individual therapy biweekly. In addition, it is recommended patient may benefit from a referral to a psychiatrist for a psychiatric evaluation and a referral for a neuropsychiatric evaluation to assess for cognitive changes. Clinician will review recommendations and treatment plan with patient during follow up appointment. Treatment plan will be developed during follow up appointment.   Collaboration of Care: Primary Care Provider AEB Patient requested to complete a consent for patient's PCP, Dr. Roxy Manns   Patient/Guardian was advised Release of Information must be obtained prior to any  record release in order to collaborate their care with an outside provider. Patient/Guardian was advised if they have not already done so to contact [facility] to sign all necessary forms in order for [place] to release information regarding their care.    Doree Barthel, LCSW

## 2024-02-25 ENCOUNTER — Encounter: Payer: Self-pay | Admitting: Family Medicine

## 2024-02-25 LAB — VITAMIN B12: Vitamin B-12: 923 pg/mL — ABNORMAL HIGH (ref 211–911)

## 2024-02-26 ENCOUNTER — Other Ambulatory Visit: Payer: 59

## 2024-03-02 ENCOUNTER — Other Ambulatory Visit: Payer: Self-pay | Admitting: Family Medicine

## 2024-03-02 DIAGNOSIS — F17200 Nicotine dependence, unspecified, uncomplicated: Secondary | ICD-10-CM

## 2024-03-02 NOTE — Telephone Encounter (Signed)
 LOV: 01/27/24 NOV: nothing scheduled LAST REFILL: NICOTINE 21 MG/24HR PATCH

## 2024-03-17 ENCOUNTER — Ambulatory Visit (INDEPENDENT_AMBULATORY_CARE_PROVIDER_SITE_OTHER): Admitting: Clinical

## 2024-03-17 ENCOUNTER — Telehealth: Payer: Self-pay | Admitting: *Deleted

## 2024-03-17 DIAGNOSIS — F419 Anxiety disorder, unspecified: Secondary | ICD-10-CM | POA: Diagnosis not present

## 2024-03-17 NOTE — Progress Notes (Signed)
 [facility] Counselor/Therapist Progress Note  Patient ID: Carolyn Cordova, MRN: [id number]    Date: 03/17/24  Time Spent: 8:33  am - 9:18 am : 45 Minutes  Treatment Type: Individual Therapy.  Reported Symptoms: changes in memory  Mental Status Exam: Appearance:  Neat and Well Groomed     Behavior: Appropriate  Motor: Normal  Speech/Language:  Clear and Coherent and Normal Rate  Affect: Appropriate  Mood: normal  Thought process: normal  Thought content:   WNL  Sensory/Perceptual disturbances:   WNL  Orientation: oriented to person, place, and situation  Attention: Good  Concentration: Good  Memory: WNL  Fund of knowledge:  Good  Insight:   Good  Judgment:  Good  Impulse Control: Good   Risk Assessment: Danger to Self:  No Patient denied current suicidal ideation  Self-injurious Behavior: No Danger to Others: No Patient denied current homicidal ideation Duty to Warn:no Physical Aggression / Violence:No  Access to Firearms a concern: No  Gang Involvement:No   Subjective:  Patient stated, "ok", "I'm still a little forgetful", "I'm wondering if it's some of the medication". Patient inquired about discontinuing medications due to concerns related to changes in memory.  Patient reported patient was let go from patient's job due to difficulty concentrating at work. Patient reported difficulty concentrating started after patient's husband passed away. Patient stated, "I'm forgetful". Patient stated, "I think it's fine", "the majority of it is good" in response to patient's mood. Patient stated, "occasionally, its not constant or anything" in response to anxiety since last session. Patient reported "I've wondered if it was the medication I was on" in reference to cognitive changes. Patient reported she is open to a consultation with a psychiatrist and a neuro psych evaluation. Patient stated, "whatever I need to do" in response to participation in therapy. Patient  stated, "I think I'm better than I was" in reference to symptoms.   Interventions: Motivational Interviewing. Clinician conducted session via caregility video from clinician's office at [facility]. Patient provided verbal consent to proceed with telehealth session and is aware of limitations of telephone or video visits. Patient participated in session from patient's home. Reviewed events since last session and assessed for changes. Discussed patient following up with patient's PCP to discuss patient's request to discontinue medications. Clinician reviewed diagnosis and treatment recommendations. Provided psycho education related to diagnosis and treatment.   Collaboration of Care: Primary Care Provider AEB Patient provided verbal consent for clinician to disclose recommendations to patient's PCP, Dr. Deri Fleet. Following patient's appointment clinician spoke with Dr. Malissa Se to provide clinician's treatment recommendations.   Patient/Guardian was advised Release of Information must be obtained prior to any record release in order to collaborate their care with an outside provider.   Diagnosis:  Anxiety disorder, unspecified type   Plan: Goals to be determined during patient's follow up appointment on 03/25/24.        Burlene Carpen, LCSW

## 2024-03-17 NOTE — Telephone Encounter (Signed)
-----   Message from [facility] sent at 03/17/2024  9:24 AM EDT ----- Please let pt know I talked to Burlene Carpen and want to have her follow up with me to discuss possible medicine for mood  Please schedule when convenient  Thanks

## 2024-03-21 ENCOUNTER — Encounter: Payer: Self-pay | Admitting: Family Medicine

## 2024-03-21 ENCOUNTER — Ambulatory Visit: Admitting: Family Medicine

## 2024-03-21 VITALS — BP 122/68 | HR 61 | Temp 98.1°F | Ht 67.0 in | Wt 151.4 lb

## 2024-03-21 DIAGNOSIS — F4321 Adjustment disorder with depressed mood: Secondary | ICD-10-CM | POA: Diagnosis not present

## 2024-03-21 DIAGNOSIS — F419 Anxiety disorder, unspecified: Secondary | ICD-10-CM | POA: Diagnosis not present

## 2024-03-21 DIAGNOSIS — F17200 Nicotine dependence, unspecified, uncomplicated: Secondary | ICD-10-CM | POA: Diagnosis not present

## 2024-03-21 DIAGNOSIS — F32A Depression, unspecified: Secondary | ICD-10-CM

## 2024-03-21 DIAGNOSIS — R413 Other amnesia: Secondary | ICD-10-CM

## 2024-03-21 MED ORDER — FLUOXETINE HCL 10 MG PO CAPS: 10.0000 mg | ORAL_CAPSULE | Freq: Every day | ORAL | 1 refills | Status: AC

## 2024-03-21 MED ORDER — FLUOXETINE HCL 20 MG PO CAPS: 20.0000 mg | ORAL_CAPSULE | Freq: Every day | ORAL | 3 refills | Status: AC

## 2024-03-21 NOTE — Assessment & Plan Note (Signed)
 Seeing mental health counselor and taking fluoxetine  20 mg daily  Voiced frustration and anxiety over her loss of short term memory and also large life changes (living with daughter) and also the health of several family members  Denies feeling down but admits there is still sadness over loss of husband   Reviewed stressors/ coping techniques/symptoms/ support sources/ tx options and side effects in detail today Commended self care and outdoor time and exercise   Discussed the fact that anxious mood can worsen concentration and memory  Open to increase fluoxetine  from 20 to 30 mg daily (instructed to call if problems or side effects) and follow up in 2 mo  Will continue counseling with Burlene Carpen as well In meantime will have appointment with neurology this mo to address cognition  She feels currently safe

## 2024-03-21 NOTE — Assessment & Plan Note (Signed)
 Overall pt thinks she is doing pretty well with this

## 2024-03-21 NOTE — Assessment & Plan Note (Signed)
 Disc in detail risks of smoking and possible outcomes including copd, vascular/ heart disease, cancer , respiratory and sinus infections as well as osteoporosis  Pt voices understanding Pt has nicotine  patches/ is not ready to quit however

## 2024-03-21 NOTE — Assessment & Plan Note (Signed)
 Pt is more frustrated with short term memory loss and need to depend on other people  Adjusting fairly  Pt has no safety concerns at this time  Trying to keep brain and body active   Has appointment with neurology at the end of the month for eval and treatment   Plan to increase fluoxetine  to 30 mg daily to help more with anxiety (which can affect concentration) Call back and Er precautions noted in detail today

## 2024-03-21 NOTE — Progress Notes (Signed)
 Subjective:    Patient ID: Carolyn Cordova, female    DOB: 07-Jun-1961, 63 y.o.   MRN: [id number]  HPI  Wt Readings from Last 3 Encounters:  03/21/24 151 lb 6 oz (68.7 kg)  01/27/24 154 lb (69.9 kg)  12/29/23 154 lb 6 oz (70 kg)   23.71 kg/m  Vitals:   03/21/24 1416  BP: 122/68  Pulse: 61  Temp: 98.1 F (36.7 C)  SpO2: 98%    Pt presents to discuss mood in the context of memory loss   Last visit ref for mental health counseling Seeing Sharlyne Deans   Taking fluoxetine  20 mg daily  No side effects that she knows of  Thinks the medicine has helped     She will get anxious when something goes wrong or something breaks /needs a repair  Gets anxious when family members sick  She helps quite a bit (brother and mother)   Grief/loss of husband  Thankful that he is no longer suffering  Still misses him / can get sad   When working- was in a very fast paced environment to retired  It is a big change     Does not feel hopeless  Is retired and financially secure- feels good about that   Sleeps pretty good  Takes a little while to get to sleep / bad habit of watching tv  Does not wake up at night   Appetite is good Likes to eat and likes to get take out     Her mother is tough to deal with and can be hard on her /even hateful and resentful to her  A lot of responsibility  Can walk away when she needs to -has been working on that   [employer] care of her daughter's 3 dogs  This has been difficult   Loves to walk her pond  (is a good size/ a fair amount of exercise) = hikes  Be outside  Watch the birds  No longer cooped up in an office         03/21/2024    2:25 PM 01/27/2024   10:29 AM 12/29/2023    9:39 AM 11/19/2023   10:39 AM 10/31/2022   11:05 AM  Depression screen PHQ 2/9  Decreased Interest 0 0 0 0 3  Down, Depressed, Hopeless 1 0 0 0 3  PHQ - 2 Score 1 0 0 0 6  Altered sleeping 0 0 0 0 3  Tired, decreased energy 0 0 1 1 3   Change in appetite 0 0 0  0 0  Feeling bad or failure about yourself  0 0 0 0 3  Trouble concentrating 0 0 0 0 3  Moving slowly or fidgety/restless 0 0 0 0 0  Suicidal thoughts 0 0 0 0 0  PHQ-9 Score 1 0 1 1 18   Difficult doing work/chores Not difficult at all Not difficult at all Not difficult at all Not difficult at all Very difficult      03/21/2024    2:25 PM 01/27/2024   10:29 AM 12/29/2023    9:39 AM 11/19/2023   10:39 AM  GAD 7 : Generalized Anxiety Score  Nervous, Anxious, on Edge 1 1 1 1   Control/stop worrying 0 0 1 1  Worry too much - different things 0 1 1 1   Trouble relaxing 0 0 0 1  Restless 0 1 0 0  Easily annoyed or irritable 0 0 0 0  Afraid - awful might happen 0  0 0 0  Total GAD 7 Score 1 3 3 4   Anxiety Difficulty Not difficult at all Not difficult at all Somewhat difficult Not difficult at all    Still smoking Has nicotine  patches when ready to quit  No breathing problems    In the past sertraline was too sedating Wellbutrin was not tolerated -made her feel more depressed  Did not tolerate chantix    Memory problems are driving her crazy  Living with her daughter and this is an adjustment    Has appointment 04/12/24 neuro Dr Samara Crest   Patient Active Problem List   Diagnosis Date Noted   History of kidney stones 01/27/2024   B12 deficiency 10/28/2023   Memory changes 10/27/2023   Anxiety and depression 10/31/2022   Encounter for screening mammogram for breast cancer 09/25/2022   Grief 09/25/2022   Prediabetes 09/06/2021   Colon cancer screening 04/10/2015   Internal hemorrhoid 04/10/2015   Encounter for routine gynecological examination 03/17/2014   Routine general medical examination at a health care facility 03/09/2014   H/O herpes zoster 08/09/2012   Dry mouth 04/20/2012   Elevated rheumatoid factor 04/20/2012   HERNIATED CERVICAL DISC 09/14/2009   Pure hypercholesterolemia 05/10/2009   TOBACCO ABUSE 08/19/2007   CARPAL TUNNEL SYNDROME, BILATERAL 08/19/2007   Essential  hypertension 08/19/2007   HEMORRHOIDS 08/19/2007   History of cervical dysplasia 08/19/2007   MIGRAINES, HX OF 08/19/2007   Past Medical History:  Diagnosis Date   Allergy    seasonal   Carpal tunnel syndrome    Cervical disc disease    GERD (gastroesophageal reflux disease)    Hyperlipidemia    Hypertension    Migraine    PMDD (premenstrual dysphoric disorder)    Past Surgical History:  Procedure Laterality Date   BREAST BIOPSY Left    unknown when, X-clip, benign   CESAREAN SECTION     COLONOSCOPY     CRYOABLATION  09/1989   secondary to dysplasia   EXTRACORPOREAL SHOCK WAVE LITHOTRIPSY Right 10/02/2022   Procedure: EXTRACORPOREAL SHOCK WAVE LITHOTRIPSY (ESWL);  Surgeon: Geraline Knapp, MD;  Location: [facility];  Service: Urology;  Laterality: Right;   EXTRACORPOREAL SHOCK WAVE LITHOTRIPSY Right 10/16/2022   Procedure: EXTRACORPOREAL SHOCK WAVE LITHOTRIPSY (ESWL);  Surgeon: Dustin Gimenez, MD;  Location: [facility];  Service: Urology;  Laterality: Right;   lipoma removal  2006   PILONIDAL CYST EXCISION  1980s   x2   plantar fasciitis  06/2011   surgery with Dr Lara Plants   POLYPECTOMY     SPINE SURGERY  01/2010   cervical spine surgery   Social History   Tobacco Use   Smoking status: Every Day    Current packs/day: 1.00    Types: Cigarettes   Smokeless tobacco: Never  Vaping Use   Vaping status: Never Used  Substance Use Topics   Alcohol use: No    Alcohol/week: 0.0 standard drinks of alcohol   Drug use: No   Family History  Problem Relation Age of Onset   Hyperlipidemia Mother    Cancer Mother        breast CA   Breast cancer Mother    Cancer Father        adrenal cancer   Colon polyps Father    Hyperlipidemia Father    Breast cancer Maternal Aunt    Breast cancer Paternal Aunt    Breast cancer Maternal Grandmother    Cancer Cousin        breast CA  Hyperlipidemia Brother    Colon cancer Neg Hx    Esophageal cancer Neg Hx    Rectal cancer Neg Hx     Stomach cancer Neg Hx    Allergies  Allergen Reactions   Adhesive [Tape]    Amoxicillin-Pot Clavulanate     REACTION: rash   Codeine     REACTION: ? reaction   Fexofenadine     REACTION: nausea   Sertraline Hcl     REACTION: sedation   Varenicline Tartrate     REACTION: made her feel sick and did not work   Wellbutrin [Bupropion] Other (See Comments)   Current Outpatient Medications on File Prior to Visit  Medication Sig Dispense Refill   amLODipine  (NORVASC ) 5 MG tablet Take 1 tablet (5 mg total) by mouth daily. 90 tablet 3   aspirin 81 MG tablet Take 81 mg by mouth daily.     cetirizine (ZYRTEC) 10 MG tablet Take 10 mg by mouth daily.     cevimeline  (EVOXAC ) 30 MG capsule TAKE 1 CAPSULE BY MOUTH 3 TIMES A DAY 270 capsule 3   cyanocobalamin  (VITAMIN B12) 1000 MCG tablet Take 1,000 mcg by mouth daily.     ezetimibe  (ZETIA ) 10 MG tablet Take 1 tablet (10 mg total) by mouth daily. 90 tablet 3   rosuvastatin  (CRESTOR ) 5 MG tablet Take 1 tablet (5 mg total) by mouth daily. 30 tablet 1   tamsulosin  (FLOMAX ) 0.4 MG CAPS capsule TAKE 1 CAPSULE BY MOUTH EVERY DAY 90 capsule 3   No current facility-administered medications on file prior to visit.    Review of Systems  Constitutional:  Negative for activity change, appetite change, fatigue, fever and unexpected weight change.  HENT:  Negative for congestion, ear pain, rhinorrhea, sinus pressure and sore throat.   Eyes:  Negative for pain, redness and visual disturbance.  Respiratory:  Negative for cough, shortness of breath and wheezing.   Cardiovascular:  Negative for chest pain and palpitations.  Gastrointestinal:  Negative for abdominal pain, blood in stool, constipation and diarrhea.  Endocrine: Negative for polydipsia and polyuria.  Genitourinary:  Negative for dysuria, frequency and urgency.  Musculoskeletal:  Negative for arthralgias, back pain and myalgias.  Skin:  Negative for pallor and rash.  Allergic/Immunologic:  Negative for environmental allergies.  Neurological:  Negative for dizziness, syncope and headaches.  Hematological:  Negative for adenopathy. Does not bruise/bleed easily.  Psychiatric/Behavioral:  Positive for decreased concentration. Negative for confusion, dysphoric mood, self-injury and sleep disturbance. The patient is not nervous/anxious.        Objective:   Physical Exam Constitutional:      General: She is not in acute distress.    Appearance: Normal appearance. She is well-developed and normal weight. She is not ill-appearing or diaphoretic.  HENT:     Head: Normocephalic and atraumatic.  Eyes:     Conjunctiva/sclera: Conjunctivae normal.     Pupils: Pupils are equal, round, and reactive to light.  Neck:     Thyroid : No thyromegaly.     Vascular: No carotid bruit or JVD.  Cardiovascular:     Rate and Rhythm: Normal rate and regular rhythm.     Heart sounds: Normal heart sounds.     No gallop.  Pulmonary:     Effort: Pulmonary effort is normal. No respiratory distress.     Breath sounds: Normal breath sounds. No stridor. No wheezing, rhonchi or rales.     Comments: Bs are mildly distant  No wheeze  Abdominal:  General: There is no distension or abdominal bruit.     Palpations: Abdomen is soft.  Musculoskeletal:     Cervical back: Normal range of motion and neck supple.     Right lower leg: No edema.     Left lower leg: No edema.  Lymphadenopathy:     Cervical: No cervical adenopathy.  Skin:    General: Skin is warm and dry.     Coloration: Skin is not pale.     Findings: No rash.  Neurological:     Mental Status: She is alert.     Coordination: Coordination normal.     Deep Tendon Reflexes: Reflexes are normal and symmetric. Reflexes normal.  Psychiatric:        Attention and Perception: Attention normal.        Mood and Affect: Mood is anxious. Mood is not depressed. Affect is not blunt, flat or tearful.        Speech: Speech normal.     Comments:  Pleasant  Attentive  Not tearful   Mildly anxious  Candidly discusses symptoms and stressors   Voices frustration about short term memory problems              Assessment & Plan:   Problem List Items Addressed This Visit       Other   TOBACCO ABUSE   Disc in detail risks of smoking and possible outcomes including copd, vascular/ heart disease, cancer , respiratory and sinus infections as well as osteoporosis  Pt voices understanding Pt has nicotine  patches/ is not ready to quit however       Memory changes   Pt is more frustrated with short term memory loss and need to depend on other people  Adjusting fairly  Pt has no safety concerns at this time  Trying to keep brain and body active   Has appointment with neurology at the end of the month for eval and treatment   Plan to increase fluoxetine  to 30 mg daily to help more with anxiety (which can affect concentration) Call back and Er precautions noted in detail today        Grief   Overall pt thinks she is doing pretty well with this       Anxiety and depression - Primary   Seeing mental health counselor and taking fluoxetine  20 mg daily  Voiced frustration and anxiety over her loss of short term memory and also large life changes (living with daughter) and also the health of several family members  Denies feeling down but admits there is still sadness over loss of husband   Reviewed stressors/ coping techniques/symptoms/ support sources/ tx options and side effects in detail today Commended self care and outdoor time and exercise   Discussed the fact that anxious mood can worsen concentration and memory  Open to increase fluoxetine  from 20 to 30 mg daily (instructed to call if problems or side effects) and follow up in 2 mo  Will continue counseling with Burlene Carpen as well In meantime will have appointment with neurology this mo to address cognition  She feels currently safe         Relevant Medications    FLUoxetine  (PROZAC ) 10 MG capsule   FLUoxetine  (PROZAC ) 20 MG capsule

## 2024-03-21 NOTE — Patient Instructions (Addendum)
 Increase fluoxetine  from 20 mg to 30 mg once daily  Take the 20 mg and a 10 mg together once daily   If any side effects or problems let us  know   See the neurologist at the end of the month as planned  Take a family member with you   Continue your counseling   Follow up here in about 2 months   Don't get overheated outdoors Drink lots of fluids  Don't forget to eat regularly

## 2024-03-22 ENCOUNTER — Telehealth (HOSPITAL_BASED_OUTPATIENT_CLINIC_OR_DEPARTMENT_OTHER): Payer: Self-pay | Admitting: Internal Medicine

## 2024-03-22 ENCOUNTER — Encounter (HOSPITAL_BASED_OUTPATIENT_CLINIC_OR_DEPARTMENT_OTHER): Admitting: Internal Medicine

## 2024-03-22 NOTE — Telephone Encounter (Signed)
 Patient is following-up on lab orders to be done prior to next visit on 5/23.

## 2024-03-22 NOTE — Telephone Encounter (Signed)
 Returned call to patient, she wanted to know if she could have her labs done at her PCP office- advised if they are willing to draw that is okay with us .

## 2024-03-25 ENCOUNTER — Ambulatory Visit (INDEPENDENT_AMBULATORY_CARE_PROVIDER_SITE_OTHER): Admitting: Clinical

## 2024-03-25 DIAGNOSIS — F419 Anxiety disorder, unspecified: Secondary | ICD-10-CM | POA: Diagnosis not present

## 2024-03-25 NOTE — Progress Notes (Signed)
 [facility] Counselor/Therapist Progress Note  Patient ID: Carolyn Cordova, MRN: [id number]    Date: 03/25/24  Time Spent: 2:43 pm - 3:30 pm : 47 Minutes  Treatment Type: Individual Therapy.  Reported Symptoms: Patient reported experiencing anxiety and forgetfulness at times  Mental Status Exam: Appearance:  Neat and Well Groomed     Behavior: Appropriate  Motor: Normal  Speech/Language:  Clear and Coherent and Normal Rate  Affect: Appropriate  Mood: normal  Thought process: normal  Thought content:   WNL  Sensory/Perceptual disturbances:   WNL  Orientation: oriented to person, place, and situation  Attention: Good  Concentration: Good  Memory: WNL  Fund of knowledge:  Good  Insight:   Good  Judgment:  Good  Impulse Control: Good   Risk Assessment: Danger to Self:  No Patient denied current suicidal ideation  Self-injurious Behavior: No Danger to Others: No Patient denied current homicidal ideation Duty to Warn:no Physical Aggression / Violence:No  Access to Firearms a concern: No  Gang Involvement:No   Subjective:  Patient stated, "I seem to be doing better", "I'm getting out doing different things, working in the yard, seeing people, more social".  Patient stated, "I feel like I'm more my old self", "I don't think I'm as forgetful as I was" in reference to patient's presentation prior to the loss of patient's husband. Patient stated, "I feel good" in response to patient's current mood. Patient stated, "I think I'm doing better", "I don't know how much therapy I would need or how often or if I need it anymore". Patient stated, "I don't have any bad feelings, I'm more at peace than I was". Patient stated, "I don't think it's as bad as it was, its not as frequency as it was" in reference to symptoms of anxiety. Patient stated, "I'm pretty content right now". Patient stated, "I think that's getting better" in response to symptoms of anxiety. Patient stated,  "staying busy is my best therapy". Patient stated, "I'm not as stressed, occasionally I get stressed". Patient stated, "I feel better than I did when we started". [name] reported patient exhibits "fixation on things" and relies on daughter to determine their activities for the day. Patient requested to schedule a follow up appointment to continue the conversation regarding patient's participation in therapy. Patient reported patient's other daughter will be visiting this weekend and patient would like daughter's input regarding patient's participation in therapy.   Interventions: Motivational Interviewing. Clinician conducted session via caregility video from clinician's home office. Patient provided verbal consent to proceed with telehealth session and is aware of limitations of telephone or video visits. Patient participated in session from patient's home. [name] was present at the end of today's session and patient provided verbal consent for daughter to be present and provide collateral information. Reviewed events since last session and assessed for changes. Clinician utilized motivational interviewing to explore potential goals for therapy and patient's ambivalence regarding participation in therapy. Provided psycho education related to participation in therapy.   Collaboration of Care: Other not required at this time  Diagnosis:  Anxiety disorder, unspecified type   Plan: Goals to be determined during patient's follow up appointment on 04/18/24 due to patient's ambivalence regarding participation in therapy.

## 2024-03-25 NOTE — Progress Notes (Signed)
   Doree Barthel, LCSW

## 2024-03-29 ENCOUNTER — Other Ambulatory Visit (HOSPITAL_BASED_OUTPATIENT_CLINIC_OR_DEPARTMENT_OTHER): Payer: Self-pay | Admitting: Internal Medicine

## 2024-04-08 ENCOUNTER — Ambulatory Visit (HOSPITAL_BASED_OUTPATIENT_CLINIC_OR_DEPARTMENT_OTHER): Admitting: Internal Medicine

## 2024-04-08 VITALS — BP 98/60 | HR 70 | Ht 67.0 in | Wt 152.0 lb

## 2024-04-08 DIAGNOSIS — M791 Myalgia, unspecified site: Secondary | ICD-10-CM

## 2024-04-08 DIAGNOSIS — E785 Hyperlipidemia, unspecified: Secondary | ICD-10-CM

## 2024-04-08 DIAGNOSIS — I2510 Atherosclerotic heart disease of native coronary artery without angina pectoris: Secondary | ICD-10-CM

## 2024-04-08 DIAGNOSIS — T466X5D Adverse effect of antihyperlipidemic and antiarteriosclerotic drugs, subsequent encounter: Secondary | ICD-10-CM | POA: Diagnosis not present

## 2024-04-08 NOTE — Patient Instructions (Signed)
 Medication Instructions:  Your physician recommends that you continue on your current medications as directed. Please refer to the Current Medication list given to you today.  *If you need a refill on your cardiac medications before your next appointment, please call your pharmacy*  Lab Work: FASTING - Lipid panel done as soon as able If you have labs (blood work) drawn today and your tests are completely normal, you will receive your results only by: MyChart Message (if you have MyChart) OR A paper copy in the mail If you have any lab test that is abnormal or we need to change your treatment, we will call you to review the results.  Follow-Up: At [facility], you and your health needs are our priority.  As part of our continuing mission to provide you with exceptional heart care, our providers are all part of one team.  This team includes your primary Cardiologist (physician) and Advanced Practice Providers or APPs (Physician Assistants and Nurse Practitioners) who all work together to provide you with the care you need, when you need it.  Your next appointment:   Pending lab results - TBD with Dr. Maximo Spar- Lipid Clinic

## 2024-04-08 NOTE — Progress Notes (Signed)
 LIPID CLINIC CONSULT NOTE  Chief Complaint:  Manage dyslipidemia  Primary Care Physician: Tower, Manley Seeds, MD  Primary Cardiologist:  None  HPI:  Carolyn Cordova is a 63 y.o. female who is being seen today for the evaluation of dyslipidemia at the request of Tower, Manley Seeds, MD. this is a pleasant 63 year old female kindly referred for evaluation management of dyslipidemia.  Discussed that has a history of heart disease in the family including her father who had sudden cardiac death in his 75s but had a prior cardiovascular event that was unrecognized.  Her mother has a history of hypertension.  Her only risk factors include hypertension and age.  She does take low-dose aspirin more as a preventative.  Most recently in April 22 her total cholesterol was 251, HDL 52, triglycerides 113 and LDL 176.  Her LDL cholesterol had been as low as the 90s in the past probably on a statin however she did not tolerate it.  She has had myalgias mostly with statins including rosuvastatin , atorvastatin  and possibly others.  She says she took brand-name Crestor  at one point and seemed to tolerate that better than rosuvastatin  generic.  Diet is somewhat over the place right now.  She is caring for her husband at this time who is struggling with some health issues.  04/08/2024  Carolyn Cordova returns today for follow-up.  She had recent repeat lipids which showed total cholesterol 179, triglycerides 94, HDL 52 and LDL 108.  I have not seen her in more than a year.  She did see her primary care provider in February when these labs were drawn and was seen subsequently by pharmacy who recommended adding ezetimibe  to her medication list.  She has not had repeat lipids since adding this.  Of note her blood pressure was low today at 98/60 but she is asymptomatic.  PMHx:  Past Medical History:  Diagnosis Date   Allergy    seasonal   Carpal tunnel syndrome    Cervical disc disease    GERD (gastroesophageal reflux  disease)    Hyperlipidemia    Hypertension    Migraine    PMDD (premenstrual dysphoric disorder)     Past Surgical History:  Procedure Laterality Date   BREAST BIOPSY Left    unknown when, X-clip, benign   CESAREAN SECTION     COLONOSCOPY     CRYOABLATION  09/1989   secondary to dysplasia   EXTRACORPOREAL SHOCK WAVE LITHOTRIPSY Right 10/02/2022   Procedure: EXTRACORPOREAL SHOCK WAVE LITHOTRIPSY (ESWL);  Surgeon: Geraline Knapp, MD;  Location: [facility];  Service: Urology;  Laterality: Right;   EXTRACORPOREAL SHOCK WAVE LITHOTRIPSY Right 10/16/2022   Procedure: EXTRACORPOREAL SHOCK WAVE LITHOTRIPSY (ESWL);  Surgeon: Dustin Gimenez, MD;  Location: [facility];  Service: Urology;  Laterality: Right;   lipoma removal  2006   PILONIDAL CYST EXCISION  1980s   x2   plantar fasciitis  06/2011   surgery with Dr Lara Plants   POLYPECTOMY     SPINE SURGERY  01/2010   cervical spine surgery    FAMHx:  Family History  Problem Relation Age of Onset   Hyperlipidemia Mother    Cancer Mother        breast CA   Breast cancer Mother    Cancer Father        adrenal cancer   Colon polyps Father    Hyperlipidemia Father    Breast cancer Maternal Aunt    Breast cancer Paternal Aunt  Breast cancer Maternal Grandmother    Cancer Cousin        breast CA   Hyperlipidemia Brother    Colon cancer Neg Hx    Esophageal cancer Neg Hx    Rectal cancer Neg Hx    Stomach cancer Neg Hx     SOCHx:   reports that she has been smoking cigarettes. She has never used smokeless tobacco. She reports that she does not drink alcohol and does not use drugs.  ALLERGIES:  Allergies  Allergen Reactions   Adhesive [Tape]    Amoxicillin-Pot Clavulanate     REACTION: rash   Codeine     REACTION: ? reaction   Fexofenadine     REACTION: nausea   Sertraline Hcl     REACTION: sedation   Varenicline Tartrate     REACTION: made her feel sick and did not work   Wellbutrin [Bupropion] Other (See Comments)     ROS: Pertinent items noted in HPI and remainder of comprehensive ROS otherwise negative.  HOME MEDS: Current Outpatient Medications on File Prior to Visit  Medication Sig Dispense Refill   amLODipine  (NORVASC ) 5 MG tablet Take 1 tablet (5 mg total) by mouth daily. 90 tablet 3   aspirin 81 MG tablet Take 81 mg by mouth daily.     cetirizine (ZYRTEC) 10 MG tablet Take 10 mg by mouth daily.     cevimeline  (EVOXAC ) 30 MG capsule TAKE 1 CAPSULE BY MOUTH 3 TIMES A DAY (Patient taking differently: Take 30 mg by mouth 2 (two) times daily. TAKE 1 CAPSULE BY MOUTH 3 TIMES A DAY) 270 capsule 3   cyanocobalamin  (VITAMIN B12) 1000 MCG tablet Take 1,000 mcg by mouth daily.     ezetimibe  (ZETIA ) 10 MG tablet Take 1 tablet (10 mg total) by mouth daily. 90 tablet 3   FLUoxetine  (PROZAC ) 10 MG capsule Take 1 capsule (10 mg total) by mouth daily. With the 20 mg fluoxetine  capsule 90 capsule 1   FLUoxetine  (PROZAC ) 20 MG capsule Take 1 capsule (20 mg total) by mouth daily. With the 10 mg fluoxetine  capsule 90 capsule 3   rosuvastatin  (CRESTOR ) 5 MG tablet Take 1 tablet (5 mg total) by mouth daily. 30 tablet 1   tamsulosin  (FLOMAX ) 0.4 MG CAPS capsule TAKE 1 CAPSULE BY MOUTH EVERY DAY 90 capsule 3   No current facility-administered medications on file prior to visit.    LABS/IMAGING: No results found for this or any previous visit (from the past 48 hours). No results found.  LIPID PANEL:    Component Value Date/Time   CHOL 179 12/22/2023 0747   TRIG 94.0 12/22/2023 0747   HDL 52.40 12/22/2023 0747   CHOLHDL 3 12/22/2023 0747   VLDL 18.8 12/22/2023 0747   LDLCALC 108 (H) 12/22/2023 0747   LDLCALC 167 (H) 10/07/2019 1554   LDLDIRECT 150.0 08/05/2017 1719    WEIGHTS: Wt Readings from Last 3 Encounters:  04/08/24 152 lb (68.9 kg)  03/21/24 151 lb 6 oz (68.7 kg)  01/27/24 154 lb (69.9 kg)    VITALS: BP 98/60   Pulse 70   Ht 5\' 7"  (1.702 m)   Wt 152 lb (68.9 kg)   SpO2 100%   BMI 23.81  kg/m   EXAM: Deferred  EKG: N/A  ASSESSMENT: Mixed dyslipidemia, goal LDL less than 70 Statin intolerance-myalgias Family history of premature coronary disease and high cholesterol CAC score of 45.7, 83rd percentile (2022) Negative LP(a)  PLAN: 1.   Carolyn Cordova turns  today for follow-up.  Have not seen her in some time.  She saw her PCP earlier this year and was started on ezetimibe  in addition to low-dose rosuvastatin .  She has not had repeat labs.  Will like to obtain new labs fasting to see if were at target.  If were not they are we could consider PCSK9 number however cost has been an issue with this in the past and her insurance did not approve it because her LDL is not over 190.  Alternatively we could consider adding Nexletol to her Zetia  if we need further lipid-lowering.  Hazle Lites, MD, [facility], FNLA, FACP  [place]  [facility]  Medical Director of the Advanced Lipid Disorders &  Cardiovascular Risk Reduction Clinic Diplomate of the American Board of Clinical Lipidology Attending Cardiologist  Direct Dial: [phone]  Fax: [phone]  Website:  [email or web address]   Aviva Lemmings Arna Luis 04/08/2024, 1:32 PM

## 2024-04-12 ENCOUNTER — Encounter: Payer: Self-pay | Admitting: Neurology

## 2024-04-12 ENCOUNTER — Ambulatory Visit: Admitting: Neurology

## 2024-04-12 VITALS — BP 132/76 | HR 74 | Ht 69.0 in | Wt 151.5 lb

## 2024-04-12 DIAGNOSIS — R4189 Other symptoms and signs involving cognitive functions and awareness: Secondary | ICD-10-CM

## 2024-04-12 DIAGNOSIS — G3184 Mild cognitive impairment, so stated: Secondary | ICD-10-CM | POA: Diagnosis not present

## 2024-04-12 NOTE — Progress Notes (Signed)
 [facility]  PATIENT: Carolyn Cordova DOB: Apr 06, 1961  REQUESTING CLINICIAN: Tower, Manley Seeds, MD HISTORY FROM: Patient/Daughter  REASON FOR VISIT: Memory loss    HISTORICAL  CHIEF COMPLAINT:  Chief Complaint  Patient presents with   New Patient (Initial Visit)    Rm 13. MMSE NP Internal referral for short term memory change. MMSE Score: 22    HISTORY OF PRESENT ILLNESS:  This is a 63 year old woman past medical history of hypertension, hyperlipidemia, depression who is presenting with memory complaint.  She is accompanied by her daughter.  Patient tells me that her memory loss started since the death of her husband in Aug 22, 2022.  Memory loss described as "cannot remember things that I should remember".  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  She currently lives with her daughter who tells me that her memory has been worse since losing her husband.  Husband was battling liver disease for 2 years prior to passing away and this was 2 months after patient also losing her father-in-law.  She tells me she did not receive any grief counseling but was later diagnosed with depression and put on Zoloft.  She recently started therapy. Daughter tells me that she is very forgetful, she gets fixating on things, she can forget what she had for dinner 10 minutes after dinner.  Daughter tells me the other day she asked her home address where she lived for more than 30 years and lived in that neighborhood all of her life.  She still drives, denies any recent accident but only drives short distance, she still independent in her ADLs.   TBI:   No past history of TBI Stroke:   no past history of stroke Seizures:   no past history of seizures Sleep:   no history of sleep apnea.  Mood: On Prozac  for depression Family history of Dementia: Denies   Functional status: independent  in all ADLs and IADLs Patient lives with daughter. Cooking: use to cook but not anymore, difficulty following direction  Cleaning: no issues Shopping: not much, online orders  Bathing: no issues Toileting: no issues  Driving: yes, but small routes  Bills: auto draft  Medications: with pill box  Ever left the stove on by accident?: yes Forget how to use items around the house?: Denies  Getting lost going to familiar places?: denies  Forgetting loved ones names?: Denies Word finding difficulty? Sometimes  Sleep: Good    OTHER MEDICAL CONDITIONS: Hypertension, Hyperlipidemia, Depression   REVIEW OF SYSTEMS: Full 14 system review of systems performed and negative with exception of: As noted in the HPI   ALLERGIES: Allergies  Allergen Reactions   Adhesive [Tape]    Amoxicillin-Pot Clavulanate     REACTION: rash   Codeine     REACTION: ? reaction   Fexofenadine     REACTION: nausea   Sertraline Hcl     REACTION: sedation   Varenicline Tartrate     REACTION: made her feel sick and did not work   Wellbutrin [Bupropion] Other (See Comments)    HOME MEDICATIONS: Outpatient Medications Prior to Visit  Medication Sig Dispense Refill   amLODipine  (NORVASC ) 5 MG tablet Take 1 tablet (5 mg total) by mouth daily. 90 tablet 3   aspirin 81 MG tablet Take 81 mg by mouth daily.     cetirizine (ZYRTEC) 10 MG tablet Take 10  mg by mouth daily.     cevimeline  (EVOXAC ) 30 MG capsule TAKE 1 CAPSULE BY MOUTH 3 TIMES A DAY (Patient taking differently: Take 30 mg by mouth 2 (two) times daily. TAKE 1 CAPSULE BY MOUTH 3 TIMES A DAY) 270 capsule 3   cyanocobalamin  (VITAMIN B12) 1000 MCG tablet Take 1,000 mcg by mouth daily.     ezetimibe  (ZETIA ) 10 MG tablet Take 1 tablet (10 mg total) by mouth daily. 90 tablet 3   FLUoxetine  (PROZAC ) 10 MG capsule Take 1 capsule (10 mg total) by mouth daily. With the 20 mg fluoxetine  capsule 90 capsule 1   FLUoxetine  (PROZAC ) 20 MG capsule Take 1 capsule (20 mg  total) by mouth daily. With the 10 mg fluoxetine  capsule 90 capsule 3   rosuvastatin  (CRESTOR ) 5 MG tablet Take 1 tablet (5 mg total) by mouth daily. 30 tablet 1   tamsulosin  (FLOMAX ) 0.4 MG CAPS capsule TAKE 1 CAPSULE BY MOUTH EVERY DAY 90 capsule 3   No facility-administered medications prior to visit.    PAST MEDICAL HISTORY: Past Medical History:  Diagnosis Date   Allergy    seasonal   Carpal tunnel syndrome    Cervical disc disease    GERD (gastroesophageal reflux disease)    Hyperlipidemia    Hypertension    Migraine    PMDD (premenstrual dysphoric disorder)     PAST SURGICAL HISTORY: Past Surgical History:  Procedure Laterality Date   BREAST BIOPSY Left    unknown when, X-clip, benign   CESAREAN SECTION     COLONOSCOPY     CRYOABLATION  09/1989   secondary to dysplasia   EXTRACORPOREAL SHOCK WAVE LITHOTRIPSY Right 10/02/2022   Procedure: EXTRACORPOREAL SHOCK WAVE LITHOTRIPSY (ESWL);  Surgeon: Geraline Knapp, MD;  Location: [facility];  Service: Urology;  Laterality: Right;   EXTRACORPOREAL SHOCK WAVE LITHOTRIPSY Right 10/16/2022   Procedure: EXTRACORPOREAL SHOCK WAVE LITHOTRIPSY (ESWL);  Surgeon: Dustin Gimenez, MD;  Location: [facility];  Service: Urology;  Laterality: Right;   lipoma removal  2006   PILONIDAL CYST EXCISION  1980s   x2   plantar fasciitis  06/2011   surgery with Dr Lara Plants   POLYPECTOMY     SPINE SURGERY  01/2010   cervical spine surgery    FAMILY HISTORY: Family History  Problem Relation Age of Onset   Hyperlipidemia Mother    Cancer Mother        breast CA   Breast cancer Mother    Cancer Father        adrenal cancer   Colon polyps Father    Hyperlipidemia Father    Breast cancer Maternal Aunt    Breast cancer Paternal Aunt    Breast cancer Maternal Grandmother    Cancer Cousin        breast CA   Hyperlipidemia Brother    Colon cancer Neg Hx    Esophageal cancer Neg Hx    Rectal cancer Neg Hx    Stomach cancer Neg Hx      SOCIAL HISTORY: Social History   Socioeconomic History   Marital status: Widowed    Spouse name: Not on file   Number of children: Not on file   Years of education: Not on file   Highest education level: 12th grade  Occupational History   Not on file  Tobacco Use   Smoking status: Every Day    Current packs/day: 1.00    Types: Cigarettes   Smokeless tobacco: Never  Vaping Use  Vaping status: Never Used  Substance and Sexual Activity   Alcohol use: No    Alcohol/week: 0.0 standard drinks of alcohol   Drug use: No   Sexual activity: Not Currently  Other Topics Concern   Not on file  Social History Narrative   Not on file   Social Drivers of Health   Financial Resource Strain: Low Risk  (12/25/2023)   Overall Financial Resource Strain (CARDIA)    Difficulty of Paying Living Expenses: Not hard at all  Food Insecurity: No Food Insecurity (12/25/2023)   Hunger Vital Sign    Worried About Running Out of Food in the Last Year: Never true    Ran Out of Food in the Last Year: Never true  Transportation Needs: No Transportation Needs (12/25/2023)   PRAPARE - Administrator, Civil Service (Medical): No    Lack of Transportation (Non-Medical): No  Physical Activity: Insufficiently Active (12/25/2023)   Exercise Vital Sign    Days of Exercise per Week: 5 days    Minutes of Exercise per Session: 20 min  Stress: Stress Concern Present (12/25/2023)   [employer] of Occupational Health - Occupational Stress Questionnaire    Feeling of Stress : To some extent  Social Connections: Moderately Integrated (12/25/2023)   Social Connection and Isolation Panel [NHANES]    Frequency of Communication with Friends and Family: More than three times a week    Frequency of Social Gatherings with Friends and Family: More than three times a week    Attends Religious Services: More than 4 times per year    Active Member of [employer] or Organizations: Yes    Attends Banker  Meetings: Not on file    Marital Status: Widowed  Intimate Partner Violence: Not on file     PHYSICAL EXAM   GENERAL EXAM/CONSTITUTIONAL: Vitals:  Vitals:   04/12/24 1304  BP: 132/76  Pulse: 74  Weight: 151 lb 8 oz (68.7 kg)  Height: 5\' 9"  (1.753 m)   Body mass index is 22.37 kg/m. Wt Readings from Last 3 Encounters:  04/12/24 151 lb 8 oz (68.7 kg)  04/08/24 152 lb (68.9 kg)  03/21/24 151 lb 6 oz (68.7 kg)   Patient is in no distress; well developed, nourished and groomed; neck is supple  MUSCULOSKELETAL: Gait, strength, tone, movements noted in Neurologic exam below  NEUROLOGIC: MENTAL STATUS:     04/12/2024    1:09 PM  MMSE - Mini Mental State Exam  Orientation to time 4  Orientation to Place 5  Registration 3  Attention/ Calculation 2  Recall 0  Language- name 2 objects 2  Language- repeat 1  Language- follow 3 step command 3  Language- read & follow direction 1  Write a sentence 1  Copy design 0  Total score 22   awake, alert, oriented to person, place and time recent and remote memory intact normal attention and concentration language fluent, comprehension intact, naming intact fund of knowledge appropriate  CRANIAL NERVE:  2nd, 3rd, 4th, 6th- visual fields full to confrontation, extraocular muscles intact, no nystagmus 5th - facial sensation symmetric 7th - facial strength symmetric 8th - hearing intact 9th - palate elevates symmetrically, uvula midline 11th - shoulder shrug symmetric 12th - tongue protrusion midline  MOTOR:  normal bulk and tone, full strength in the BUE, BLE  SENSORY:  normal and symmetric to light touch  COORDINATION:  finger-nose-finger, fine finger movements normal  GAIT/STATION:  normal   DIAGNOSTIC DATA (LABS,  IMAGING, TESTING) - I reviewed patient records, labs, notes, testing and imaging myself where available.  Lab Results  Component Value Date   WBC 6.8 12/22/2023   HGB 16.2 (H) 12/22/2023   HCT 47.7  (H) 12/22/2023   MCV 94.3 12/22/2023   PLT 233.0 12/22/2023      Component Value Date/Time   NA 141 12/22/2023 0747   K 4.3 12/22/2023 0747   CL 105 12/22/2023 0747   CO2 27 12/22/2023 0747   GLUCOSE 100 (H) 12/22/2023 0747   BUN 15 12/22/2023 0747   CREATININE 0.83 12/22/2023 0747   CREATININE 0.67 10/07/2019 1554   CALCIUM  9.3 12/22/2023 0747   PROT 6.5 12/22/2023 0747   ALBUMIN 4.5 12/22/2023 0747   AST 12 12/22/2023 0747   ALT 16 12/22/2023 0747   ALKPHOS 94 12/22/2023 0747   BILITOT 0.6 12/22/2023 0747   GFRNONAA >60 02/22/2022 1401   GFRAA  12/28/2009 1522    >60        The eGFR has been calculated using the MDRD equation. This calculation has not been validated in all clinical situations. eGFR's persistently <60 mL/min signify possible Chronic Kidney Disease.   Lab Results  Component Value Date   CHOL 179 12/22/2023   HDL 52.40 12/22/2023   LDLCALC 108 (H) 12/22/2023   LDLDIRECT 150.0 08/05/2017   TRIG 94.0 12/22/2023   CHOLHDL 3 12/22/2023   Lab Results  Component Value Date   HGBA1C 5.8 10/27/2023   Lab Results  Component Value Date   VITAMINB12 923 (H) 02/24/2024   Lab Results  Component Value Date   TSH 1.68 12/22/2023    Head CT 02/24/2024 Atrophy and chronic small vessel ischemic changes. No acute intracranial process identified.    ASSESSMENT AND PLAN  63 y.o. year old female with history of hypertension, hyperlipidemia, depression who is presenting with memory loss for the past 2 years and getting worse.  Memory loss described as being forgetful, lack of attention, but she is independent all of her ADLs.  Memory loss started after patient lost her husband of almost 30 years.  On exam, she appeared a bit withdrawn.  She scored 22 on MMSE.  I have informed patient and family that I suspect her memory impairment is due to ongoing depression.  I will refer her for formal neuropsychological testing to evaluate for MCI versus pseudodementia.  I  will also obtain the ATN profile and APO E4 genotype for risk stratification.  I will contact the patient to go over the result.  Advised her to continue following up with her PCP and to consider referral to psychiatry for management of ongoing depression.   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 Pseudodementia      Patient Instructions  Dementia labs including ATN profile and APoE4 genotype  Referral to formal neuropsychological evaluation  Continue to follow up with PCP  Consider referral to psychiatry  Return as needed    There are well-accepted and sensible ways to reduce risk for Alzheimers disease and other degenerative brain disorders .  Exercise Daily Walk A daily 20 minute walk should be part of your routine. Disease related apathy can be a significant roadblock to exercise and the only way to overcome this is to make it a daily routine and perhaps have a reward at the end (something your loved one loves to eat or drink perhaps) or a personal trainer coming to the home can also be very useful. Most  importantly, the patient is much more likely to exercise if the caregiver / spouse does it with him/her. In general a structured, repetitive schedule is best.  General Health: Any diseases which effect your body will effect your brain such as a pneumonia, urinary infection, blood clot, heart attack or stroke. Keep contact with your primary care doctor for regular follow ups.  Sleep. A good nights sleep is healthy for the brain. Seven hours is recommended. If you have insomnia or poor sleep habits we can give you some instructions. If you have sleep apnea wear your mask.  Diet: Eating a heart healthy diet is also a good idea; fish and poultry instead of red meat, nuts (mostly non-peanuts), vegetables, fruits, olive oil or canola oil (instead of butter), minimal salt (use other spices to flavor foods), whole grain rice, bread, cereal and pasta and wine in  moderation.Research is now showing that the MIND diet, which is a combination of The Mediterranean diet and the DASH diet, is beneficial for cognitive processing and longevity. Information about this diet can be found in The MIND Diet, a book by Andria Keeler, MS, RDN, and online at [employer].es  Finances, Power of [address] and Advance Directives: You should consider putting legal safeguards in place with regard to financial and medical decision making. While the spouse always has power of attorney for medical and financial issues in the absence of any form, you should consider what you want in case the spouse / caregiver is no longer around or capable of making decisions.   Orders Placed This Encounter  Procedures   ATN PROFILE   APOE Alzheimer's Risk    No orders of the defined types were placed in this encounter.   Return if symptoms worsen or fail to improve.  I have spent a total of 65 minutes dedicated to this patient today, preparing to see patient, performing a medically appropriate examination and evaluation, ordering tests and/or medications and procedures, and counseling and educating the patient/family/caregiver; independently interpreting result and communicating results to the family/patient/caregiver; and documenting clinical information in the electronic medical record.   Cassandra Cleveland, MD 04/12/2024, 1:51 PM  [facility] [address], [address] [place], [place] [address] [phone]

## 2024-04-12 NOTE — Patient Instructions (Signed)
 Dementia labs including ATN profile and APoE4 genotype  Referral to formal neuropsychological evaluation  Continue to follow up with PCP  Consider referral to psychiatry  Return as needed    There are well-accepted and sensible ways to reduce risk for Alzheimers disease and other degenerative brain disorders .  Exercise Daily Walk A daily 20 minute walk should be part of your routine. Disease related apathy can be a significant roadblock to exercise and the only way to overcome this is to make it a daily routine and perhaps have a reward at the end (something your loved one loves to eat or drink perhaps) or a personal trainer coming to the home can also be very useful. Most importantly, the patient is much more likely to exercise if the caregiver / spouse does it with him/her. In general a structured, repetitive schedule is best.  General Health: Any diseases which effect your body will effect your brain such as a pneumonia, urinary infection, blood clot, heart attack or stroke. Keep contact with your primary care doctor for regular follow ups.  Sleep. A good nights sleep is healthy for the brain. Seven hours is recommended. If you have insomnia or poor sleep habits we can give you some instructions. If you have sleep apnea wear your mask.  Diet: Eating a heart healthy diet is also a good idea; fish and poultry instead of red meat, nuts (mostly non-peanuts), vegetables, fruits, olive oil or canola oil (instead of butter), minimal salt (use other spices to flavor foods), whole grain rice, bread, cereal and pasta and wine in moderation.Research is now showing that the MIND diet, which is a combination of The Mediterranean diet and the DASH diet, is beneficial for cognitive processing and longevity. Information about this diet can be found in The MIND Diet, a book by Andria Keeler, MS, RDN, and online at [employer]  Finances, Power of [address] and Advance Directives:  You should consider putting legal safeguards in place with regard to financial and medical decision making. While the spouse always has power of attorney for medical and financial issues in the absence of any form, you should consider what you want in case the spouse / caregiver is no longer around or capable of making decisions.

## 2024-04-18 ENCOUNTER — Ambulatory Visit (INDEPENDENT_AMBULATORY_CARE_PROVIDER_SITE_OTHER): Admitting: Clinical

## 2024-04-18 ENCOUNTER — Encounter: Payer: Self-pay | Admitting: Clinical

## 2024-04-18 DIAGNOSIS — F419 Anxiety disorder, unspecified: Secondary | ICD-10-CM

## 2024-04-18 NOTE — Progress Notes (Signed)
 [facility] Counselor/Therapist Progress Note  Patient ID: Carolyn Cordova, MRN: [id number],    Date: 04/18/2024  Time Spent: 2:31pm - 3:32pm : 61 minutes  Treatment Type: Individual Therapy  Reported Symptoms: Patient reported she is forgetful at times  Mental Status Exam: Appearance:  Neat and Well Groomed     Behavior: Appropriate  Motor: Normal  Speech/Language:  Clear and Coherent and Normal Rate  Affect: Appropriate  Mood: normal  Thought process: normal  Thought content:   WNL  Sensory/Perceptual disturbances:   WNL  Orientation: oriented to person, place, time/date, and situation  Attention: Good  Concentration: Good  Memory: WNL  Fund of knowledge:  Good  Insight:   Fair  Judgment:  Good  Impulse Control: Good   Risk Assessment: Danger to Self:  No Patient denied current suicidal ideation  Self-injurious Behavior: No Danger to Others: No Patient denied current homicidal ideation Duty to Warn:no Physical Aggression / Violence:No  Access to Firearms a concern: No  Gang Involvement:No   Subjective: Patient stated, "I think I'm doing better", "its not as bad as it was, I think it's improving" in response to events since last session. Patient stated, "I think it's been pretty good" in response to patient's mood since last session.  Patient stated, "Im forgetful". Patient reported she participates in activities patient enjoys. Patient stated, "I don't feel as sharp as I was but it's not like I'm getting lost". [name] reported she does not feel patient's symptoms are improving. Daughter reported patient had an appointment with a neurologist on Tuesday and neurologist diagnosed patient with pseudodementia. Daughter reported an increase in patient's sleep last week and reported neurologist recommended patient continue therapy and recommended a neuro psych evaluation. Daughter reported patient exhibits deficits in memory, fixation as it relates to  locating items, and repeats stories/conversations. Daughter reported patient exhibits difficulty recalling recently learned information. Patient reported she is aware of some of the changes patient's daughter shared during today's session. Patient stated, "if it will help I will continue", "if that's what's necessary, yes" in response to participation in therapy. Patient stated, "the memory would need to be worked on" in response to goals for therapy. Daughter reported patient experiences difficulty focusing on tasks at times. Patient reported patient withdraws from participating in activities with others.   Interventions: Motivational Interviewing. Clinician conducted session via caregility video from clinician's home office. Patient provided verbal consent to proceed with telehealth session and is aware of limitations of telephone or video visits. Patient participated in session from patient's home. [name] was present during today's session and patient provided verbal consent for daughter to be present and provide collateral information.  Reviewed events since last session and assessed for changes. Provided psycho education related to cognitive changes and normal age related changes. Discussed patient's thoughts/feelings regarding participation in therapy. Clinician utilized motivational interviewing to explore potential goals for therapy. Clinician utilized a task centered approach in collaboration with patient to develop goals for therapy. Patient participated in development of goals and agreed to goals for therapy.     Collaboration of Care: Other not required at this time   Diagnosis:  Anxiety disorder, unspecified type   Plan: Patient is to utilize [employer] Therapy, thought re-framing, behavioral activation, and coping strategies to decrease symptoms associated with their diagnosis. Frequency: bi-weekly  Modality: individual     Long-term goal:   Reduce overall level,  frequency, and intensity of the feelings of anxiety as evidenced by decrease in  forgetfulness, feeling anxious, difficulty focusing, and withdrawal from activities from 7 days/week to 0 to 1 days/week per patient report for at least 3 consecutive months. Target Date: 04/18/24  Progress: established 04/18/24   Short-term goal:  Implement strategies to increase concentration/focus  Target Date: 04/18/24  Progress: established 04/18/24   Develop and implement strategies to increase patient's participation in social activities Target Date: 04/18/24  Progress: established 04/18/24          Burlene Carpen, LCSW

## 2024-04-18 NOTE — Progress Notes (Signed)
   Doree Barthel, LCSW

## 2024-04-21 ENCOUNTER — Other Ambulatory Visit: Payer: Self-pay | Admitting: Family Medicine

## 2024-04-21 DIAGNOSIS — F17200 Nicotine dependence, unspecified, uncomplicated: Secondary | ICD-10-CM

## 2024-04-27 LAB — ATN PROFILE
A -- Beta-amyloid 42/40 Ratio: 0.097 — ABNORMAL LOW (ref >0.102)
Beta-amyloid 40: 182.32 pg/mL
Beta-amyloid 42: 17.64 pg/mL
N -- NfL, Plasma: 1.89 pg/mL (ref 0.00–3.65)
T -- p-tau181: 1.5 pg/mL — ABNORMAL HIGH (ref 0.00–0.97)

## 2024-04-27 LAB — APOE ALZHEIMER'S RISK

## 2024-04-29 ENCOUNTER — Ambulatory Visit (INDEPENDENT_AMBULATORY_CARE_PROVIDER_SITE_OTHER): Admitting: Clinical

## 2024-04-29 ENCOUNTER — Ambulatory Visit: Payer: Self-pay | Admitting: Neurology

## 2024-04-29 DIAGNOSIS — F419 Anxiety disorder, unspecified: Secondary | ICD-10-CM | POA: Diagnosis not present

## 2024-04-29 NOTE — Progress Notes (Signed)
   Doree Barthel, LCSW

## 2024-04-29 NOTE — Progress Notes (Signed)
 [facility] Counselor/Therapist Progress Note  Patient ID: YETUNDE LEIS, MRN: [id number],    Date: 04/29/2024  Time Spent: 12:33pm - 1:21pm : 48 minutes   Treatment Type: Individual Therapy  Reported Symptoms: patient reported forgetting tasks at times  Mental Status Exam: Appearance:  Neat and Well Groomed     Behavior: Appropriate  Motor: Normal  Speech/Language:  Clear and Coherent and Normal Rate  Affect: Appropriate  Mood: normal  Thought process: normal  Thought content:   WNL  Sensory/Perceptual disturbances:   WNL  Orientation: oriented to person, place, time/date, and situation  Attention: Good  Concentration: Good  Memory: WNL  Fund of knowledge:  Good  Insight:   Fair  Judgment:  Good  Impulse Control: Good   Risk Assessment: Danger to Self:  No Patient denied current suicidal ideation  Self-injurious Behavior: No Danger to Others: No Patient denied current homicidal ideation Duty to Warn:no Physical Aggression / Violence:No  Access to Firearms a concern: No  Gang Involvement:No   Subjective: Patient stated, I think it's doing better, I still forget some things, I'm not as forgetful as I was in response to events since last session. Patient stated, not working has helped tremendously, there's no added pressure in reference to contributing factors to recent improvement in memory. Patient reported patient writes appointments, tasks, and reminders on a notepad and patient reported writing down anything I need to know that I might forget. Patient stated, I'm not totally the same as I was before, sharing a living space with daughter/son in law, three dogs in the home, patient's mother focusing on negatives in situations, and patient's brother are current stressors. Patient stated, I think she's (daughter) looking for the momma she had before her daddy died. Patient stated, to me I'm doing good in reference to difference in patient's  perspective and daughter's perspective. Patient stated, she (daughter) knows I will help her, but I'll be more frugal than her father was. Patient stated, when I forget something I don't know if it triggers her, you can tell it bothers her. Patient reported forgetting tasks patient plans to accomplish and recalls task later. Patient stated, I can tell when I get a little antsy I need to back up and look at things.   Interventions: Cognitive Behavioral Therapy. Clinician conducted session via caregility video from clinician's home office. Patient provided verbal consent to proceed with telehealth session and is aware of limitations of telephone or video visits. Patient participated in session from patient's home. Reviewed events since last session and assessed for changes. Explored patient's perspective as it relates to contributing factors to patient's report of recent improvement in memory. Discussed current stressors and the impact on patient's mood. Explored differences in patient's and daughter's perspectives in regard to patient's memory and functioning. Clinician asked clarifying questions to identify differences in patient's memory and functioning currently and prior to husband's death. Explored changes in memory patient has observed. Provided psycho education related to stress and the potential impact on mood. Clinician requested for homework patient complete a thought record.    Collaboration of Care: Other not required at this time   Diagnosis:  Anxiety disorder, unspecified type   Plan: Patient is to utilize [employer] Therapy, thought re-framing, behavioral activation, and coping strategies to decrease symptoms associated with their diagnosis. Frequency: bi-weekly  Modality: individual      Long-term goal:   Reduce overall level, frequency, and intensity of the feelings of anxiety as evidenced by decrease in  forgetfulness, feeling anxious, difficulty focusing, and withdrawal  from activities from 7 days/week to 0 to 1 days/week per patient report for at least 3 consecutive months. Target Date: 04/18/25  Progress: established 04/18/24    Short-term goal:  Implement strategies to increase concentration/focus  Target Date: 04/18/25  Progress: established 04/18/24    Develop and implement strategies to increase patient's participation in social activities Target Date: 04/18/25  Progress: established 04/18/24         Burlene Carpen, LCSW

## 2024-05-02 ENCOUNTER — Telehealth: Payer: Self-pay

## 2024-05-02 NOTE — Telephone Encounter (Signed)
 Call to patient, agreeable to 6/27 appt at 10:45, VV

## 2024-05-13 ENCOUNTER — Encounter: Payer: Self-pay | Admitting: Neurology

## 2024-05-13 ENCOUNTER — Ambulatory Visit (INDEPENDENT_AMBULATORY_CARE_PROVIDER_SITE_OTHER): Admitting: Clinical

## 2024-05-13 ENCOUNTER — Telehealth (INDEPENDENT_AMBULATORY_CARE_PROVIDER_SITE_OTHER): Admitting: Neurology

## 2024-05-13 DIAGNOSIS — G309 Alzheimer's disease, unspecified: Secondary | ICD-10-CM | POA: Diagnosis not present

## 2024-05-13 DIAGNOSIS — F419 Anxiety disorder, unspecified: Secondary | ICD-10-CM | POA: Diagnosis not present

## 2024-05-13 DIAGNOSIS — G3184 Mild cognitive impairment, so stated: Secondary | ICD-10-CM | POA: Diagnosis not present

## 2024-05-13 NOTE — Progress Notes (Signed)
 [facility]  PATIENT: Carolyn Cordova DOB: 19-Aug-1961  REQUESTING CLINICIAN: Tower, Laine LABOR, MD HISTORY FROM: Patient/Daughter  REASON FOR VISIT: Memory loss    HISTORICAL  CHIEF COMPLAINT:  Chief Complaint  Patient presents with   Memory Loss    Follow up    INTERVAL HISTORY 05/13/2024:  Patient was called today for video visit, daughter was present.  Last visit was in May 2025.  At that time, I diagnosed her with mild cognitive impairment most likely related to depression and grief.  However we obtained ATN in ApoE4 genotype for stratification.  Her ATN profile come back positive for Alzheimer disease biomarker, and her ApoE4 genotype showed 1 copy of E4.  Discussed this result with patient, informed her that her memory loss might be related to Alzheimer disease.  Plan for now is to refer her for formal neuropsychological testing and to obtain an amyloid PET scan as patient is interested in the new monoclonal antibodies.  I will contact the after the completion of the PET scan to discuss next steps.  Both patient and daughter are agreeable with plans.    HISTORY OF PRESENT ILLNESS:  This is a 63 year old woman past medical history of hypertension, hyperlipidemia, depression who is presenting with memory complaint.  She is accompanied by her daughter.  Patient tells me that her memory loss started since the death of her husband in 02-Sep-2022.  Memory loss described as cannot remember things that I should remember.  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  She currently lives with her daughter who tells me that her memory has been worse since losing her husband.  Husband was battling liver disease for 2 years prior to passing away and this was 2 months after patient also losing her father-in-law.  She tells me she did not receive any grief counseling but was  later diagnosed with depression and put on Zoloft.  She recently started therapy. Daughter tells me that she is very forgetful, she gets fixating on things, she can forget what she had for dinner 10 minutes after dinner.  Daughter tells me the other day she asked her home address where she lived for more than 30 years and lived in that neighborhood all of her life.  She still drives, denies any recent accident but only drives short distance, she still independent in her ADLs.   TBI:   No past history of TBI Stroke:   no past history of stroke Seizures:   no past history of seizures Sleep:   no history of sleep apnea.  Mood: On Prozac  for depression Family history of Dementia: Denies   Functional status: independent in all ADLs and IADLs Patient lives with daughter. Cooking: use to cook but not anymore, difficulty following direction  Cleaning: no issues Shopping: not much, online orders  Bathing: no issues Toileting: no issues  Driving: yes, but small routes  Bills: auto draft  Medications: with pill box  Ever left the stove on by accident?: yes Forget how to use items around the house?: Denies  Getting lost going to familiar places?: denies  Forgetting loved ones names?: Denies Word finding difficulty? Sometimes  Sleep: Good    OTHER MEDICAL CONDITIONS: Hypertension, Hyperlipidemia, Depression   REVIEW OF SYSTEMS: Full 14 system review of systems performed and negative with exception of: As noted in the HPI   ALLERGIES:  Allergies  Allergen Reactions   Adhesive [Tape]    Amoxicillin-Pot Clavulanate     REACTION: rash   Codeine     REACTION: ? reaction   Fexofenadine     REACTION: nausea   Sertraline Hcl     REACTION: sedation   Varenicline Tartrate     REACTION: made her feel sick and did not work   Wellbutrin [Bupropion] Other (See Comments)    HOME MEDICATIONS: Outpatient Medications Prior to Visit  Medication Sig Dispense Refill   amLODipine  (NORVASC ) 5 MG  tablet Take 1 tablet (5 mg total) by mouth daily. 90 tablet 3   aspirin 81 MG tablet Take 81 mg by mouth daily.     cetirizine (ZYRTEC) 10 MG tablet Take 10 mg by mouth daily.     cevimeline  (EVOXAC ) 30 MG capsule TAKE 1 CAPSULE BY MOUTH 3 TIMES A DAY (Patient taking differently: Take 30 mg by mouth 2 (two) times daily. TAKE 1 CAPSULE BY MOUTH 3 TIMES A DAY) 270 capsule 3   cyanocobalamin  (VITAMIN B12) 1000 MCG tablet Take 1,000 mcg by mouth daily.     ezetimibe  (ZETIA ) 10 MG tablet Take 1 tablet (10 mg total) by mouth daily. 90 tablet 3   FLUoxetine  (PROZAC ) 10 MG capsule Take 1 capsule (10 mg total) by mouth daily. With the 20 mg fluoxetine  capsule 90 capsule 1   FLUoxetine  (PROZAC ) 20 MG capsule Take 1 capsule (20 mg total) by mouth daily. With the 10 mg fluoxetine  capsule 90 capsule 3   rosuvastatin  (CRESTOR ) 5 MG tablet Take 1 tablet (5 mg total) by mouth daily. 30 tablet 1   tamsulosin  (FLOMAX ) 0.4 MG CAPS capsule TAKE 1 CAPSULE BY MOUTH EVERY DAY 90 capsule 3   No facility-administered medications prior to visit.    PAST MEDICAL HISTORY: Past Medical History:  Diagnosis Date   Allergy    seasonal   Carpal tunnel syndrome    Cervical disc disease    GERD (gastroesophageal reflux disease)    Hyperlipidemia    Hypertension    Migraine    PMDD (premenstrual dysphoric disorder)     PAST SURGICAL HISTORY: Past Surgical History:  Procedure Laterality Date   BREAST BIOPSY Left    unknown when, X-clip, benign   CESAREAN SECTION     COLONOSCOPY     CRYOABLATION  09/1989   secondary to dysplasia   EXTRACORPOREAL SHOCK WAVE LITHOTRIPSY Right 10/02/2022   Procedure: EXTRACORPOREAL SHOCK WAVE LITHOTRIPSY (ESWL);  Surgeon: Twylla Glendia BROCKS, MD;  Location: [facility] ORS;  Service: Urology;  Laterality: Right;   EXTRACORPOREAL SHOCK WAVE LITHOTRIPSY Right 10/16/2022   Procedure: EXTRACORPOREAL SHOCK WAVE LITHOTRIPSY (ESWL);  Surgeon: Penne Knee, MD;  Location: [facility] OR[facility];  Service:  Urology;  Laterality: Right;   lipoma removal  2006   PILONIDAL CYST EXCISION  1980s   x2   plantar fasciitis  06/2011   surgery with Dr Verta   POLYPECTOMY     SPINE SURGERY  01/2010   cervical spine surgery    FAMILY HISTORY: Family History  Problem Relation Age of Onset   Hyperlipidemia Mother    Cancer Mother        breast CA   Breast cancer Mother    Cancer Father        adrenal cancer   Colon polyps Father    Hyperlipidemia Father    Breast cancer Maternal Aunt    Breast cancer Paternal Aunt    Breast cancer Maternal Grandmother  Cancer Cousin        breast CA   Hyperlipidemia Brother    Colon cancer Neg Hx    Esophageal cancer Neg Hx    Rectal cancer Neg Hx    Stomach cancer Neg Hx     SOCIAL HISTORY: Social History   Socioeconomic History   Marital status: Widowed    Spouse name: Not on file   Number of children: Not on file   Years of education: Not on file   Highest education level: 12th grade  Occupational History   Not on file  Tobacco Use   Smoking status: Every Day    Current packs/day: 1.00    Types: Cigarettes   Smokeless tobacco: Never  Vaping Use   Vaping status: Never Used  Substance and Sexual Activity   Alcohol use: No    Alcohol/week: 0.0 standard drinks of alcohol   Drug use: No   Sexual activity: Not Currently  Other Topics Concern   Not on file  Social History Narrative   Not on file   Social Drivers of Health   Financial Resource Strain: Low Risk  (12/25/2023)   Overall Financial Resource Strain (CARDIA)    Difficulty of Paying Living Expenses: Not hard at all  Food Insecurity: No Food Insecurity (12/25/2023)   Hunger Vital Sign    Worried About Running Out of Food in the Last Year: Never true    Ran Out of Food in the Last Year: Never true  Transportation Needs: No Transportation Needs (12/25/2023)   PRAPARE - Administrator, Civil Service (Medical): No    Lack of Transportation (Non-Medical): No  Physical  Activity: Insufficiently Active (12/25/2023)   Exercise Vital Sign    Days of Exercise per Week: 5 days    Minutes of Exercise per Session: 20 min  Stress: Stress Concern Present (12/25/2023)   [employer] of Occupational Health - Occupational Stress Questionnaire    Feeling of Stress : To some extent  Social Connections: Moderately Integrated (12/25/2023)   Social Connection and Isolation Panel    Frequency of Communication with Friends and Family: More than three times a week    Frequency of Social Gatherings with Friends and Family: More than three times a week    Attends Religious Services: More than 4 times per year    Active Member of [employer] or Organizations: Yes    Attends Banker Meetings: Not on file    Marital Status: Widowed  Intimate Partner Violence: Not on file     PHYSICAL EXAM   GENERAL EXAM/CONSTITUTIONAL: Vitals:  There were no vitals filed for this visit.  There is no height or weight on file to calculate BMI. Wt Readings from Last 3 Encounters:  04/12/24 151 lb 8 oz (68.7 kg)  04/08/24 152 lb (68.9 kg)  03/21/24 151 lb 6 oz (68.7 kg)   Patient is in no distress; well developed, nourished and groomed; neck is supple  MUSCULOSKELETAL: Gait, strength, tone, movements noted in Neurologic exam below  NEUROLOGIC: MENTAL STATUS:     04/12/2024    1:09 PM  MMSE - Mini Mental State Exam  Orientation to time 4  Orientation to Place 5  Registration 3  Attention/ Calculation 2  Recall 0  Language- name 2 objects 2  Language- repeat 1  Language- follow 3 step command 3  Language- read & follow direction 1  Write a sentence 1  Copy design 0  Total score 22  awake, alert, oriented to person, place and time recent and remote memory intact normal attention and concentration language fluent, comprehension intact, naming intact fund of knowledge appropriate  CRANIAL NERVE:  2nd, 3rd, 4th, 6th- visual fields full to confrontation, extraocular  muscles intact, no nystagmus 5th - facial sensation symmetric 7th - facial strength symmetric 8th - hearing intact   DIAGNOSTIC DATA (LABS, IMAGING, TESTING) - I reviewed patient records, labs, notes, testing and imaging myself where available.  Lab Results  Component Value Date   WBC 6.8 12/22/2023   HGB 16.2 (H) 12/22/2023   HCT 47.7 (H) 12/22/2023   MCV 94.3 12/22/2023   PLT 233.0 12/22/2023      Component Value Date/Time   NA 141 12/22/2023 0747   K 4.3 12/22/2023 0747   CL 105 12/22/2023 0747   CO2 27 12/22/2023 0747   GLUCOSE 100 (H) 12/22/2023 0747   BUN 15 12/22/2023 0747   CREATININE 0.83 12/22/2023 0747   CREATININE 0.67 10/07/2019 1554   CALCIUM  9.3 12/22/2023 0747   PROT 6.5 12/22/2023 0747   ALBUMIN 4.5 12/22/2023 0747   AST 12 12/22/2023 0747   ALT 16 12/22/2023 0747   ALKPHOS 94 12/22/2023 0747   BILITOT 0.6 12/22/2023 0747   GFRNONAA >60 02/22/2022 1401   GFRAA  12/28/2009 1522    >60        The eGFR has been calculated using the MDRD equation. This calculation has not been validated in all clinical situations. eGFR's persistently <60 mL/min signify possible Chronic Kidney Disease.   Lab Results  Component Value Date   CHOL 179 12/22/2023   HDL 52.40 12/22/2023   LDLCALC 108 (H) 12/22/2023   LDLDIRECT 150.0 08/05/2017   TRIG 94.0 12/22/2023   CHOLHDL 3 12/22/2023   Lab Results  Component Value Date   HGBA1C 5.8 10/27/2023   Lab Results  Component Value Date   VITAMINB12 923 (H) 02/24/2024   Lab Results  Component Value Date   TSH 1.68 12/22/2023    Head CT 02/24/2024 Atrophy and chronic small vessel ischemic changes. No acute intracranial process identified.  ApoE genotype E3/E4  ATN Profile consistent with Alzheimer disease biomarkers     ASSESSMENT AND PLAN  63 y.o. year old female with history of hypertension, hyperlipidemia, depression who is presenting for memory loss likely related to Alzheimer's disease.  Informed  patient that her ATN profile was positive for Alzheimer disease biomarker, I still do believe that grief and depression are also affecting her memory.  I will send her for formal neuropsychological testing.  They report interest in the new monoclonal antibodies such as lecanemab for donanemab, plan will be to obtain a PET amyloid to confirm presence of the amyloid plaques.  I will contact the patient after the completion of the PET amyloid for next steps.  I will see them in a year for follow-up or sooner if worse.   1. 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  Orders Placed This Encounter  Procedures   NM PET Brain Amyloid   Ambulatory referral to Neuropsychology    No orders of the defined types were placed in this encounter.   Return in about 1 year (around 05/13/2025).  Virtual Visit via Video Note  I connected with  Memorie Yokoyama Rix on 05/13/24 at 10:45 AM EDT by a video enabled telemedicine application and verified that I am speaking with the correct person using two identifiers.  Location: Patient: home Provider: [facility] Office   I discussed the limitations of evaluation and management by telemedicine and the availability of in person appointments. The patient expressed understanding and agreed to proceed.   I discussed the assessment and treatment plan with the patient. The patient was provided an opportunity to ask questions and all were answered. The patient agreed with the plan and demonstrated an understanding of the instructions.   The patient was advised to call back or seek an in-person evaluation if the symptoms worsen or if the condition fails to improve as anticipated.  I provided 30 minutes of non-face-to-face time during this encounter.   I  personally spent a total of 40 minutes in the care of the patient today including preparing to see the patient, getting/reviewing separately obtained history, performing a medically appropriate exam/evaluation, counseling and educating, placing orders, documenting clinical information in the EHR, communicating results, and coordinating care.   Pastor Falling, MD 05/13/2024, 3:16 PM  [facility] [address] [place], [place] [address] [phone]

## 2024-05-13 NOTE — Patient Instructions (Signed)
 Referral to neuropsychology Amyloid PET scan, I will contact you to go over the results Continue to follow with PCP and psychiatry Return in a year or sooner if worse.

## 2024-05-13 NOTE — Progress Notes (Signed)
 [facility] Counselor/Therapist Progress Note  Patient ID: Carolyn Cordova, MRN: [id number],    Date: 05/13/2024  Time Spent: 2:37pm - 3:30pm : 53 minutes   Treatment Type: Individual Therapy  Reported Symptoms: Patient reported patient is forgetful at times  Mental Status Exam: Appearance:  Neat and Well Groomed     Behavior: Appropriate  Motor: Normal  Speech/Language:  Clear and Coherent and Normal Rate  Affect: Appropriate  Mood: normal  Thought process: normal  Thought content:   WNL  Sensory/Perceptual disturbances:   WNL  Orientation: oriented to person, place, time/date, and situation  Attention: Good  Concentration: Good  Memory: Patient reported patient is forgetful at times  Fund of knowledge:  Good  Insight:   Fair  Judgment:  Good  Impulse Control: Good   Risk Assessment: Danger to Self:  No Patient denied current suicidal ideation  Self-injurious Behavior: No Danger to Others: No Patient denied current homicidal ideation Duty to Warn:no Physical Aggression / Violence:No  Access to Firearms a concern: No  Gang Involvement:No   Subjective: Patient stated, I think I'm doing better, I still forget things in response to events since last session. Patient stated, I think I've been better in response to mood since last session.  [name] reported patient recently had lab work and the lab work indicated patient has the gene and biomarker for alzheimer's disease. [name] reported during patient's recent appointment the neurologist recommended patient follow up with a psychiatrist, PET scan, and neuro psych testing. Patient reported patient's mother has a first cousin with a diagnosis of dementia. Patient stated, I try to reign it in and try to think about it a little more, I write down things, uses sticky notes, and uses a calendar in response to changes in memory. Patient stated, In some aspects I'm more laid back than I ever  thought I would be in response to changes patient has observed within patient's self/personality. Patient reported family dynamics are a stressor at times and patient reported she walks away in response to family dynamics. Patient stated, just getting out of it for a while is helpful to me in reference to family dynamics. Patient reported patient walks around the pond or in the woods to cope with stressors. Patient reported fishing, being in nature, plants, and gardening are relaxing to patient.   Interventions: Cognitive Behavioral Therapy. Clinician conducted session via caregility video from clinician's home office. Patient provided verbal consent to proceed with telehealth session and is aware of limitations of telephone or video visits. Patient participated in session from patient's home. [name] was present during a portion of today's session and patient provided verbal consent for daughter to be present during session. Reviewed events since last session and assessed for changes. Provided psycho education related to alzheimer's disease and assessment/testing process for alzheimer's disease and other dementias. Discussed family history as it relates to dementia. Explored and identified current coping strategies patient utilizes in response to changes in memory and stressors. Discussed family dynamics and the impact on patient.  Explored additional changes patient has observed as it relates to patient's personality. Clinician requested for homework patient complete a thought record.     Collaboration of Care: Other not required at this time   Diagnosis:  Anxiety disorder, unspecified type   Plan: Patient is to utilize [employer] Therapy, thought re-framing, behavioral activation, and coping strategies to decrease symptoms associated with their diagnosis. Frequency: bi-weekly  Modality: individual  Long-term goal:   Reduce overall level, frequency, and intensity of the  feelings of anxiety as evidenced by decrease in forgetfulness, feeling anxious, difficulty focusing, and withdrawal from activities from 7 days/week to 0 to 1 days/week per patient report for at least 3 consecutive months. Target Date: 04/18/25  Progress: progressing    Short-term goal:  Implement strategies to increase concentration/focus  Target Date: 04/18/25  Progress: progressing    Develop and implement strategies to increase patient's participation in social activities Target Date: 04/18/25  Progress: progressing       [name], LCSW

## 2024-05-13 NOTE — Progress Notes (Signed)
   Doree Barthel, LCSW

## 2024-05-16 ENCOUNTER — Telehealth: Payer: Self-pay | Admitting: Neurology

## 2024-05-16 NOTE — Telephone Encounter (Signed)
 Received fax from [facility]  informing that Pt had been scheduled  for an appt on 07-04-2024 at 10am with Dr. Jan Donath .MD

## 2024-05-16 NOTE — Telephone Encounter (Signed)
 Referral for neuropsychology fax to [facility]. Phone: [phone], Fax: [phone].

## 2024-05-19 ENCOUNTER — Telehealth: Payer: Self-pay | Admitting: Neurology

## 2024-05-19 NOTE — Telephone Encounter (Signed)
 Carolyn Cordova: [id number] exp. 05/16/24-11/12/24 sen to [facility] [phone]

## 2024-05-19 NOTE — Telephone Encounter (Signed)
 Denise from [employer] team called to Confirm pt contact information

## 2024-06-01 ENCOUNTER — Ambulatory Visit (INDEPENDENT_AMBULATORY_CARE_PROVIDER_SITE_OTHER): Admitting: Family Medicine

## 2024-06-01 ENCOUNTER — Encounter: Payer: Self-pay | Admitting: Family Medicine

## 2024-06-01 VITALS — BP 128/76 | HR 71 | Temp 98.2°F | Ht 69.0 in | Wt 151.2 lb

## 2024-06-01 DIAGNOSIS — F17200 Nicotine dependence, unspecified, uncomplicated: Secondary | ICD-10-CM | POA: Diagnosis not present

## 2024-06-01 DIAGNOSIS — R413 Other amnesia: Secondary | ICD-10-CM

## 2024-06-01 DIAGNOSIS — F32A Depression, unspecified: Secondary | ICD-10-CM

## 2024-06-01 DIAGNOSIS — F419 Anxiety disorder, unspecified: Secondary | ICD-10-CM | POA: Diagnosis not present

## 2024-06-01 NOTE — Assessment & Plan Note (Signed)
 Pt thinks she is doing some better overall but still deals with the stress of depending and butting heads with her daughter who she lives with , also care of her elderly mother and frustration over losing memory   Doing counseling -hard to tell if helpful due to memory issues  Reviewed last neuro notes- susp of possible alz dementia and testing /PET is in 2 d    Continues fluoxetine  30 mg daily which she tolerated well (except for the fact she dislikes taking medicine) At this time will keep dosing the same

## 2024-06-01 NOTE — Assessment & Plan Note (Addendum)
 Disc in detail risks of smoking and possible outcomes including copd, vascular/ heart disease, cancer , respiratory and sinus infections as well as osteoporosis  Pt voices understanding Not ready to quit  Too stressed Not using nicotine  repl  1/2 to 3/4 ppd most days   Denies cough or breathing problems

## 2024-06-01 NOTE — Assessment & Plan Note (Addendum)
 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

## 2024-06-01 NOTE — Patient Instructions (Addendum)
 Keep your appointment for the PET scan and continue seeing the neurologist for memory problems   Continue current medicines  Keep seeing Darice for counseling   Take care of yourself  Keep a calendar and keep writing things down to help the memory   Keep thinking about quitting smoking When you are ready if you need help let us  know

## 2024-06-01 NOTE — Progress Notes (Signed)
 Subjective:    Patient ID: Carolyn Cordova, female    DOB: [birth year], 63 y.o.   MRN: [id number]  HPI  Wt Readings from Last 3 Encounters:  06/01/24 151 lb 4 oz (68.6 kg)  04/12/24 151 lb 8 oz (68.7 kg)  04/08/24 152 lb (68.9 kg)   22.34 kg/m  Vitals:   06/01/24 0916  BP: 128/76  Pulse: 71  Temp: 98.2 F (36.8 C)  SpO2: 99%   Pt presents for follow up of mood /depression in the setting of short term memory loss  Overall feels better than she did  Understands she will never be the person she was   Last visit increase fluoxetine  from 20 to 30 mg daily - tolerating it well / wishes she could gt off her pills  Voiced frustration over memory issues and dependence on other people  Lives with daughter and 3 dogs  Does not like being dependent on her  Counseling with [name] (due to memory it is hard to remember these visits so ? 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  Planning formal neuro psych testing  Also amyloid PET scan   (to consider myoclonal ab treatment in future) -this is planned on 7/18    Has appointment with neuro psychiatric care center on 8/18 with Dr Sable       06/01/2024    9:23 AM 03/21/2024    2:25 PM 01/27/2024   10:29 AM 12/29/2023    9:39 AM 11/19/2023   10:39 AM  Depression screen PHQ 2/9  Decreased Interest 0 0 0 0 0  Down, Depressed, Hopeless 0 1 0 0 0  PHQ - 2 Score 0 1 0 0 0  Altered sleeping 0 0 0 0 0  Tired, decreased energy 0 0 0 1 1  Change in appetite 0 0 0 0 0  Feeling bad or failure about yourself  1 0 0 0 0  Trouble concentrating 0 0 0 0 0  Moving slowly or fidgety/restless 0 0 0 0 0  Suicidal thoughts 0 0 0 0 0  PHQ-9 Score 1 1 0 1 1  Difficult doing work/chores Somewhat difficult Not difficult at all Not difficult at all Not difficult at all Not difficult at all      06/01/2024    9:23 AM 03/21/2024    2:25 PM 01/27/2024    10:29 AM 12/29/2023    9:39 AM  GAD 7 : Generalized Anxiety Score  Nervous, Anxious, on Edge 1 1 1 1   Control/stop worrying 0 0 0 1  Worry too much - different things 1 0 1 1  Trouble relaxing 1 0 0 0  Restless 1 0 1 0  Easily annoyed or irritable 1 0 0 0  Afraid - awful might happen 0 0 0 0  Total GAD 7 Score 5 1 3 3   Anxiety Difficulty Somewhat difficult Not difficult at all Not difficult at all Somewhat difficult   Also stress of caring for her mother   Still walks around the pond /lot of outdoor time in woods and this is calming      B12 def Lab Results  Component Value Date   VITAMINB12 923 (H) 02/24/2024   Smoking status  Averaging 1/2 to 3/4 ppd  1 pack only if a stressful day  Not ready to quit  Patient Active Problem List   Diagnosis Date Noted   History of kidney stones 01/27/2024   B12 deficiency 10/28/2023   Memory changes 10/27/2023   Anxiety and depression 10/31/2022   Encounter for screening mammogram for breast cancer 09/25/2022   Grief 09/25/2022   Prediabetes 09/06/2021   Colon cancer screening 04/10/2015   Internal hemorrhoid 04/10/2015   Encounter for routine gynecological examination 03/17/2014   Routine general medical examination at a health care facility 03/09/2014   H/O herpes zoster 08/09/2012   Dry mouth 04/20/2012   Elevated rheumatoid factor 04/20/2012   HERNIATED CERVICAL DISC 09/14/2009   Pure hypercholesterolemia 05/10/2009   TOBACCO ABUSE 08/19/2007   CARPAL TUNNEL SYNDROME, BILATERAL 08/19/2007   Essential hypertension 08/19/2007   HEMORRHOIDS 08/19/2007   History of cervical dysplasia 08/19/2007   MIGRAINES, HX OF 08/19/2007   Past Medical History:  Diagnosis Date   Allergy    seasonal   Anxiety    Carpal tunnel syndrome    Cervical disc disease    GERD (gastroesophageal reflux disease)    Hyperlipidemia    Hypertension    Migraine    PMDD (premenstrual dysphoric disorder)    Past Surgical History:   Procedure Laterality Date   BREAST BIOPSY Left    unknown when, X-clip, benign   CESAREAN SECTION     COLONOSCOPY     CRYOABLATION  09/1989   secondary to dysplasia   EXTRACORPOREAL SHOCK WAVE LITHOTRIPSY Right 10/02/2022   Procedure: EXTRACORPOREAL SHOCK WAVE LITHOTRIPSY (ESWL);  Surgeon: Twylla Glendia BROCKS, MD;  Location: [facility];  Service: Urology;  Laterality: Right;   EXTRACORPOREAL SHOCK WAVE LITHOTRIPSY Right 10/16/2022   Procedure: EXTRACORPOREAL SHOCK WAVE LITHOTRIPSY (ESWL);  Surgeon: Penne Knee, MD;  Location: [facility];  Service: Urology;  Laterality: Right;   lipoma removal  2006   PILONIDAL CYST EXCISION  1980s   x2   plantar fasciitis  06/2011   surgery with Dr Verta   POLYPECTOMY     SPINE SURGERY  03/11   cervical spine surgery   Social History   Tobacco Use   Smoking status: Every Day    Current packs/day: 1.00    Types: Cigarettes   Smokeless tobacco: Never  Vaping Use   Vaping status: Never Used  Substance Use Topics   Alcohol use: No    Alcohol/week: 0.0 standard drinks of alcohol   Drug use: No   Family History  Problem Relation Age of Onset   Hyperlipidemia Mother    Cancer Mother        breast CA   Breast cancer Mother    Cancer Father        adrenal cancer   Colon polyps Father    Hyperlipidemia Father    Heart disease Father    Breast cancer Maternal Aunt    Breast cancer Paternal Aunt    Breast cancer Maternal Grandmother    Cancer Cousin        breast CA   Hyperlipidemia Brother    Colon cancer Neg Hx    Esophageal cancer Neg Hx    Rectal cancer Neg Hx    Stomach cancer Neg Hx    Allergies  Allergen Reactions   Adhesive [Tape]    Amoxicillin-Pot Clavulanate     REACTION: rash   Codeine     REACTION: ? reaction   Fexofenadine     REACTION: nausea   Sertraline Hcl     REACTION: sedation   Varenicline Tartrate  REACTION: made her feel sick and did not work   Wellbutrin [Bupropion] Other (See Comments)   Current  Outpatient Medications on File Prior to Visit  Medication Sig Dispense Refill   amLODipine  (NORVASC ) 5 MG tablet Take 1 tablet (5 mg total) by mouth daily. 90 tablet 3   aspirin 81 MG tablet Take 81 mg by mouth daily.     cetirizine (ZYRTEC) 10 MG tablet Take 10 mg by mouth daily.     cevimeline  (EVOXAC ) 30 MG capsule TAKE 1 CAPSULE BY MOUTH 3 TIMES A DAY (Patient taking differently: Take 30 mg by mouth 2 (two) times daily. TAKE 1 CAPSULE BY MOUTH 3 TIMES A DAY) 270 capsule 3   cyanocobalamin  (VITAMIN B12) 1000 MCG tablet Take 1,000 mcg by mouth daily.     ezetimibe  (ZETIA ) 10 MG tablet Take 1 tablet (10 mg total) by mouth daily. 90 tablet 3   FLUoxetine  (PROZAC ) 10 MG capsule Take 1 capsule (10 mg total) by mouth daily. With the 20 mg fluoxetine  capsule 90 capsule 1   FLUoxetine  (PROZAC ) 20 MG capsule Take 1 capsule (20 mg total) by mouth daily. With the 10 mg fluoxetine  capsule 90 capsule 3   rosuvastatin  (CRESTOR ) 5 MG tablet Take 1 tablet (5 mg total) by mouth daily. 30 tablet 1   No current facility-administered medications on file prior to visit.    Review of Systems  Constitutional:  Negative for activity change, appetite change, fatigue, fever and unexpected weight change.  HENT:  Negative for congestion, ear pain, rhinorrhea, sinus pressure and sore throat.   Eyes:  Negative for pain, redness and visual disturbance.  Respiratory:  Negative for cough, shortness of breath and wheezing.   Cardiovascular:  Negative for chest pain and palpitations.  Gastrointestinal:  Negative for abdominal pain, blood in stool, constipation and diarrhea.  Endocrine: Negative for polydipsia and polyuria.  Genitourinary:  Negative for dysuria, frequency and urgency.  Musculoskeletal:  Negative for arthralgias, back pain and myalgias.  Skin:  Negative for pallor and rash.  Allergic/Immunologic: Negative for environmental allergies.  Neurological:  Negative for dizziness, syncope and headaches.   Hematological:  Negative for adenopathy. Does not bruise/bleed easily.  Psychiatric/Behavioral:  Positive for decreased concentration and dysphoric mood. Negative for confusion. The patient is nervous/anxious.        Objective:   Physical Exam Constitutional:      General: She is not in acute distress.    Appearance: Normal appearance. She is well-developed and normal weight. She is not ill-appearing or diaphoretic.  HENT:     Head: Normocephalic and atraumatic.  Eyes:     Conjunctiva/sclera: Conjunctivae normal.     Pupils: Pupils are equal, round, and reactive to light.  Neck:     Thyroid : No thyromegaly.     Vascular: No carotid bruit or JVD.  Cardiovascular:     Rate and Rhythm: Normal rate and regular rhythm.     Heart sounds: Normal heart sounds.     No gallop.  Pulmonary:     Effort: Pulmonary effort is normal. No respiratory distress.     Breath sounds: Normal breath sounds. No stridor. No wheezing or rales.     Comments: Diffusely distant bs  Abdominal:     General: There is no distension or abdominal bruit.     Palpations: Abdomen is soft.  Musculoskeletal:     Cervical back: Normal range of motion and neck supple.     Right lower leg: No edema.  Left lower leg: No edema.  Lymphadenopathy:     Cervical: No cervical adenopathy.  Skin:    General: Skin is warm and dry.     Coloration: Skin is not pale.     Findings: No bruising or rash.  Neurological:     Mental Status: She is alert.     Coordination: Coordination normal.     Deep Tendon Reflexes: Reflexes are normal and symmetric. Reflexes normal.  Psychiatric:        Mood and Affect: Mood normal. Affect is blunt.        Speech: Speech normal.        Behavior: Behavior normal.        Cognition and Memory: Memory is impaired. She exhibits impaired recent memory.     Comments: Repeats herself frequently   Candidly discusses symptoms and stressors  (primarily issues living with daughter and frustration  about her memory)            Assessment & Plan:   Problem List Items Addressed This Visit       Other   TOBACCO ABUSE   Disc in detail risks of smoking and possible outcomes including copd, vascular/ heart disease, cancer , respiratory and sinus infections as well as osteoporosis  Pt voices understanding Not ready to quit  Too stressed Not using nicotine  repl  1/2 to 3/4 ppd most days   Denies cough or breathing problems       Memory changes   Seeing neurology  Labs (ATN prof) came back positive for alzheimers bio marker , and 1 cope yf apo E4 gene Has PET scan upcoming on 7/18  Then may consider myoclonal ab testing  Also appointment with neuro psych care center on 8/18 with Dr Sable         Anxiety and depression - Primary   Pt thinks she is doing some better overall but still deals with the stress of depending and butting heads with her daughter who she lives with , also care of her elderly mother and frustration over losing memory   Doing counseling -hard to tell if helpful due to memory issues  Reviewed last neuro notes- susp of possible alz dementia and testing /PET is in 2 d    Continues fluoxetine  30 mg daily which she tolerated well (except for the fact she dislikes taking medicine) At this time will keep dosing the same

## 2024-06-03 ENCOUNTER — Encounter (HOSPITAL_COMMUNITY)
Admission: RE | Admit: 2024-06-03 | Discharge: 2024-06-03 | Disposition: A | Discharge status: Home / Self Care | Source: Ambulatory Visit | Attending: Neurology | Admitting: Neurology

## 2024-06-03 DIAGNOSIS — G3184 Mild cognitive impairment, so stated: Secondary | ICD-10-CM | POA: Insufficient documentation

## 2024-06-03 DIAGNOSIS — G309 Alzheimer's disease, unspecified: Secondary | ICD-10-CM | POA: Diagnosis not present

## 2024-06-03 MED ORDER — FLORBETAPIR F 18 500-1900 MBQ/ML IV SOLN
10.6000 | Freq: Once | INTRAVENOUS | Status: AC
  Administered 2024-06-03: 10.6 via INTRAVENOUS

## 2024-06-13 ENCOUNTER — Ambulatory Visit (INDEPENDENT_AMBULATORY_CARE_PROVIDER_SITE_OTHER): Admitting: Clinical

## 2024-06-13 DIAGNOSIS — F419 Anxiety disorder, unspecified: Secondary | ICD-10-CM

## 2024-06-13 NOTE — Progress Notes (Signed)
   Carolyn Barthel, LCSW

## 2024-06-13 NOTE — Progress Notes (Signed)
 [facility] Counselor/Therapist Progress Note  Patient ID: Carolyn Cordova, MRN: [id number],    Date: 06/13/2024  Time Spent: 9:35am - 10:21am : 46 minutes   Treatment Type: Individual Therapy  Reported Symptoms: Patient reported decline in memory and reported frustration at times  Mental Status Exam: Appearance:  Neat and Well Groomed     Behavior: Appropriate  Motor: Normal  Speech/Language:  Clear and Coherent and Normal Rate  Affect: Appropriate  Mood: normal  Thought process: normal  Thought content:   WNL  Sensory/Perceptual disturbances:   WNL  Orientation: oriented to person, place, time/date, and situation  Attention: Good  Concentration: Good  Memory: Patient reported decline in memory  Fund of knowledge:  Good  Insight:   Fair  Judgment:  Good  Impulse Control: Good   Risk Assessment: Danger to Self:  No Patient denied current suicidal ideation  Self-injurious Behavior: No Danger to Others: No Patient denied current homicidal ideation Duty to Warn:no Physical Aggression / Violence:No  Access to Firearms a concern: No  Gang Involvement:No   Subjective: Patient stated, I think I'm doing better and stated, some times I feel like I get on her (daughter) nerves. Patient stated, if I forget something, I think it's because I'm not the way I use to be. Patient stated, my memory is not good, I don't remember what I went to the doctor for but I remember I went to the doctor. Patient stated, I feel ok in response to patient's current mood. Patient reported feeling frustrated at times when interacting with patient's daughter. Patient reported patient goes to patient's room, [place] patient[place], takes a walk, performs crafts, watches television, and use patient's computer when feeling frustrated. Patient stated, I understand her (daughter's) frustration, but it's not like I'm forgetting on purpose. Patient reported forgetting  appointments, difficulty performing tasks, difficulty completing tasks. Patient stated,its very frustrating in response to changes in memory and stated, it is what it is in reference to assessment process.  Patient stated, you just kind of adapt to whatever works at the time in reference to coping strategies patient utilizes in response to changes in memory. Patient reported patient uses reminders, such as, writing notes, calendar. Patient stated, I'm not going to be the same as I was to begin with in reference to changes in memory. Patient stated, I've just come to the conclusion that this is the way its going to be and I'm not going to remember as well. Patient reported patient assists with church events, and visits patient's mother in response to socialization. Patient stated, I forgot it in reference to patient's thought record.   Interventions: Cognitive Behavioral Therapy. Clinician conducted session via caregility video from clinician's home office. Patient provided verbal consent to proceed with telehealth session and is aware of limitations of telephone or video visits. Patient participated in session from patient's home. Reviewed events since last session and assessed for changes.  Explored and identifying coping strategies patient utilizes in response to frustration. Discussed changes in memory patient has observed and the impact on patient's relationship with daughter. Processed patient's thoughts/feelings related to changes in memory. Provided psycho education related to brain health, physical activity, and socialization. Explored patient's current social activities. Provided psycho education related to completing a thought record. Clinician requested for homework patient complete a thought record.    Collaboration of Care: Other not required at this time   Diagnosis:  Anxiety disorder, unspecified type   Plan: Patient is  to utilize [employer] Therapy, thought re-framing,  behavioral activation, and coping strategies to decrease symptoms associated with their diagnosis. Frequency: bi-weekly  Modality: individual      Long-term goal:   Reduce overall level, frequency, and intensity of the feelings of anxiety as evidenced by decrease in forgetfulness, feeling anxious, difficulty focusing, and withdrawal from activities from 7 days/week to 0 to 1 days/week per patient report for at least 3 consecutive months. Target Date: 04/18/25  Progress: progressing    Short-term goal:  Implement strategies to increase concentration/focus  Target Date: 04/18/25  Progress: progressing    Develop and implement strategies to increase patient's participation in social activities Target Date: 04/18/25  Progress: progressing       [name], LCSW

## 2024-06-15 ENCOUNTER — Ambulatory Visit: Payer: Self-pay | Admitting: Neurology

## 2024-06-15 MED ORDER — DONEPEZIL HCL 5 MG PO TABS: 5.0000 mg | ORAL_TABLET | Freq: Every day | ORAL | 6 refills | Status: AC

## 2024-06-27 ENCOUNTER — Ambulatory Visit (INDEPENDENT_AMBULATORY_CARE_PROVIDER_SITE_OTHER): Admitting: Clinical

## 2024-06-27 DIAGNOSIS — F419 Anxiety disorder, unspecified: Secondary | ICD-10-CM | POA: Diagnosis not present

## 2024-06-27 NOTE — Progress Notes (Signed)
 [facility] Counselor/Therapist Progress Note  Patient ID: Carolyn Cordova, MRN: [id number],    Date: 06/27/2024  Time Spent: 9:35am - 10:21am : 46 minutes   Treatment Type: Individual Therapy  Reported Symptoms: Patient reported feeling overwhelmed at times   Mental Status Exam: Appearance:  Neat and Well Groomed     Behavior: Appropriate  Motor: Normal  Speech/Language:  Clear and Coherent and Normal Rate  Affect: Appropriate  Mood: normal  Thought process: normal  Thought content:   WNL  Sensory/Perceptual disturbances:   WNL  Orientation: oriented to person, place, time/date, and situation  Attention: Good  Concentration: Good  Memory: Patient reported memory impairment  Fund of knowledge:  Good  Insight:   Fair  Judgment:  Good  Impulse Control: Good   Risk Assessment: Danger to Self:  No Patient denied current suicidal ideation  Self-injurious Behavior: No Danger to Others: No Patient denied current homicidal ideation Duty to Warn:no Physical Aggression / Violence:No  Access to Firearms a concern: No  Gang Involvement:No   Subjective: Patient stated, its been a little stressful around here. Patient reported patient's brother is currently hospitalized in [place] for burns and patient stated, that contributes to the stress a little bit. Patient stated, Its got it's highs and it's lows in reference to current stressors. Patient reported patient's daughter and niece have been providing transportation for patient and patient's mother to visit patient's brother in the hospital.  Patient reported patient is trying to support patient's mother in response to brother's injury. Patient reported patient/family are providing care for brother's significant other as well. Patient stated, it's overwhelming in response to impact of stressors on patient's mood. Patient stated, I do what Ive got to do and decompress when I get home in response to coping  strategies. Patient reported patient's husband was at the hospital in [facility] prior to his death. Patient stated, It doesn't really traumatize me but its not the place I choose to go in reference to the hospital. Patient stated, if its something I can't change you just deal with it the best way you can. Patient stated, I may forget some in reference to memory.   Interventions: Cognitive Behavioral Therapy. Clinician conducted session via caregility video from clinician's home office. Patient provided verbal consent to proceed with telehealth session and is aware of limitations of telephone or video visits. Patient participated in session from patient's home. Reviewed events since last session and assessed for changes. Provided supportive therapy, active listening, and validation as patient discussed brother's injury and patient's response. Explored the  impact of current stressors on patient's mood. Explored coping strategies patient has utilized in response to stressors. Explored impact of stress on memory. Provided psycho education related to deep breathing exercise of 4-4-6 and the use of imagery. Clinician requested for homework patient complete a thought record.     Collaboration of Care: Other not required at this time   Diagnosis:  Anxiety disorder, unspecified type   Plan: Patient is to utilize [employer] Therapy, thought re-framing, behavioral activation, and coping strategies to decrease symptoms associated with their diagnosis. Frequency: bi-weekly  Modality: individual      Long-term goal:   Reduce overall level, frequency, and intensity of the feelings of anxiety as evidenced by decrease in forgetfulness, feeling anxious, difficulty focusing, and withdrawal from activities from 7 days/week to 0 to 1 days/week per patient report for at least 3 consecutive months. Target Date: 04/18/25  Progress: progressing  Short-term goal:  Implement strategies to increase  concentration/focus  Target Date: 04/18/25  Progress: progressing    Develop and implement strategies to increase patient's participation in social activities Target Date: 04/18/25  Progress: progressing     [name], LCSW

## 2024-06-27 NOTE — Progress Notes (Signed)
   [name], LCSW

## 2024-07-04 DIAGNOSIS — F17210 Nicotine dependence, cigarettes, uncomplicated: Secondary | ICD-10-CM | POA: Diagnosis not present

## 2024-07-04 DIAGNOSIS — F331 Major depressive disorder, recurrent, moderate: Secondary | ICD-10-CM | POA: Diagnosis not present

## 2024-07-19 ENCOUNTER — Ambulatory Visit (INDEPENDENT_AMBULATORY_CARE_PROVIDER_SITE_OTHER): Admitting: Clinical

## 2024-07-19 DIAGNOSIS — F419 Anxiety disorder, unspecified: Secondary | ICD-10-CM

## 2024-07-19 NOTE — Progress Notes (Signed)
   [name], LCSW

## 2024-07-19 NOTE — Progress Notes (Signed)
 [facility] Counselor/Therapist Progress Note  Patient ID: Carolyn Cordova, MRN: [id number],    Date: 07/19/2024  Time Spent: 9:36am - 10:23am : 47 minutes   Treatment Type: Individual Therapy  Reported Symptoms: Patient reported changes in memory  Mental Status Exam: Appearance:  Neat and Well Groomed     Behavior: Appropriate  Motor: Normal  Speech/Language:  Clear and Coherent and Normal Rate  Affect: Appropriate  Mood: normal  Thought process: normal  Thought content:   WNL  Sensory/Perceptual disturbances:   WNL  Orientation: oriented to person, place, time/date, and situation  Attention: Good  Concentration: Good  Memory: Patient stated, I'm still forgetful.   Fund of knowledge:  Good  Insight:   Fair  Judgment:  Good  Impulse Control: Good   Risk Assessment: Danger to Self:  No Patient denied current suicidal ideation  Self-injurious Behavior: No Danger to Others: No Patient denied current homicidal ideation Duty to Warn:no Physical Aggression / Violence:No  Access to Firearms a concern: No  Gang Involvement:No   Subjective: Patient stated, it has it's good days and it's bad days in response to events since last session. Patient stated, I'm still forgetful. [name] reported patient has received a diagnosis of Alzheimer's Disease and reported patient is in the early stage of the disease. [name] reported patient's medical providers recommended patient continue therapy and prescribed donepezil  for treatment. [name] reported patient was missing meals and not staying hydrated. Patient reported patient's neurologist, Dr. Amadou Camara increased patient's fluoxetine  dosage. Patient stated, I've just gotten to the point where I go along with the flow, try not to stress over things, when it rains it pours in response to recent stressors. Patient stated, I think I'm doing pretty good in response to mood since last  session and patient reported participating in activities patient enjoys. Patient stated, Its a lot of stuff at one time in response to diagnosis. Patient stated, I don't really have a whole lot of choice but to except it and stated, it's in God's plan in response to diagnosis of Alzheimer's Disease. Patient stated, its stressful, that I can't remember things. Patient stated, I feel bad because they (daughters) deserve a life, they deserve not to be a caretaker. Patient stated, I don't remember if I thought to do it in response to completing thought record.   Interventions: Cognitive Behavioral Therapy. Clinician conducted session via caregility video from clinician's office at [facility]. Patient provided verbal consent to proceed with telehealth session and is aware of limitations of telephone or video visits. Patient participated in session from patient's home. Patient's two daughters were present at the beginning of today's session and patient provided verbal consent for daughters to be present and provide information during session. Gathered collateral information from patient/patient's daughters regarding recent appointments/evaluation, recommendations, and diagnosis. Reviewed events since last session and assessed for changes. Explored patient's thoughts/feelings in response to diagnosis of Alzheimer's Disease. Reviewed patient's homework. Clinician requested for homework patient complete a thought record.    Collaboration of Care: Other not required at this time   Diagnosis:  Anxiety disorder, unspecified type   Plan: Patient is to utilize [employer] Therapy, thought re-framing, behavioral activation, and coping strategies to decrease symptoms associated with their diagnosis. Frequency: bi-weekly  Modality: individual      Long-term goal:   Reduce overall level, frequency, and intensity of the feelings of anxiety as evidenced by decrease in forgetfulness, feeling  anxious, difficulty focusing, and withdrawal  from activities from 7 days/week to 0 to 1 days/week per patient report for at least 3 consecutive months. Target Date: 04/18/25  Progress: progressing    Short-term goal:  Implement strategies to increase concentration/focus  Target Date: 04/18/25  Progress: progressing    Develop and implement strategies to increase patient's participation in social activities Target Date: 04/18/25  Progress: progressing     [name], LCSW

## 2024-08-01 ENCOUNTER — Ambulatory Visit (INDEPENDENT_AMBULATORY_CARE_PROVIDER_SITE_OTHER): Admitting: Clinical

## 2024-08-01 DIAGNOSIS — F419 Anxiety disorder, unspecified: Secondary | ICD-10-CM | POA: Diagnosis not present

## 2024-08-01 NOTE — Progress Notes (Signed)
 [facility] Counselor/Therapist Progress Note  Patient ID: Carolyn Cordova, MRN: [id number],    Date: 08/01/2024  Time Spent: 1:37pm - 2:24pm : 47 minutes   Treatment Type: Individual Therapy  Reported Symptoms: memory impairment  Mental Status Exam: Appearance:  Neat and Well Groomed     Behavior: Appropriate  Motor: Normal  Speech/Language:  Clear and Coherent and Normal Rate  Affect: Appropriate  Mood: normal  Thought process: normal  Thought content:   WNL  Sensory/Perceptual disturbances:   WNL  Orientation: oriented to person, place, time/date, and situation  Attention: Good  Concentration: Good  Memory: Memory impairment  Fund of knowledge:  Good  Insight:   Good  Judgment:  Good  Impulse Control: Good   Risk Assessment: Danger to Self:  No Patient denied current suicidal ideation  Self-injurious Behavior: No Danger to Others: No Patient denied current homicidal ideation Duty to Warn:no Physical Aggression / Violence:No  Access to Firearms a concern: No  Gang Involvement:No   Subjective: Patient stated, they're going in response to events since last session. Patient stated, I'm forgetful, I'm here but the one she grew up with is not in reference to daughter's perception of patient. Patient stated, I think that can cause some communication issues. Patient stated, she (daughter) doesn't agree with everything I do. Patient stated, I know I have off days, I know that can be difficult if I ask the same question a couple of times. Patient stated, she's (daughter) upset I'm still smoking and stated, I have cut down tremendously. Patient stated, its a crutch that's been since I was 63 years old in reference to tobacco use. Patient reported patient has utilized tobacco as a coping mechanism.  Patient reported patient feels patient's mother treats patient's brother differently. Patient stated, he's (brother) going to get better, my brains not  going to get any better. Patient stated, Id be lying if I didn't say it didn't bother me or hurt my feelings in reference to mother's response to brother and patient. Patient reported patient has not shared with mother recent diagnosis. Patient stated, I feel like it's better to leave her out of it in reference to diagnosis. Patient stated, mine is not something you can see in reference to diagnosis of Alzheimer's Disease. Patient stated,  I don't think it would change anything in reference to disclosing patient's diagnosis to mother. Patient stated, It would be something that I would want to do immediately but maybe later on in reference to seeking support group or other support opportunities. Patient stated, I'm sitting on the porch, I'm perfectly content  in reference to current mood.  Interventions: Cognitive Behavioral Therapy. Clinician conducted session via caregility video from clinician's home office. Patient provided verbal consent to proceed with telehealth session and is aware of limitations of telephone or video visits. Patient participated in session from patient's home. Reviewed events since last session and assessed for changes. Explored changes as it relates to communication with daughter. Discussed tobacco use. Provided psycho education related to alzheimer's disease, resources, and supports. Provided supportive therapy, active listening, and validation as patient discussed relationship with mother and disclosure of diagnosis. Clinician requested for homework patient complete a thought record.   Collaboration of Care: Other not required at this time   Diagnosis:  Anxiety disorder, unspecified type   Plan: Patient is to utilize [employer] Therapy, thought re-framing, behavioral activation, and coping strategies to decrease symptoms associated with their diagnosis. Frequency: bi-weekly  Modality: individual  Long-term goal:   Reduce overall level, frequency,  and intensity of the feelings of anxiety as evidenced by decrease in forgetfulness, feeling anxious, difficulty focusing, and withdrawal from activities from 7 days/week to 0 to 1 days/week per patient report for at least 3 consecutive months. Target Date: 04/18/25  Progress: progressing    Short-term goal:  Implement strategies to increase concentration/focus  Target Date: 04/18/25  Progress: progressing    Develop and implement strategies to increase patient's participation in social activities Target Date: 04/18/25  Progress: progressing     [name], LCSW

## 2024-08-01 NOTE — Progress Notes (Signed)
   [name], LCSW

## 2024-08-02 DIAGNOSIS — F17210 Nicotine dependence, cigarettes, uncomplicated: Secondary | ICD-10-CM | POA: Diagnosis not present

## 2024-08-02 DIAGNOSIS — F331 Major depressive disorder, recurrent, moderate: Secondary | ICD-10-CM | POA: Diagnosis not present

## 2024-08-02 DIAGNOSIS — F0670 Mild neurocognitive disorder due to known physiological condition without behavioral disturbance: Secondary | ICD-10-CM | POA: Diagnosis not present

## 2024-08-26 ENCOUNTER — Ambulatory Visit (INDEPENDENT_AMBULATORY_CARE_PROVIDER_SITE_OTHER): Admitting: Clinical

## 2024-08-26 DIAGNOSIS — F419 Anxiety disorder, unspecified: Secondary | ICD-10-CM

## 2024-08-26 NOTE — Progress Notes (Signed)
   [name], LCSW

## 2024-08-26 NOTE — Progress Notes (Signed)
 [facility] Counselor/Therapist Progress Note  Patient ID: Carolyn Cordova, MRN: [id number],    Date: 08/26/2024  Time Spent: 9:35am - 10:22am : 47 minutes   Treatment Type: Individual Therapy  Reported Symptoms: patient reported memory impairment  Mental Status Exam: Appearance:  Neat and Well Groomed     Behavior: Appropriate  Motor: Normal  Speech/Language:  Clear and Coherent and Normal Rate  Affect: Appropriate  Mood: normal  Thought process: normal  Thought content:   WNL  Sensory/Perceptual disturbances:   WNL  Orientation: oriented to person, place, and situation  Attention: Good  Concentration: Good  Memory: Memory impairment  Fund of knowledge:  Good  Insight:   Good  Judgment:  Good  Impulse Control: Good   Risk Assessment: Danger to Self:  No Patient denied current suicidal ideation  Self-injurious Behavior: No Danger to Others: No Patient denied current homicidal ideation Duty to Warn:no Physical Aggression / Violence:No  Access to Firearms a concern: No  Gang Involvement:No   Subjective: Patient stated, its going ok, my daughter's still living with me in response to events since last session. Patient stated, ok in response to mood since last session. Patient reported patient continues to walk around patient's pond for exercise and visits with patient's neighbors. Patient reported patient's daughter does not want patient to drive long distances without someone accompanying patient. Patient stated,  I got turned around going to a hair appointment in [place]. Patient reported patient has been going to the hair salon for 20 years. Patient stated, I think the frustration is with myself because my thought process is not what ist use to be is annoying. Patient reported patient has agreed to not drive any further than [place] and will let daughter know where patient is going. Patient stated, that particular time I went blank in  reference to traveling to hair appointment. Patient reported patient's children track patient's phone and patient has GPS on patient's phone. Patient stated, my thought process is not as quick as it use to be and it aggravates me that its not. Patient stated, I think I forgot about my thought record. Patient reported recent conversation with mother regarding patient's brother triggered the following thoughts: I'm always going to be at the bottom of the list, I'm not the priority as it relates to relationship with mother. Patient reported patient has intentionally treated patient's children equally due to patient's experiences and relationship with patient's mother.   Interventions: Cognitive Behavioral Therapy. Clinician conducted session via caregility video from clinician's home office. Patient provided verbal consent to proceed with telehealth session and is aware of limitations of telephone or video visits. Patient participated in session from patient's home. Reviewed events since last session and assessed for changes. Reviewed patient's participation in positive activities since last session. Discussed changes as it relates to patient driving and daughter's concerns related to patient driving. Explored family's motivation for implementing driving restrictions. Explored patient's thoughts/feelings in response to driving restrictions. Reviewed patient's homework assignment and assisted patient in completing thought record during session. Explored and identified thoughts triggered by interaction with mother and the impact on patient's behaviors. Clinician requested for homework patient complete a thought record.     Collaboration of Care: Other not required at this time   Diagnosis:  Anxiety disorder, unspecified type   Plan: Patient is to utilize [employer] Therapy, thought re-framing, behavioral activation, and coping strategies to decrease symptoms associated with their  diagnosis. Frequency: bi-weekly  Modality: individual  Long-term goal:   Reduce overall level, frequency, and intensity of the feelings of anxiety as evidenced by decrease in forgetfulness, feeling anxious, difficulty focusing, and withdrawal from activities from 7 days/week to 0 to 1 days/week per patient report for at least 3 consecutive months. Target Date: 04/18/25  Progress: progressing    Short-term goal:  Implement strategies to increase concentration/focus  Target Date: 04/18/25  Progress: progressing    Develop and implement strategies to increase patient's participation in social activities Target Date: 04/18/25  Progress: progressing       [name], LCSW

## 2024-09-19 ENCOUNTER — Ambulatory Visit (INDEPENDENT_AMBULATORY_CARE_PROVIDER_SITE_OTHER): Admitting: Clinical

## 2024-09-19 DIAGNOSIS — F419 Anxiety disorder, unspecified: Secondary | ICD-10-CM | POA: Diagnosis not present

## 2024-09-19 NOTE — Progress Notes (Signed)
   [name], LCSW

## 2024-09-19 NOTE — Progress Notes (Signed)
 [facility] Counselor/Therapist Progress Note  Patient ID: Carolyn Cordova, MRN: [id number],    Date: 09/19/2024  Time Spent: 9:34am - 10:17am : 43 minutes   Treatment Type: Individual Therapy  Reported Symptoms: Patient reported memory impairment  Mental Status Exam: Appearance:  Neat and Well Groomed     Behavior: Appropriate  Motor: Normal  Speech/Language:  Clear and Coherent and Normal Rate  Affect: Appropriate  Mood: normal  Thought process: normal  Thought content:   WNL  Sensory/Perceptual disturbances:   WNL  Orientation: oriented to person, place, time/date, and situation  Attention: Good  Concentration: Good  Memory: Patient reported memory impairment  Fund of knowledge:  Good  Insight:   Good  Judgment:  Good  Impulse Control: Good   Risk Assessment: Danger to Self:  No Patient denied current suicidal ideation  Self-injurious Behavior: No  Danger to Others: No Patient denied current homicidal ideation Duty to Warn:no Physical Aggression / Violence:No  Access to Firearms a concern: No  Gang Involvement:No   Subjective: Patient stated, ok in response to events since last session. Patient reported forgetfulness and stated, that gets on my nerves sometimes. Patient reported I have somewhat of a routine, its just a change in the routine. Patient reported the following daily routine: eats breakfast, straightens patient's house, goes for a walk, completes household chores, watches television, eats dinner with daughter and daughter's significant other, and visits patient's mother at times. Patient stated, usually I try to stay busy in reference to patient's daily routine. Patient stated, I get a little grumpy every once in a while because I feel over mothered, I get a little cranky. Patient reported usually I'll get quiet or say I'm going to go for a walk and patient stated, you (patient) do something you enjoy and that gets rid of the  negative. Patient reported patient has not shared patient's feelings with daughter when feeling over mothered. Patient stated, I'm feeling pretty good in response to current mood.   Interventions: Cognitive Behavioral Therapy. Clinician conducted session via caregility video from clinician's home office. Patient provided verbal consent to proceed with telehealth session and is aware of limitations of telephone or video visits. Patient participated in session from patient's home. Reviewed events since last session and assessed for changes. Reviewed patient's current routine and changes in patient's routine. Explored patient's thoughts and response when feeling over mothered. Provided psycho education related to mindfulness and mindfulness walking exercise. Clinician will review thought record next session.     Collaboration of Care: Other not required at this time   Diagnosis:  Anxiety disorder, unspecified type   Plan: Patient is to utilize [employer] Therapy, thought re-framing, behavioral activation, and coping strategies to decrease symptoms associated with their diagnosis. Frequency: bi-weekly  Modality: individual      Long-term goal:   Reduce overall level, frequency, and intensity of the feelings of anxiety as evidenced by decrease in forgetfulness, feeling anxious, difficulty focusing, and withdrawal from activities from 7 days/week to 0 to 1 days/week per patient report for at least 3 consecutive months. Target Date: 04/18/25  Progress: progressing    Short-term goal:  Implement strategies to increase concentration/focus  Target Date: 04/18/25  Progress: progressing    Develop and implement strategies to increase patient's participation in social activities Target Date: 04/18/25  Progress: progressing       [name], LCSW

## 2024-09-27 DIAGNOSIS — F331 Major depressive disorder, recurrent, moderate: Secondary | ICD-10-CM | POA: Diagnosis not present

## 2024-10-17 ENCOUNTER — Ambulatory Visit: Admitting: Clinical

## 2024-10-17 DIAGNOSIS — F419 Anxiety disorder, unspecified: Secondary | ICD-10-CM | POA: Diagnosis not present

## 2024-10-17 NOTE — Progress Notes (Signed)
   [name], LCSW

## 2024-10-17 NOTE — Progress Notes (Signed)
 [facility] Counselor/Therapist Progress Note  Patient ID: Carolyn Cordova, MRN: [id number],    Date: 10/17/2024  Time Spent: 10:34am - 11:15am : 41 minutes   Treatment Type: Individual Therapy  Reported Symptoms: memory loss  Mental Status Exam: Appearance:  Neat and Well Groomed     Behavior: Appropriate  Motor: Normal  Speech/Language:  Clear and Coherent and Normal Rate  Affect: Appropriate  Mood: normal  Thought process: normal  Thought content:   WNL  Sensory/Perceptual disturbances:   WNL  Orientation: oriented to person, place, time/date, and situation  Attention: Good  Concentration: Good  Memory: WNL  Fund of knowledge:  Good  Insight:   Good  Judgment:  Good  Impulse Control: Good   Risk Assessment: Danger to Self:  No Patient denied current suicidal ideation  Self-injurious Behavior: No Danger to Others: No Patient denied current homicidal ideation Duty to Warn:no Physical Aggression / Violence:No  Access to Firearms a concern: No  Gang Involvement:No   Subjective: Patient stated, it was nice in reference to recent holiday trip. Patient stated, the girls (daughters) are making sure I'm well taken care of in reference to recent trip. Patient reported no changes since last session. Patient stated, my personality and my memory thing just don't go together real well. Patient stated, It could be a whole lot worse, I'm very grateful for what I have. Patient stated, I'm good in response to mood and stated, I go with the flow now. Patient stated, Its fine in response to current mood. Patient stated,  I just go for a walk in the woods in reference to coping with stressors. Patient reported patient was unable to locate patient's thought record and stated, I forget things.  Patient reported patient's daughter is primary caregiver and reported daughter's significant other is a support. Patient stated, I'm very fortunate that I have the support  that I do.   Interventions: Cognitive Behavioral Therapy. Clinician conducted session via caregility video for the entirety of session and via telephone for audio for 36 minutes from clinician's home office due to patient's audio component of caregility not working. Patient provided verbal consent to proceed with telehealth session and is aware of limitations of telephone or video visits. Patient participated in session from patient's home. Reviewed events since last session and assessed for changes. Discussed patient's experience during recent trip. Clarified patient's statement regarding patient's personality and memory. Discussed patient's thoughts related to patient's health/diagnosis and the impact on patient's response/coping strategies to health related stressors. Reviewed patient's thought record and barriers to completing thought record. Provided psycho education related stressors and Alzheimer's Disease.    Collaboration of Care: Other not required at this time   Diagnosis:  Anxiety disorder, unspecified type   Plan: Patient is to utilize [employer] Therapy, thought re-framing, behavioral activation, and coping strategies to decrease symptoms associated with their diagnosis. Frequency: bi-weekly  Modality: individual      Long-term goal:   Reduce overall level, frequency, and intensity of the feelings of anxiety as evidenced by decrease in forgetfulness, feeling anxious, difficulty focusing, and withdrawal from activities from 7 days/week to 0 to 1 days/week per patient report for at least 3 consecutive months. Target Date: 04/18/25  Progress: progressing    Short-term goal:  Implement strategies to increase concentration/focus  Target Date: 04/18/25  Progress: progressing    Develop and implement strategies to increase patient's participation in social activities Target Date: 04/18/25  Progress: progressing     Carolyn Seats,  LCSW

## 2024-11-14 ENCOUNTER — Ambulatory Visit: Admitting: Clinical

## 2024-11-14 DIAGNOSIS — F419 Anxiety disorder, unspecified: Secondary | ICD-10-CM

## 2024-11-14 NOTE — Progress Notes (Signed)
"     Behavioral Health Counselor/Therapist Progress Note  Patient ID: Carolyn Cordova, MRN: [id number],    Date: 11/14/2024  Time Spent: 9:36am - 10:16am : 40 minutes   Treatment Type: Individual Therapy  Reported Symptoms: ruminating thoughts, worry  Mental Status Exam: Appearance:  Neat and Well Groomed     Behavior: Appropriate  Motor: Normal  Speech/Language:  Clear and Coherent and Normal Rate  Affect: Appropriate  Mood: Patient stated, I feel like I'm pretty good today in response to current mood  Thought process: normal  Thought content:   WNL  Sensory/Perceptual disturbances:   WNL  Orientation: oriented to person, place, time/date, and situation  Attention: Good  Concentration: Good  Memory: Patient deferred to daughter at times during session for recall of information  Fund of knowledge:  Good  Insight:   Good  Judgment:  Good  Impulse Control: Good   Risk Assessment: Danger to Self:  No Patient denied current suicidal ideation  Self-injurious Behavior: No Danger to Others: No Patient denied current homicidal ideation Duty to Warn:no Physical Aggression / Violence:No  Access to Firearms a concern: No  Gang Involvement:No   Subjective: Patient stated, about the same in response to events since last session. Patient stated, the thought process and my way of thinking before and now is different and stated, it's stressful at times. Patient stated, I think I over think things at times and stated, I'll keep thinking about it. Patient reported feeling anxious when experiencing ruminating thoughts. [name] reported patient becomes fixated in reference to thoughts and reported patient experiences worry as a result. [name] and patient reported conversations with patient's mother is a trigger for ruminating thoughts and worry. Patient reported patient enjoys drawing and painting. Patient stated, this is relaxing to me, this is  soothing to me in reference to painting. During today's session patient displayed several of patient's paintings.  Patient reported walking daily and stated, its soothing. Patient stated, I can try it in response to grounding exercises.    Interventions: Cognitive Behavioral Therapy. Clinician conducted session via caregility video from clinician's home office. Patient provided verbal consent to proceed with telehealth session and is aware of limitations of telephone or video visits. Patient participated in session from patient's home. [name] was present during today's session and patient provided verbal consent for daughter to be present during today's session. Explored current stressors. Provided psycho education related to anxiety, ruminating thoughts, cognitive distortions. Explored and identified triggers for anxiety/worry. Discussed strategies to decrease ruminating thoughts/worry. Provided psycho education related to grounding exercises.   Collaboration of Care: Other not required at this time   Diagnosis:  Anxiety disorder, unspecified type   Plan: Patient is to utilize [employer] [facility], thought re-framing, behavioral activation, and coping strategies to decrease symptoms associated with their diagnosis. Frequency: bi-weekly  Modality: individual      Long-term goal:   Reduce overall level, frequency, and intensity of the feelings of anxiety as evidenced by decrease in forgetfulness, feeling anxious, difficulty focusing, and withdrawal from activities from 7 days/week to 0 to 1 days/week per patient report for at least 3 consecutive months. Target Date: 04/18/25  Progress: progressing    Short-term goal:  Implement strategies to increase concentration/focus  Target Date: 04/18/25  Progress: progressing    Develop and implement strategies to increase patient's participation in social activities Target Date: 04/18/25  Progress: progressing       [name],  LCSW    "

## 2024-11-14 NOTE — Progress Notes (Signed)
   [name], LCSW

## 2024-12-09 ENCOUNTER — Ambulatory Visit: Payer: Self-pay | Admitting: Clinical

## 2024-12-09 DIAGNOSIS — F419 Anxiety disorder, unspecified: Secondary | ICD-10-CM

## 2024-12-09 NOTE — Progress Notes (Signed)
   [name], LCSW

## 2024-12-09 NOTE — Progress Notes (Signed)
"    [name] Counselor/Therapist Progress Note  Patient ID: Carolyn Cordova, MRN: [id number],    Date: 12/09/2024  Time Spent: 9:34am - 10:18am : 44 minutes  Treatment Type: Individual Therapy  Reported Symptoms: Patient reported patient is forgetful.   Mental Status Exam: Appearance:  Neat and Well Groomed     Behavior: Appropriate  Motor: Normal  Speech/Language:  Clear and Coherent and Normal Rate  Affect: Appropriate  Mood: Patient stated, I'm good in response to current mood  Thought process: normal  Thought content:   WNL  Sensory/Perceptual disturbances:   WNL  Orientation: oriented to person, place, and situation  Attention: Good  Concentration: Good  Memory: Patient reported patient is forgetful  Fund of knowledge:  Good  Insight:   Good  Judgment:  Good  Impulse Control: Good   Risk Assessment: Danger to Self:  No Patient denied current suicidal ideation  Self-injurious Behavior: No Danger to Others: No Patient denied current homicidal ideation Duty to Warn:no Physical Aggression / Violence:No  Access to Firearms a concern: No  Gang Involvement:No   Subjective: Patient stated, its ok in response to events since last session. Patient stated, I still get frustrated with myself because I forget things, other than that I'm good in response to patient's mood since last session. Patient stated, I'm good in response to current mood. Patient reported sitting on patient's porch and stated, I like sitting on the porch looking around. Patient reported patient has been crafting, painting, going for walks, visits with mother. Patient stated, I bounce from one thing to the other, to keep me busy. Patient stated, I'm as laid back as I've been my whole life. Patient stated, Im adapting the best I can. Patient reported the following adaptations in response to changes associated with Alzheimer's Disease. Patient reported patient is no longer driving  and stated, I'm not as independent as I was, I have never been dependent like I am now, its just a change, I just go with whatever it takes, and reported prioritizing taking care of patient's self versus others.    Interventions: Cognitive Behavioral Therapy. Clinician conducted session via caregility video from clinician's home office. Patient provided verbal consent to proceed with telehealth session and is aware of limitations of telephone or video visits. Patient participated in session from patient's home. Reviewed patient's participation in positive activities since last session. Explored thoughts and feelings triggered by changes associated with Alzheimer's Disease diagnosis. Explored and identified adaptations patient has implemented in response to changes/symptoms associated with Alzheimer's Disease.    Collaboration of Care: Other not required at this time   Diagnosis:  Anxiety disorder, unspecified type   Plan: Patient is to utilize [employer] Therapy, thought re-framing, behavioral activation, and coping strategies to decrease symptoms associated with their diagnosis. Frequency: bi-weekly  Modality: individual      Long-term goal:   Reduce overall level, frequency, and intensity of the feelings of anxiety as evidenced by decrease in forgetfulness, feeling anxious, difficulty focusing, and withdrawal from activities from 7 days/week to 0 to 1 days/week per patient report for at least 3 consecutive months. Target Date: 04/18/25  Progress: progressing    Short-term goal:  Implement strategies to increase concentration/focus  Target Date: 04/18/25  Progress: progressing    Develop and implement strategies to increase patient's participation in social activities Target Date: 04/18/25  Progress: progressing       [name], LCSW    "

## 2024-12-22 ENCOUNTER — Other Ambulatory Visit

## 2024-12-22 ENCOUNTER — Ambulatory Visit: Payer: Self-pay | Admitting: Family Medicine

## 2024-12-22 ENCOUNTER — Telehealth: Payer: Self-pay | Admitting: Family Medicine

## 2024-12-22 DIAGNOSIS — I10 Essential (primary) hypertension: Secondary | ICD-10-CM

## 2024-12-22 DIAGNOSIS — R7303 Prediabetes: Secondary | ICD-10-CM

## 2024-12-22 DIAGNOSIS — E538 Deficiency of other specified B group vitamins: Secondary | ICD-10-CM

## 2024-12-22 DIAGNOSIS — E7800 Pure hypercholesterolemia, unspecified: Secondary | ICD-10-CM

## 2024-12-22 LAB — LIPID PANEL
Cholesterol: 229 mg/dL — ABNORMAL HIGH (ref 28–200)
HDL: 56.6 mg/dL
LDL Cholesterol: 147 mg/dL — ABNORMAL HIGH (ref 10–99)
NonHDL: 172
Total CHOL/HDL Ratio: 4
Triglycerides: 125 mg/dL (ref 10.0–149.0)
VLDL: 25 mg/dL (ref 0.0–40.0)

## 2024-12-22 LAB — COMPREHENSIVE METABOLIC PANEL WITH GFR
ALT: 17 U/L (ref 3–35)
AST: 16 U/L (ref 5–37)
Albumin: 4.4 g/dL (ref 3.5–5.2)
Alkaline Phosphatase: 94 U/L (ref 39–117)
BUN: 17 mg/dL (ref 6–23)
CO2: 29 meq/L (ref 19–32)
Calcium: 9.2 mg/dL (ref 8.4–10.5)
Chloride: 103 meq/L (ref 96–112)
Creatinine, Ser: 0.7 mg/dL (ref 0.40–1.20)
GFR: 91.6 mL/min
Glucose, Bld: 79 mg/dL (ref 70–99)
Potassium: 4.4 meq/L (ref 3.5–5.1)
Sodium: 139 meq/L (ref 135–145)
Total Bilirubin: 0.6 mg/dL (ref 0.2–1.2)
Total Protein: 6.5 g/dL (ref 6.0–8.3)

## 2024-12-22 LAB — CBC WITH DIFFERENTIAL/PLATELET
Basophils Absolute: 0.1 10*3/uL (ref 0.0–0.1)
Basophils Relative: 1.9 % (ref 0.0–3.0)
Eosinophils Absolute: 0.4 10*3/uL (ref 0.0–0.7)
Eosinophils Relative: 7.7 % — ABNORMAL HIGH (ref 0.0–5.0)
HCT: 45.7 % (ref 36.0–46.0)
Hemoglobin: 15.1 g/dL — ABNORMAL HIGH (ref 12.0–15.0)
Lymphocytes Relative: 38.4 % (ref 12.0–46.0)
Lymphs Abs: 2.2 10*3/uL (ref 0.7–4.0)
MCHC: 33.1 g/dL (ref 30.0–36.0)
MCV: 93 fl (ref 78.0–100.0)
Monocytes Absolute: 0.5 10*3/uL (ref 0.1–1.0)
Monocytes Relative: 8.1 % (ref 3.0–12.0)
Neutro Abs: 2.5 10*3/uL (ref 1.4–7.7)
Neutrophils Relative %: 43.9 % (ref 43.0–77.0)
Platelets: 260 10*3/uL (ref 150.0–400.0)
RBC: 4.92 Mil/uL (ref 3.87–5.11)
RDW: 13.5 % (ref 11.5–15.5)
WBC: 5.7 10*3/uL (ref 4.0–10.5)

## 2024-12-22 LAB — VITAMIN B12: Vitamin B-12: 1103 pg/mL — ABNORMAL HIGH (ref 211–911)

## 2024-12-22 LAB — HEMOGLOBIN A1C: Hgb A1c MFr Bld: 5.4 % (ref 4.6–6.5)

## 2024-12-22 LAB — TSH: TSH: 1.21 u[IU]/mL (ref 0.35–5.50)

## 2024-12-22 NOTE — Telephone Encounter (Signed)
 Lab orders cpe

## 2024-12-29 ENCOUNTER — Encounter: Admitting: Family Medicine

## 2025-01-06 ENCOUNTER — Ambulatory Visit: Admitting: Clinical

## 2025-05-15 ENCOUNTER — Ambulatory Visit: Admitting: Neurology
